# Patient Record
Sex: Female | Born: 1960 | Race: White | Hispanic: No | Marital: Married | Smoking: Never smoker
Health system: Southern US, Community
[De-identification: ages and names within clinical notes are randomized; demographics above are authoritative.]

## PROBLEM LIST (undated history)

## (undated) DIAGNOSIS — G4730 Sleep apnea, unspecified: Secondary | ICD-10-CM

## (undated) DIAGNOSIS — S82892A Other fracture of left lower leg, initial encounter for closed fracture: Secondary | ICD-10-CM

## (undated) DIAGNOSIS — T7840XA Allergy, unspecified, initial encounter: Secondary | ICD-10-CM

## (undated) DIAGNOSIS — R011 Cardiac murmur, unspecified: Secondary | ICD-10-CM

## (undated) DIAGNOSIS — M8580 Other specified disorders of bone density and structure, unspecified site: Secondary | ICD-10-CM

## (undated) DIAGNOSIS — F419 Anxiety disorder, unspecified: Secondary | ICD-10-CM

## (undated) DIAGNOSIS — J45909 Unspecified asthma, uncomplicated: Secondary | ICD-10-CM

## (undated) DIAGNOSIS — I10 Essential (primary) hypertension: Secondary | ICD-10-CM

## (undated) DIAGNOSIS — B019 Varicella without complication: Secondary | ICD-10-CM

## (undated) DIAGNOSIS — S42409A Unspecified fracture of lower end of unspecified humerus, initial encounter for closed fracture: Secondary | ICD-10-CM

## (undated) DIAGNOSIS — E785 Hyperlipidemia, unspecified: Secondary | ICD-10-CM

## (undated) HISTORY — DX: Sleep apnea, unspecified: G47.30

## (undated) HISTORY — DX: Other fracture of left lower leg, initial encounter for closed fracture: S82.892A

## (undated) HISTORY — DX: Other specified disorders of bone density and structure, unspecified site: M85.80

## (undated) HISTORY — DX: Unspecified fracture of lower end of unspecified humerus, initial encounter for closed fracture: S42.409A

## (undated) HISTORY — PX: ELBOW FRACTURE SURGERY: SHX616

## (undated) HISTORY — PX: WISDOM TOOTH EXTRACTION: SHX21

## (undated) HISTORY — DX: Allergy, unspecified, initial encounter: T78.40XA

## (undated) HISTORY — DX: Hyperlipidemia, unspecified: E78.5

## (undated) HISTORY — DX: Cardiac murmur, unspecified: R01.1

## (undated) HISTORY — DX: Anxiety disorder, unspecified: F41.9

## (undated) HISTORY — PX: COLONOSCOPY: SHX174

## (undated) HISTORY — DX: Varicella without complication: B01.9

## (undated) HISTORY — DX: Essential (primary) hypertension: I10

---

## 2011-02-28 LAB — HM MAMMOGRAPHY

## 2012-12-31 ENCOUNTER — Telehealth: Payer: Self-pay | Admitting: Family Medicine

## 2012-12-31 DIAGNOSIS — Z9109 Other allergy status, other than to drugs and biological substances: Secondary | ICD-10-CM

## 2012-12-31 MED ORDER — MONTELUKAST SODIUM 10 MG PO TABS: 10.0000 mg | ORAL_TABLET | Freq: Every day | ORAL | Status: DC

## 2012-12-31 NOTE — Telephone Encounter (Signed)
Rx for Singular sent to [facility]

## 2012-12-31 NOTE — Telephone Encounter (Signed)
Patient wanted to see if we could refill her Cingular 10mg  once a day, until her New Patient appointment on 02/27/2013. thanks  Pharmacy [facility]

## 2013-02-18 ENCOUNTER — Emergency Department (HOSPITAL_COMMUNITY): Payer: Managed Care, Other (non HMO)

## 2013-02-18 ENCOUNTER — Emergency Department (HOSPITAL_COMMUNITY)
Admission: EM | Admit: 2013-02-18 | Discharge: 2013-02-18 | Disposition: A | Discharge status: Home / Self Care | Payer: Managed Care, Other (non HMO) | Attending: Emergency Medicine | Admitting: Emergency Medicine

## 2013-02-18 ENCOUNTER — Encounter (HOSPITAL_COMMUNITY): Payer: Self-pay | Admitting: Emergency Medicine

## 2013-02-18 DIAGNOSIS — Z79899 Other long term (current) drug therapy: Secondary | ICD-10-CM | POA: Insufficient documentation

## 2013-02-18 DIAGNOSIS — Y929 Unspecified place or not applicable: Secondary | ICD-10-CM | POA: Insufficient documentation

## 2013-02-18 DIAGNOSIS — X500XXA Overexertion from strenuous movement or load, initial encounter: Secondary | ICD-10-CM | POA: Insufficient documentation

## 2013-02-18 DIAGNOSIS — Z880 Allergy status to penicillin: Secondary | ICD-10-CM | POA: Insufficient documentation

## 2013-02-18 DIAGNOSIS — J45909 Unspecified asthma, uncomplicated: Secondary | ICD-10-CM | POA: Insufficient documentation

## 2013-02-18 DIAGNOSIS — IMO0002 Reserved for concepts with insufficient information to code with codable children: Secondary | ICD-10-CM | POA: Insufficient documentation

## 2013-02-18 DIAGNOSIS — Y9301 Activity, walking, marching and hiking: Secondary | ICD-10-CM | POA: Insufficient documentation

## 2013-02-18 DIAGNOSIS — S82892A Other fracture of left lower leg, initial encounter for closed fracture: Secondary | ICD-10-CM

## 2013-02-18 DIAGNOSIS — S8263XA Displaced fracture of lateral malleolus of unspecified fibula, initial encounter for closed fracture: Secondary | ICD-10-CM | POA: Insufficient documentation

## 2013-02-18 HISTORY — DX: Unspecified asthma, uncomplicated: J45.909

## 2013-02-18 MED ORDER — OXYCODONE-ACETAMINOPHEN 5-325 MG PO TABS
1.0000 | ORAL_TABLET | Freq: Once | ORAL | Status: AC
  Administered 2013-02-18: 1 via ORAL
  Filled 2013-02-18: qty 1

## 2013-02-18 MED ORDER — OXYCODONE-ACETAMINOPHEN 5-325 MG PO TABS: 1.0000 | ORAL_TABLET | Freq: Four times a day (QID) | ORAL | Status: DC | PRN

## 2013-02-18 NOTE — ED Provider Notes (Signed)
Medical screening examination/treatment/procedure(s) were performed by non-physician practitioner and as supervising physician I was immediately available for consultation/collaboration.  EKG Interpretation   None         Adyn Serna M Efrat Zuidema, DO 02/18/13 1710 

## 2013-02-18 NOTE — Progress Notes (Signed)
Orthopedic Tech Progress Note Patient Details:  Sandy Green [birth year] [id number] Post. Short leg splint applied to Left LE. Crutches fit for comfort.  Ortho Devices Type of Ortho Device: Crutches;Post (short leg) splint Ortho Device/Splint Interventions: Application   Asia R Thompson 02/18/2013, 3:25 PM

## 2013-02-18 NOTE — ED Notes (Signed)
Pt c/o left ankle pain after tripping and twisting today; CMS intact

## 2013-02-18 NOTE — ED Provider Notes (Signed)
CSN: [id number]     Arrival date & time 02/18/13  1310 History  This chart was scribed for non-physician practitioner Fayrene Helper, PA-C working with Laray Anger, DO by Leone Payor, ED Scribe. This patient was seen in room TR05C/TR05C and the patient's care was started at 1310.    Chief Complaint  Patient presents with  . Ankle Pain    The history is provided by the patient. No language interpreter was used.     HPI Comments: Sandy Green is a 52 y.o. female who presents to the Emergency Department complaining of a left ankle injury that occurred about 3 hours ago. Pt states she was walking when she tripped and twisted her ankle. She denies any other injuries at this time. She has not taken any OTC pain medications for her symptoms. She denies lightheadedness, dizziness, LOC, knee pain.   Past Medical History  Diagnosis Date  . Asthma    History reviewed. No pertinent past surgical history. History reviewed. No pertinent family history. History  Substance Use Topics  . Smoking status: Never Smoker   . Smokeless tobacco: Not on file  . Alcohol Use: No   OB History   Grav Para Term Preterm Abortions TAB SAB Ect Mult Living                 Review of Systems  Musculoskeletal: Positive for arthralgias (left ankle pain).  Neurological: Negative for weakness and numbness.    Allergies  Penicillins  Home Medications   Current Outpatient Rx  Name  Route  Sig  Dispense  Refill  . atorvastatin (LIPITOR) 10 MG tablet   Oral   Take 10 mg by mouth daily.         Marland Kitchen escitalopram (LEXAPRO) 10 MG tablet   Oral   Take 10 mg by mouth daily.         . fluticasone (FLOVENT HFA) 220 MCG/ACT inhaler   Inhalation   Inhale 1 puff into the lungs 2 (two) times daily.         Marland Kitchen loratadine (CLARITIN) 10 MG tablet   Oral   Take 10 mg by mouth daily.         . montelukast (SINGULAIR) 10 MG tablet   Oral   Take 1 tablet (10 mg total) by mouth at bedtime.   30 tablet   1    . Norgestimate-Ethinyl Estradiol Triphasic (ORTHO TRI-CYCLEN LO) 0.18/0.215/0.25 MG-25 MCG tab   Oral   Take 1 tablet by mouth daily.          BP 150/79  Pulse 77  Temp(Src) 98 F (36.7 C) (Oral)  Resp 18  Wt 190 lb (86.183 kg)  SpO2 99% Physical Exam  Nursing note and vitals reviewed. Constitutional: She is oriented to person, place, and time. She appears well-developed and well-nourished.  HENT:  Head: Normocephalic and atraumatic.  Cardiovascular: Normal rate.   Pulmonary/Chest: Effort normal.  Abdominal: She exhibits no distension.  Musculoskeletal:  Left ankle: tenderness to palpation to lateral aspect with moderate swelling. Without obvious deformity. No tenderness to medial or posterior malleolus. No tenderness to the 5th MTP. Pedal pulses intact with brisk cap refill.   Neurological: She is alert and oriented to person, place, and time.  Skin: Skin is warm and dry.  Psychiatric: She has a normal mood and affect.    ED Course  Procedures   DIAGNOSTIC STUDIES: Oxygen Saturation is 99% on RA, normal by my interpretation.    COORDINATION  OF CARE: 2:52 PM Will order XRAY of the left ankle. Discussed treatment plan with pt at bedside and pt agreed to plan.    3:15 PM Xray of L ankle shows nondisplaced transverse fx through the lateral malleolus just below the level of the ankle mortise.  This is a stable closed fracture. NVI. Will place short leg splint, crutches, and close f/u with ortho.  Pain medication given.  Return precaution given.     Labs Review Labs Reviewed - No data to display Imaging Review Dg Ankle Complete Left  02/18/2013   CLINICAL DATA:  Larey Seat.  Injured left ankle.  EXAM: LEFT ANKLE COMPLETE - 3+ VIEW  COMPARISON:  None.  FINDINGS: There is a nondisplaced transverse fracture of the lateral malleolus just below the level of the ankle mortise. The tibia is intact. No talar fractures. No osteochondral abnormality. The visualized mid and hindfoot bony  structures are intact.  IMPRESSION: Nondisplaced transverse fracture through the lateral malleolus just below the level of the ankle mortise.   Electronically Signed   By: Loralie Champagne M.D.   On: 02/18/2013 14:08    EKG Interpretation   None       MDM   1. Closed left ankle fracture, initial encounter    BP 150/79  Pulse 77  Temp(Src) 98 F (36.7 C) (Oral)  Resp 18  Wt 190 lb (86.183 kg)  SpO2 99%  I have reviewed nursing notes and vital signs. I personally reviewed the imaging tests through PACS system  I reviewed available ER/hospitalization records thought the EMR   I personally performed the services described in this documentation, which was scribed in my presence. The recorded information has been reviewed and is accurate.    Fayrene Helper, PA-C 02/18/13 [phone]

## 2013-02-26 ENCOUNTER — Telehealth: Payer: Self-pay

## 2013-02-26 NOTE — Telephone Encounter (Signed)
Left message for call back  identifiable     

## 2013-02-27 ENCOUNTER — Encounter: Payer: Self-pay | Admitting: Family Medicine

## 2013-02-27 ENCOUNTER — Ambulatory Visit (INDEPENDENT_AMBULATORY_CARE_PROVIDER_SITE_OTHER): Payer: Managed Care, Other (non HMO) | Admitting: Family Medicine

## 2013-02-27 VITALS — BP 130/82 | HR 89 | Resp 16 | Ht 66.0 in | Wt 195.8 lb

## 2013-02-27 DIAGNOSIS — E785 Hyperlipidemia, unspecified: Secondary | ICD-10-CM

## 2013-02-27 DIAGNOSIS — J4520 Mild intermittent asthma, uncomplicated: Secondary | ICD-10-CM

## 2013-02-27 DIAGNOSIS — Z1239 Encounter for other screening for malignant neoplasm of breast: Secondary | ICD-10-CM

## 2013-02-27 DIAGNOSIS — J45909 Unspecified asthma, uncomplicated: Secondary | ICD-10-CM

## 2013-02-27 DIAGNOSIS — F411 Generalized anxiety disorder: Secondary | ICD-10-CM

## 2013-02-27 DIAGNOSIS — E669 Obesity, unspecified: Secondary | ICD-10-CM | POA: Insufficient documentation

## 2013-02-27 DIAGNOSIS — Z9109 Other allergy status, other than to drugs and biological substances: Secondary | ICD-10-CM

## 2013-02-27 DIAGNOSIS — Z889 Allergy status to unspecified drugs, medicaments and biological substances status: Secondary | ICD-10-CM

## 2013-02-27 DIAGNOSIS — Z0000 Encounter for general adult medical examination without abnormal findings: Secondary | ICD-10-CM

## 2013-02-27 MED ORDER — ESCITALOPRAM OXALATE 10 MG PO TABS: 10.0000 mg | ORAL_TABLET | Freq: Every day | ORAL | Status: DC

## 2013-02-27 MED ORDER — MONTELUKAST SODIUM 10 MG PO TABS: 10.0000 mg | ORAL_TABLET | Freq: Every day | ORAL | Status: DC

## 2013-02-27 NOTE — Progress Notes (Signed)
Subjective:     Sandy Green is a 52 y.o. female and is here for a comprehensive physical exam. The patient reports problems - fractured L ankle-- sees Dr Eulah Pont.  History   Social History  . Marital Status: Married    Spouse Name: N/A    Number of Children: N/A  . Years of Education: N/A   Occupational History  . Not on file.   Social History Main Topics  . Smoking status: Never Smoker   . Smokeless tobacco: Not on file  . Alcohol Use: No  . Drug Use: No  . Sexual Activity: Yes    Partners: Male   Other Topics Concern  . Not on file   Social History Narrative   Exercise-- no    Health Maintenance  Topic Date Due  . Mammogram  05/22/2012  . Colonoscopy  05/22/2012  . Influenza Vaccine  11/02/2013  . Pap Smear  03/05/2015  . Tetanus/tdap  02/27/2018    The following portions of the patient's history were reviewed and updated as appropriate:  She  has a past medical history of Asthma; Chicken pox; Heart murmur; Hypertension; Hyperlipemia; and Ankle fracture, left. She  does not have any pertinent problems on file. She  has no past surgical history on file. Her family history includes Cancer in her maternal grandmother; Diabetes in her maternal grandmother; Heart disease in her father; Hypertension in her father and mother. She  reports that she has never smoked. She does not have any smokeless tobacco history on file. She reports that she does not drink alcohol or use illicit drugs. She has a current medication list which includes the following prescription(s): atorvastatin, escitalopram, fluticasone, loratadine, montelukast, norethindrone-ethinyl estradiol-iron, and oxycodone-acetaminophen. Current Outpatient Prescriptions on File Prior to Visit  Medication Sig Dispense Refill  . atorvastatin (LIPITOR) 10 MG tablet Take 10 mg by mouth daily.      . fluticasone (FLOVENT HFA) 220 MCG/ACT inhaler Inhale 1 puff into the lungs 2 (two) times daily.      Marland Kitchen loratadine  (CLARITIN) 10 MG tablet Take 10 mg by mouth daily.      Marland Kitchen oxyCODONE-acetaminophen (PERCOCET/ROXICET) 5-325 MG per tablet Take 1 tablet by mouth every 6 (six) hours as needed for severe pain.  15 tablet  0   No current facility-administered medications on file prior to visit.   She is allergic to penicillins and tetracyclines & related..  Review of Systems Review of Systems  Constitutional: Negative for activity change, appetite change and fatigue.  HENT: Negative for hearing loss, congestion, tinnitus and ear discharge.  dentist --due Eyes: Negative for visual disturbance (see optho q1y -- vision corrected to 20/20 with glasses).  Respiratory: Negative for cough, chest tightness and shortness of breath.   Cardiovascular: Negative for chest pain, palpitations and leg swelling.  Gastrointestinal: Negative for abdominal pain, diarrhea, constipation and abdominal distention.  Genitourinary: Negative for urgency, frequency, decreased urine volume and difficulty urinating.  Musculoskeletal: Negative for back pain, arthralgias and gait problem.  Skin: Negative for color change, pallor and rash.  Neurological: Negative for dizziness, light-headedness, numbness and headaches.  Hematological: Negative for adenopathy. Does not bruise/bleed easily.  Psychiatric/Behavioral: Negative for suicidal ideas, confusion, sleep disturbance, self-injury, dysphoric mood, decreased concentration and agitation.       Objective:    BP 130/82  Pulse 89  Resp 16  Ht 5\' 6"  (1.676 m)  Wt 195 lb 12.8 oz (88.814 kg)  BMI 31.62 kg/m2  SpO2 96% General appearance: alert,  cooperative, appears stated age and no distress Head: Normocephalic, without obvious abnormality, atraumatic Eyes: conjunctivae/corneas clear. PERRL, EOM's intact. Fundi benign. Ears: normal TM's and external ear canals both ears Nose: Nares normal. Septum midline. Mucosa normal. No drainage or sinus tenderness. Throat: lips, mucosa, and tongue  normal; teeth and gums normal Neck: no adenopathy, no carotid bruit, no JVD, supple, symmetrical, trachea midline and thyroid not enlarged, symmetric, no tenderness/mass/nodules Back: symmetric, no curvature. ROM normal. No CVA tenderness. Lungs: clear to auscultation bilaterally Breasts: normal appearance, no masses or tenderness Heart: regular rate and rhythm, S1, S2 normal, no murmur, click, rub or gallop Abdomen: soft, non-tender; bowel sounds normal; no masses,  no organomegaly Pelvic: deferred Extremities: fractured L ankle--in boot Pulses: 2+ and symmetric Skin: Skin color, texture, turgor normal. No rashes or lesions Lymph nodes: Cervical, supraclavicular, and axillary nodes normal. Neurologic: Alert and oriented X 3, normal strength and tone. Normal symmetric reflexes. Normal coordination and gait Psych-- no depression, no anxiety      Assessment:    Healthy female exam.      Plan:    ghm utd Check labs See After Visit Summary for Counseling Recommendations

## 2013-02-27 NOTE — Telephone Encounter (Signed)
Unable to reach pre visit.  

## 2013-02-27 NOTE — Patient Instructions (Signed)
Preventive Care for Adults, Female A healthy lifestyle and preventive care can promote health and wellness. Preventive health guidelines for women include the following key practices.  A routine yearly physical is a good way to check with your caregiver about your health and preventive screening. It is a chance to share any concerns and updates on your health, and to receive a thorough exam.  Visit your dentist for a routine exam and preventive care every 6 months. Brush your teeth twice a day and floss once a day. Good oral hygiene prevents tooth decay and gum disease.  The frequency of eye exams is based on your age, health, family medical history, use of contact lenses, and other factors. Follow your caregiver's recommendations for frequency of eye exams.  Eat a healthy diet. Foods like vegetables, fruits, whole grains, low-fat dairy products, and lean protein foods contain the nutrients you need without too many calories. Decrease your intake of foods high in solid fats, added sugars, and salt. Eat the right amount of calories for you.Get information about a proper diet from your caregiver, if necessary.  Regular physical exercise is one of the most important things you can do for your health. Most adults should get at least 150 minutes of moderate-intensity exercise (any activity that increases your heart rate and causes you to sweat) each week. In addition, most adults need muscle-strengthening exercises on 2 or more days a week.  Maintain a healthy weight. The body mass index (BMI) is a screening tool to identify possible weight problems. It provides an estimate of body fat based on height and weight. Your caregiver can help determine your BMI, and can help you achieve or maintain a healthy weight.For adults 20 years and older:  A BMI below 18.5 is considered underweight.  A BMI of 18.5 to 24.9 is normal.  A BMI of 25 to 29.9 is considered overweight.  A BMI of 30 and above is  considered obese.  Maintain normal blood lipids and cholesterol levels by exercising and minimizing your intake of saturated fat. Eat a balanced diet with plenty of fruit and vegetables. Blood tests for lipids and cholesterol should begin at age 20 and be repeated every 5 years. If your lipid or cholesterol levels are high, you are over 50, or you are at high risk for heart disease, you may need your cholesterol levels checked more frequently.Ongoing high lipid and cholesterol levels should be treated with medicines if diet and exercise are not effective.  If you smoke, find out from your caregiver how to quit. If you do not use tobacco, do not start.  Lung cancer screening is recommended for adults aged 55 80 years who are at high risk for developing lung cancer because of a history of smoking. Yearly low-dose computed tomography (CT) is recommended for people who have at least a 30-pack-year history of smoking and are a current smoker or have quit within the past 15 years. A pack year of smoking is smoking an average of 1 pack of cigarettes a day for 1 year (for example: 1 pack a day for 30 years or 2 packs a day for 15 years). Yearly screening should continue until the smoker has stopped smoking for at least 15 years. Yearly screening should also be stopped for people who develop a health problem that would prevent them from having lung cancer treatment.  If you are pregnant, do not drink alcohol. If you are breastfeeding, be very cautious about drinking alcohol. If you are   not pregnant and choose to drink alcohol, do not exceed 1 drink per day. One drink is considered to be 12 ounces (355 mL) of beer, 5 ounces (148 mL) of wine, or 1.5 ounces (44 mL) of liquor.  Avoid use of street drugs. Do not share needles with anyone. Ask for help if you need support or instructions about stopping the use of drugs.  High blood pressure causes heart disease and increases the risk of stroke. Your blood pressure  should be checked at least every 1 to 2 years. Ongoing high blood pressure should be treated with medicines if weight loss and exercise are not effective.  If you are 55 to 50 years old, ask your caregiver if you should take aspirin to prevent strokes.  Diabetes screening involves taking a blood sample to check your fasting blood sugar level. This should be done once every 3 years, after age 45, if you are within normal weight and without risk factors for diabetes. Testing should be considered at a younger age or be carried out more frequently if you are overweight and have at least 1 risk factor for diabetes.  Breast cancer screening is essential preventive care for women. You should practice "breast self-awareness." This means understanding the normal appearance and feel of your breasts and may include breast self-examination. Any changes detected, no matter how small, should be reported to a caregiver. Women in their 20s and 30s should have a clinical breast exam (CBE) by a caregiver as part of a regular health exam every 1 to 3 years. After age 40, women should have a CBE every year. Starting at age 40, women should consider having a mammography (breast X-ray test) every year. Women who have a family history of breast cancer should talk to their caregiver about genetic screening. Women at a high risk of breast cancer should talk to their caregivers about having magnetic resonance imaging (MRI) and a mammography every year.  Breast cancer gene (BRCA)-related cancer risk assessment is recommended for women who have family members with BRCA-related cancers. BRCA-related cancers include breast, ovarian, tubal, and peritoneal cancers. Having family members with these cancers may be associated with an increased risk for harmful changes (mutations) in the breast cancer genes BRCA1 and BRCA2. Results of the assessment will determine the need for genetic counseling and BRCA1 and BRCA2 testing.  The Pap test is  a screening test for cervical cancer. A Pap test can show cell changes on the cervix that might become cervical cancer if left untreated. A Pap test is a procedure in which cells are obtained and examined from the lower end of the uterus (cervix).  Women should have a Pap test starting at age 21.  Between ages 21 and 29, Pap tests should be repeated every 2 years.  Beginning at age 30, you should have a Pap test every 3 years as long as the past 3 Pap tests have been normal.  Some women have medical problems that increase the chance of getting cervical cancer. Talk to your caregiver about these problems. It is especially important to talk to your caregiver if a new problem develops soon after your last Pap test. In these cases, your caregiver may recommend more frequent screening and Pap tests.  The above recommendations are the same for women who have or have not gotten the vaccine for human papillomavirus (HPV).  If you had a hysterectomy for a problem that was not cancer or a condition that could lead to cancer, then   you no longer need Pap tests. Even if you no longer need a Pap test, a regular exam is a good idea to make sure no other problems are starting.  If you are between ages 65 and 70, and you have had normal Pap tests going back 10 years, you no longer need Pap tests. Even if you no longer need a Pap test, a regular exam is a good idea to make sure no other problems are starting.  If you have had past treatment for cervical cancer or a condition that could lead to cancer, you need Pap tests and screening for cancer for at least 20 years after your treatment.  If Pap tests have been discontinued, risk factors (such as a new sexual partner) need to be reassessed to determine if screening should be resumed.  The HPV test is an additional test that may be used for cervical cancer screening. The HPV test looks for the virus that can cause the cell changes on the cervix. The cells collected  during the Pap test can be tested for HPV. The HPV test could be used to screen women aged 30 years and older, and should be used in women of any age who have unclear Pap test results. After the age of 30, women should have HPV testing at the same frequency as a Pap test.  Colorectal cancer can be detected and often prevented. Most routine colorectal cancer screening begins at the age of 50 and continues through age 75. However, your caregiver may recommend screening at an earlier age if you have risk factors for colon cancer. On a yearly basis, your caregiver may provide home test kits to check for hidden blood in the stool. Use of a small camera at the end of a tube, to directly examine the colon (sigmoidoscopy or colonoscopy), can detect the earliest forms of colorectal cancer. Talk to your caregiver about this at age 50, when routine screening begins. Direct examination of the colon should be repeated every 5 to 10 years through age 75, unless early forms of pre-cancerous polyps or small growths are found.  Hepatitis C blood testing is recommended for all people born from 1945 through 1965 and any individual with known risks for hepatitis C.  Practice safe sex. Use condoms and avoid high-risk sexual practices to reduce the spread of sexually transmitted infections (STIs). STIs include gonorrhea, chlamydia, syphilis, trichomonas, herpes, HPV, and human immunodeficiency virus (HIV). Herpes, HIV, and HPV are viral illnesses that have no cure. They can result in disability, cancer, and death. Sexually active women aged 25 and younger should be checked for chlamydia. Older women with new or multiple partners should also be tested for chlamydia. Testing for other STIs is recommended if you are sexually active and at increased risk.  Osteoporosis is a disease in which the bones lose minerals and strength with aging. This can result in serious bone fractures. The risk of osteoporosis can be identified using a  bone density scan. Women ages 65 and over and women at risk for fractures or osteoporosis should discuss screening with their caregivers. Ask your caregiver whether you should take a calcium supplement or vitamin D to reduce the rate of osteoporosis.  Menopause can be associated with physical symptoms and risks. Hormone replacement therapy is available to decrease symptoms and risks. You should talk to your caregiver about whether hormone replacement therapy is right for you.  Use sunscreen. Apply sunscreen liberally and repeatedly throughout the day. You should seek shade   when your shadow is shorter than you. Protect yourself by wearing long sleeves, pants, a wide-brimmed hat, and sunglasses year round, whenever you are outdoors.  Once a month, do a whole body skin exam, using a mirror to look at the skin on your back. Notify your caregiver of new moles, moles that have irregular borders, moles that are larger than a pencil eraser, or moles that have changed in shape or color.  Stay current with required immunizations.  Influenza vaccine. All adults should be immunized every year.  Tetanus, diphtheria, and acellular pertussis (Td, Tdap) vaccine. Pregnant women should receive 1 dose of Tdap vaccine during each pregnancy. The dose should be obtained regardless of the length of time since the last dose. Immunization is preferred during the 27th to 36th week of gestation. An adult who has not previously received Tdap or who does not know her vaccine status should receive 1 dose of Tdap. This initial dose should be followed by tetanus and diphtheria toxoids (Td) booster doses every 10 years. Adults with an unknown or incomplete history of completing a 3-dose immunization series with Td-containing vaccines should begin or complete a primary immunization series including a Tdap dose. Adults should receive a Td booster every 10 years.  Varicella vaccine. An adult without evidence of immunity to varicella  should receive 2 doses or a second dose if she has previously received 1 dose. Pregnant females who do not have evidence of immunity should receive the first dose after pregnancy. This first dose should be obtained before leaving the health care facility. The second dose should be obtained 4 8 weeks after the first dose.  Human papillomavirus (HPV) vaccine. Females aged 13 26 years who have not received the vaccine previously should obtain the 3-dose series. The vaccine is not recommended for use in pregnant females. However, pregnancy testing is not needed before receiving a dose. If a female is found to be pregnant after receiving a dose, no treatment is needed. In that case, the remaining doses should be delayed until after the pregnancy. Immunization is recommended for any person with an immunocompromised condition through the age of 26 years if she did not get any or all doses earlier. During the 3-dose series, the second dose should be obtained 4 8 weeks after the first dose. The third dose should be obtained 24 weeks after the first dose and 16 weeks after the second dose.  Zoster vaccine. One dose is recommended for adults aged 60 years or older unless certain conditions are present.  Measles, mumps, and rubella (MMR) vaccine. Adults born before 1957 generally are considered immune to measles and mumps. Adults born in 1957 or later should have 1 or more doses of MMR vaccine unless there is a contraindication to the vaccine or there is laboratory evidence of immunity to each of the three diseases. A routine second dose of MMR vaccine should be obtained at least 28 days after the first dose for students attending postsecondary schools, health care workers, or international travelers. People who received inactivated measles vaccine or an unknown type of measles vaccine during 1963 1967 should receive 2 doses of MMR vaccine. People who received inactivated mumps vaccine or an unknown type of mumps vaccine  before 1979 and are at high risk for mumps infection should consider immunization with 2 doses of MMR vaccine. For females of childbearing age, rubella immunity should be determined. If there is no evidence of immunity, females who are not pregnant should be vaccinated. If there   is no evidence of immunity, females who are pregnant should delay immunization until after pregnancy. Unvaccinated health care workers born before 1957 who lack laboratory evidence of measles, mumps, or rubella immunity or laboratory confirmation of disease should consider measles and mumps immunization with 2 doses of MMR vaccine or rubella immunization with 1 dose of MMR vaccine.  Pneumococcal 13-valent conjugate (PCV13) vaccine. When indicated, a person who is uncertain of her immunization history and has no record of immunization should receive the PCV13 vaccine. An adult aged 19 years or older who has certain medical conditions and has not been previously immunized should receive 1 dose of PCV13 vaccine. This PCV13 should be followed with a dose of pneumococcal polysaccharide (PPSV23) vaccine. The PPSV23 vaccine dose should be obtained at least 8 weeks after the dose of PCV13 vaccine. An adult aged 19 years or older who has certain medical conditions and previously received 1 or more doses of PPSV23 vaccine should receive 1 dose of PCV13. The PCV13 vaccine dose should be obtained 1 or more years after the last PPSV23 vaccine dose.  Pneumococcal polysaccharide (PPSV23) vaccine. When PCV13 is also indicated, PCV13 should be obtained first. All adults aged 65 years and older should be immunized. An adult younger than age 65 years who has certain medical conditions should be immunized. Any person who resides in a nursing home or long-term care facility should be immunized. An adult smoker should be immunized. People with an immunocompromised condition and certain other conditions should receive both PCV13 and PPSV23 vaccines. People  with human immunodeficiency virus (HIV) infection should be immunized as soon as possible after diagnosis. Immunization during chemotherapy or radiation therapy should be avoided. Routine use of PPSV23 vaccine is not recommended for American Indians, [place] Natives, or people younger than 65 years unless there are medical conditions that require PPSV23 vaccine. When indicated, people who have unknown immunization and have no record of immunization should receive PPSV23 vaccine. One-time revaccination 5 years after the first dose of PPSV23 is recommended for people aged 19 64 years who have chronic kidney failure, nephrotic syndrome, asplenia, or immunocompromised conditions. People who received 1 2 doses of PPSV23 before age 65 years should receive another dose of PPSV23 vaccine at age 65 years or later if at least 5 years have passed since the previous dose. Doses of PPSV23 are not needed for people immunized with PPSV23 at or after age 65 years.  Meningococcal vaccine. Adults with asplenia or persistent complement component deficiencies should receive 2 doses of quadrivalent meningococcal conjugate (MenACWY-D) vaccine. The doses should be obtained at least 2 months apart. Microbiologists working with certain meningococcal bacteria, military recruits, people at risk during an outbreak, and people who travel to or live in countries with a high rate of meningitis should be immunized. A first-year college student up through age 21 years who is living in a residence hall should receive a dose if she did not receive a dose on or after her 16th birthday. Adults who have certain high-risk conditions should receive one or more doses of vaccine.  Hepatitis A vaccine. Adults who wish to be protected from this disease, have certain high-risk conditions, work with hepatitis A-infected animals, work in hepatitis A research labs, or travel to or work in countries with a high rate of hepatitis A should be immunized. Adults  who were previously unvaccinated and who anticipate close contact with an international adoptee during the first 60 days after arrival in the [place] from a country   with a high rate of hepatitis A should be immunized.  Hepatitis B vaccine. Adults who wish to be protected from this disease, have certain high-risk conditions, may be exposed to blood or other infectious body fluids, are household contacts or sex partners of hepatitis B positive people, are clients or workers in certain care facilities, or travel to or work in countries with a high rate of hepatitis B should be immunized.  Haemophilus influenzae type b (Hib) vaccine. A previously unvaccinated person with asplenia or sickle cell disease or having a scheduled splenectomy should receive 1 dose of Hib vaccine. Regardless of previous immunization, a recipient of a hematopoietic stem cell transplant should receive a 3-dose series 6 12 months after her successful transplant. Hib vaccine is not recommended for adults with HIV infection. Preventive Services / Frequency Ages 19 to 39  Blood pressure check.** / Every 1 to 2 years.  Lipid and cholesterol check.** / Every 5 years beginning at age 20.  Clinical breast exam.** / Every 3 years for women in their 20s and 30s.  BRCA-related cancer risk assessment.** / For women who have family members with a BRCA-related cancer (breast, ovarian, tubal, or peritoneal cancers).  Pap test.** / Every 2 years from ages 21 through 29. Every 3 years starting at age 30 through age 65 or 70 with a history of 3 consecutive normal Pap tests.  HPV screening.** / Every 3 years from ages 30 through ages 65 to 70 with a history of 3 consecutive normal Pap tests.  Hepatitis C blood test.** / For any individual with known risks for hepatitis C.  Skin self-exam. / Monthly.  Influenza vaccine. / Every year.  Tetanus, diphtheria, and acellular pertussis (Tdap, Td) vaccine.** / Consult your caregiver. Pregnant  women should receive 1 dose of Tdap vaccine during each pregnancy. 1 dose of Td every 10 years.  Varicella vaccine.** / Consult your caregiver. Pregnant females who do not have evidence of immunity should receive the first dose after pregnancy.  HPV vaccine. / 3 doses over 6 months, if 26 and younger. The vaccine is not recommended for use in pregnant females. However, pregnancy testing is not needed before receiving a dose.  Measles, mumps, rubella (MMR) vaccine.** / You need at least 1 dose of MMR if you were born in 1957 or later. You may also need a 2nd dose. For females of childbearing age, rubella immunity should be determined. If there is no evidence of immunity, females who are not pregnant should be vaccinated. If there is no evidence of immunity, females who are pregnant should delay immunization until after pregnancy.  Pneumococcal 13-valent conjugate (PCV13) vaccine.** / Consult your caregiver.  Pneumococcal polysaccharide (PPSV23) vaccine.** / 1 to 2 doses if you smoke cigarettes or if you have certain conditions.  Meningococcal vaccine.** / 1 dose if you are age 19 to 21 years and a first-year college student living in a residence hall, or have one of several medical conditions, you need to get vaccinated against meningococcal disease. You may also need additional booster doses.  Hepatitis A vaccine.** / Consult your caregiver.  Hepatitis B vaccine.** / Consult your caregiver.  Haemophilus influenzae type b (Hib) vaccine.** / Consult your caregiver. Ages 40 to 64  Blood pressure check.** / Every 1 to 2 years.  Lipid and cholesterol check.** / Every 5 years beginning at age 20.  Lung cancer screening. / Every year if you are aged 55 80 years and have a 30-pack-year history of smoking and   currently smoke or have quit within the past 15 years. Yearly screening is stopped once you have quit smoking for at least 15 years or develop a health problem that would prevent you from having  lung cancer treatment.  Clinical breast exam.** / Every year after age 40.  BRCA-related cancer risk assessment.** / For women who have family members with a BRCA-related cancer (breast, ovarian, tubal, or peritoneal cancers).  Mammogram.** / Every year beginning at age 40 and continuing for as long as you are in good health. Consult with your caregiver.  Pap test.** / Every 3 years starting at age 30 through age 65 or 70 with a history of 3 consecutive normal Pap tests.  HPV screening.** / Every 3 years from ages 30 through ages 65 to 70 with a history of 3 consecutive normal Pap tests.  Fecal occult blood test (FOBT) of stool. / Every year beginning at age 50 and continuing until age 75. You may not need to do this test if you get a colonoscopy every 10 years.  Flexible sigmoidoscopy or colonoscopy.** / Every 5 years for a flexible sigmoidoscopy or every 10 years for a colonoscopy beginning at age 50 and continuing until age 75.  Hepatitis C blood test.** / For all people born from 1945 through 1965 and any individual with known risks for hepatitis C.  Skin self-exam. / Monthly.  Influenza vaccine. / Every year.  Tetanus, diphtheria, and acellular pertussis (Tdap/Td) vaccine.** / Consult your caregiver. Pregnant women should receive 1 dose of Tdap vaccine during each pregnancy. 1 dose of Td every 10 years.  Varicella vaccine.** / Consult your caregiver. Pregnant females who do not have evidence of immunity should receive the first dose after pregnancy.  Zoster vaccine.** / 1 dose for adults aged 60 years or older.  Measles, mumps, rubella (MMR) vaccine.** / You need at least 1 dose of MMR if you were born in 1957 or later. You may also need a 2nd dose. For females of childbearing age, rubella immunity should be determined. If there is no evidence of immunity, females who are not pregnant should be vaccinated. If there is no evidence of immunity, females who are pregnant should delay  immunization until after pregnancy.  Pneumococcal 13-valent conjugate (PCV13) vaccine.** / Consult your caregiver.  Pneumococcal polysaccharide (PPSV23) vaccine.** / 1 to 2 doses if you smoke cigarettes or if you have certain conditions.  Meningococcal vaccine.** / Consult your caregiver.  Hepatitis A vaccine.** / Consult your caregiver.  Hepatitis B vaccine.** / Consult your caregiver.  Haemophilus influenzae type b (Hib) vaccine.** / Consult your caregiver. Ages 65 and over  Blood pressure check.** / Every 1 to 2 years.  Lipid and cholesterol check.** / Every 5 years beginning at age 20.  Lung cancer screening. / Every year if you are aged 55 80 years and have a 30-pack-year history of smoking and currently smoke or have quit within the past 15 years. Yearly screening is stopped once you have quit smoking for at least 15 years or develop a health problem that would prevent you from having lung cancer treatment.  Clinical breast exam.** / Every year after age 40.  BRCA-related cancer risk assessment.** / For women who have family members with a BRCA-related cancer (breast, ovarian, tubal, or peritoneal cancers).  Mammogram.** / Every year beginning at age 40 and continuing for as long as you are in good health. Consult with your caregiver.  Pap test.** / Every 3 years starting at age   30 through age 65 or 70 with a 3 consecutive normal Pap tests. Testing can be stopped between 65 and 70 with 3 consecutive normal Pap tests and no abnormal Pap or HPV tests in the past 10 years.  HPV screening.** / Every 3 years from ages 30 through ages 65 or 70 with a history of 3 consecutive normal Pap tests. Testing can be stopped between 65 and 70 with 3 consecutive normal Pap tests and no abnormal Pap or HPV tests in the past 10 years.  Fecal occult blood test (FOBT) of stool. / Every year beginning at age 50 and continuing until age 75. You may not need to do this test if you get a colonoscopy  every 10 years.  Flexible sigmoidoscopy or colonoscopy.** / Every 5 years for a flexible sigmoidoscopy or every 10 years for a colonoscopy beginning at age 50 and continuing until age 75.  Hepatitis C blood test.** / For all people born from 1945 through 1965 and any individual with known risks for hepatitis C.  Osteoporosis screening.** / A one-time screening for women ages 65 and over and women at risk for fractures or osteoporosis.  Skin self-exam. / Monthly.  Influenza vaccine. / Every year.  Tetanus, diphtheria, and acellular pertussis (Tdap/Td) vaccine.** / 1 dose of Td every 10 years.  Varicella vaccine.** / Consult your caregiver.  Zoster vaccine.** / 1 dose for adults aged 60 years or older.  Pneumococcal 13-valent conjugate (PCV13) vaccine.** / Consult your caregiver.  Pneumococcal polysaccharide (PPSV23) vaccine.** / 1 dose for all adults aged 65 years and older.  Meningococcal vaccine.** / Consult your caregiver.  Hepatitis A vaccine.** / Consult your caregiver.  Hepatitis B vaccine.** / Consult your caregiver.  Haemophilus influenzae type b (Hib) vaccine.** / Consult your caregiver. ** Family history and personal history of risk and conditions may change your caregiver's recommendations. Document Released: 05/17/2001 Document Revised: 07/16/2012 Document Reviewed: 08/16/2010 Exit[employer] Patient Information 2014 [employer].  

## 2013-02-27 NOTE — Progress Notes (Signed)
Pre visit review using our clinic review tool, if applicable. No additional management support is needed unless otherwise documented below in the visit note. 

## 2013-02-28 DIAGNOSIS — E785 Hyperlipidemia, unspecified: Secondary | ICD-10-CM | POA: Insufficient documentation

## 2013-02-28 DIAGNOSIS — Z889 Allergy status to unspecified drugs, medicaments and biological substances status: Secondary | ICD-10-CM | POA: Insufficient documentation

## 2013-02-28 DIAGNOSIS — J4520 Mild intermittent asthma, uncomplicated: Secondary | ICD-10-CM | POA: Insufficient documentation

## 2013-02-28 NOTE — Assessment & Plan Note (Signed)
Check labs con't meds 

## 2013-02-28 NOTE — Assessment & Plan Note (Signed)
con't meds stable 

## 2013-04-12 ENCOUNTER — Ambulatory Visit
Admission: RE | Admit: 2013-04-12 | Discharge: 2013-04-12 | Disposition: A | Discharge status: Home / Self Care | Payer: Managed Care, Other (non HMO) | Source: Ambulatory Visit | Attending: Family Medicine | Admitting: Family Medicine

## 2013-04-12 DIAGNOSIS — Z1239 Encounter for other screening for malignant neoplasm of breast: Secondary | ICD-10-CM

## 2013-04-18 ENCOUNTER — Encounter: Payer: Self-pay | Admitting: Family Medicine

## 2013-04-24 ENCOUNTER — Telehealth: Payer: Self-pay | Admitting: Family Medicine

## 2013-04-24 NOTE — Telephone Encounter (Signed)
Patient needs her Microgestin Rx refilled to [facility] on [address]. Please advise.

## 2013-04-29 MED ORDER — NORETHIN ACE-ETH ESTRAD-FE 1.5-30 MG-MCG PO TABS: 1.0000 | ORAL_TABLET | Freq: Every day | ORAL | Status: DC

## 2013-04-29 NOTE — Telephone Encounter (Signed)
Rx sent      KP 

## 2013-06-21 ENCOUNTER — Other Ambulatory Visit: Payer: Self-pay | Admitting: Family Medicine

## 2013-07-21 ENCOUNTER — Other Ambulatory Visit: Payer: Self-pay | Admitting: Family Medicine

## 2013-08-20 ENCOUNTER — Other Ambulatory Visit: Payer: Self-pay | Admitting: Family Medicine

## 2013-08-27 ENCOUNTER — Ambulatory Visit (INDEPENDENT_AMBULATORY_CARE_PROVIDER_SITE_OTHER): Payer: Managed Care, Other (non HMO) | Admitting: Family Medicine

## 2013-08-27 ENCOUNTER — Encounter: Payer: Self-pay | Admitting: Family Medicine

## 2013-08-27 VITALS — BP 118/72 | HR 84 | Temp 98.3°F | Ht 66.0 in | Wt 193.0 lb

## 2013-08-27 DIAGNOSIS — F419 Anxiety disorder, unspecified: Secondary | ICD-10-CM

## 2013-08-27 DIAGNOSIS — F411 Generalized anxiety disorder: Secondary | ICD-10-CM

## 2013-08-27 DIAGNOSIS — E785 Hyperlipidemia, unspecified: Secondary | ICD-10-CM

## 2013-08-27 DIAGNOSIS — J45909 Unspecified asthma, uncomplicated: Secondary | ICD-10-CM

## 2013-08-27 DIAGNOSIS — Z0000 Encounter for general adult medical examination without abnormal findings: Secondary | ICD-10-CM

## 2013-08-27 LAB — HEPATIC FUNCTION PANEL
ALBUMIN: 3.1 g/dL — AB (ref 3.5–5.2)
ALT: 15 U/L (ref 0–35)
AST: 20 U/L (ref 0–37)
Alkaline Phosphatase: 57 U/L (ref 39–117)
Bilirubin, Direct: 0.1 mg/dL (ref 0.0–0.3)
TOTAL PROTEIN: 6.9 g/dL (ref 6.0–8.3)
Total Bilirubin: 0.9 mg/dL (ref 0.2–1.2)

## 2013-08-27 LAB — LIPID PANEL
Cholesterol: 268 mg/dL — ABNORMAL HIGH (ref 0–200)
HDL: 55.4 mg/dL (ref >39.00)
LDL Cholesterol: 194 mg/dL — ABNORMAL HIGH (ref 0–99)
TRIGLYCERIDES: 95 mg/dL (ref 0.0–149.0)
Total CHOL/HDL Ratio: 5
VLDL: 19 mg/dL (ref 0.0–40.0)

## 2013-08-27 LAB — CBC WITH DIFFERENTIAL/PLATELET
BASOS PCT: 0.7 % (ref 0.0–3.0)
Basophils Absolute: 0.1 10*3/uL (ref 0.0–0.1)
Eosinophils Absolute: 0.3 10*3/uL (ref 0.0–0.7)
Eosinophils Relative: 3 % (ref 0.0–5.0)
HEMATOCRIT: 40.2 % (ref 36.0–46.0)
HEMOGLOBIN: 13.4 g/dL (ref 12.0–15.0)
Lymphocytes Relative: 31.2 % (ref 12.0–46.0)
Lymphs Abs: 2.8 10*3/uL (ref 0.7–4.0)
MCHC: 33.3 g/dL (ref 30.0–36.0)
MCV: 92.2 fl (ref 78.0–100.0)
MONO ABS: 0.7 10*3/uL (ref 0.1–1.0)
Monocytes Relative: 7.9 % (ref 3.0–12.0)
NEUTROS ABS: 5.2 10*3/uL (ref 1.4–7.7)
NEUTROS PCT: 57.2 % (ref 43.0–77.0)
Platelets: 310 10*3/uL (ref 150.0–400.0)
RBC: 4.36 Mil/uL (ref 3.87–5.11)
RDW: 13.8 % (ref 11.5–15.5)
WBC: 9.1 10*3/uL (ref 4.0–10.5)

## 2013-08-27 LAB — URINALYSIS
Hgb urine dipstick: NEGATIVE
KETONES UR: NEGATIVE
LEUKOCYTES UA: NEGATIVE
Nitrite: NEGATIVE
Total Protein, Urine: NEGATIVE
Urine Glucose: NEGATIVE
Urobilinogen, UA: 0.2 (ref 0.0–1.0)
pH: 6 (ref 5.0–8.0)

## 2013-08-27 LAB — BASIC METABOLIC PANEL
BUN: 14 mg/dL (ref 6–23)
CHLORIDE: 106 meq/L (ref 96–112)
CO2: 27 meq/L (ref 19–32)
CREATININE: 1.1 mg/dL (ref 0.4–1.2)
Calcium: 8.6 mg/dL (ref 8.4–10.5)
GFR: 55.02 mL/min — ABNORMAL LOW (ref >60.00)
Glucose, Bld: 85 mg/dL (ref 70–99)
Potassium: 3.9 mEq/L (ref 3.5–5.1)
Sodium: 140 mEq/L (ref 135–145)

## 2013-08-27 LAB — TSH: TSH: 1.74 u[IU]/mL (ref 0.35–4.50)

## 2013-08-27 MED ORDER — ATORVASTATIN CALCIUM 10 MG PO TABS: 10.0000 mg | ORAL_TABLET | Freq: Every day | ORAL | Status: DC

## 2013-08-27 MED ORDER — MONTELUKAST SODIUM 10 MG PO TABS: ORAL_TABLET | ORAL | Status: DC

## 2013-08-27 MED ORDER — ESCITALOPRAM OXALATE 10 MG PO TABS: ORAL_TABLET | ORAL | Status: DC

## 2013-08-27 NOTE — Progress Notes (Signed)
   Subjective:    Patient ID: Sandy Green, female    DOB: [birth year], 53 y.o.   MRN: [id number]  HPI  Pt here to f/u bp and anxiety and she also states her husband said she has started snoring but her allergies have been bad lately.  She never had a problem in the past.  No other complaints.  Review of Systems As above    Objective:   Physical Exam BP 118/72  Pulse 84  Temp(Src) 98.3 F (36.8 C) (Oral)  Ht 5\' 6"  (1.676 m)  Wt 193 lb (87.544 kg)  BMI 31.17 kg/m2  SpO2 97% General appearance: alert, cooperative, appears stated age and no distress Ears: normal TM's and external ear canals both ears Nose: Nares normal. Septum midline. Mucosa normal. No drainage or sinus tenderness. Throat: lips, mucosa, and tongue normal; teeth and gums normal Neck: no adenopathy, no carotid bruit, no JVD, supple, symmetrical, trachea midline and thyroid not enlarged, symmetric, no tenderness/mass/nodules Lungs: clear to auscultation bilaterally Heart: regular rate and rhythm, S1, S2 normal, no murmur, click, rub or gallop       Assessment & Plan:  1. Other and unspecified hyperlipidemia Check labs- pt has been off med about 2 weeks - atorvastatin (LIPITOR) 10 MG tablet; Take 1 tablet (10 mg total) by mouth daily.  Dispense: 90 tablet; Refill: 3  2. Anxiety stable - escitalopram (LEXAPRO) 10 MG tablet; TAKE 1 TABLET (10 MG TOTAL) BY MOUTH DAILY.  Dispense: 90 tablet; Refill: 3  3. Asthma Refill meds - montelukast (SINGULAIR) 10 MG tablet; TAKE 1 TABLET BY MOUTH AT BEDTIME  Dispense: 90 tablet; Refill: 3  4.  htn--- stable

## 2013-08-27 NOTE — Progress Notes (Signed)
Pre visit review using our clinic review tool, if applicable. No additional management support is needed unless otherwise documented below in the visit note. 

## 2013-08-27 NOTE — Patient Instructions (Signed)

## 2013-08-27 NOTE — Addendum Note (Signed)
Addended by: Modena Morrow D on: 08/27/2013 09:50 AM   Modules accepted: Orders

## 2014-02-15 ENCOUNTER — Emergency Department (HOSPITAL_COMMUNITY)
Admission: EM | Admit: 2014-02-15 | Discharge: 2014-02-15 | Disposition: A | Discharge status: Home / Self Care | Payer: Managed Care, Other (non HMO) | Source: Home / Self Care | Attending: Emergency Medicine | Admitting: Emergency Medicine

## 2014-02-15 ENCOUNTER — Encounter (HOSPITAL_COMMUNITY): Payer: Self-pay | Admitting: *Deleted

## 2014-02-15 DIAGNOSIS — J069 Acute upper respiratory infection, unspecified: Secondary | ICD-10-CM

## 2014-02-15 LAB — POCT RAPID STREP A: STREPTOCOCCUS, GROUP A SCREEN (DIRECT): NEGATIVE

## 2014-02-15 MED ORDER — AZITHROMYCIN 250 MG PO TABS: ORAL_TABLET | ORAL | Status: DC

## 2014-02-15 MED ORDER — IPRATROPIUM BROMIDE 0.06 % NA SOLN: 2.0000 | Freq: Four times a day (QID) | NASAL | Status: DC

## 2014-02-15 MED ORDER — HYDROCOD POLST-CHLORPHEN POLST 10-8 MG/5ML PO LQCR: 5.0000 mL | Freq: Two times a day (BID) | ORAL | Status: DC | PRN

## 2014-02-15 NOTE — ED Notes (Signed)
Pt  Reports   Symptoms  Of     Congestion         Cough        Symptoms         Of      Uri            Sinus   Drainage

## 2014-02-15 NOTE — Discharge Instructions (Signed)

## 2014-02-15 NOTE — ED Provider Notes (Signed)
Chief Complaint   URI   History of Present Illness   Sandy Green is a 53 year old female who has had a 2 day history of sore throat, headache, nasal congestion with clear rhinorrhea, bilateral ear congestion, and cough productive of clear sputum. She denies any fever, chills, wheezing, shortness of breath, chest pain, or GI symptoms. She has had no known sick exposures.  Review of Systems   Other than as noted above, the patient denies any of the following symptoms: Systemic:  No fevers, chills, sweats, or myalgias. Eye:  No redness or discharge. ENT:  No ear pain, headache, nasal congestion, drainage, sinus pressure, or sore throat. Neck:  No neck pain, stiffness, or swollen glands. Lungs:  No cough, sputum production, hemoptysis, wheezing, chest tightness, shortness of breath or chest pain. GI:  No abdominal pain, nausea, vomiting or diarrhea.  [place]   Past medical history, family history, social history, meds, and allergies were reviewed. She is allergic to penicillin and tetracycline. She takes Lipitor, Lexapro, Flovent, Claritin, Singulair, and birth control pills. She has a history of asthma, hypertension, and hyperlipidemia.  Physical exam   Vital signs:  BP 142/97 mmHg  Pulse 92  Temp(Src) 97.7 F (36.5 C) (Oral)  Resp 20  SpO2 96% General:  Alert and oriented.  In no distress.  Skin warm and dry. Eye:  No conjunctival injection or drainage. Lids were normal. ENT:  TMs and canals were normal, without erythema or inflammation.  Nasal mucosa was clear and uncongested, without drainage.  Mucous membranes were moist.  Pharynx was clear with no exudate or drainage.  There were no oral ulcerations or lesions. Neck:  Supple, no adenopathy, tenderness or mass. Lungs:  No respiratory distress.  Lungs were clear to auscultation, without wheezes, rales or rhonchi.  Breath sounds were clear and equal bilaterally.  Heart:  Regular rhythm, without gallops, murmers or rubs. Skin:   Clear, warm, and dry, without rash or lesions.  Labs   Results for orders placed or performed during the hospital encounter of 02/15/14  POCT rapid strep A [facility])  Result Value Ref Range   Streptococcus, Group A Screen (Direct) NEGATIVE NEGATIVE    Assessment     The encounter diagnosis was Viral URI.  There is no evidence of pneumonia, strep throat, sinusitis, otitis media.    Plan    1.  Meds:  The following meds were prescribed:   Discharge Medication List as of 02/15/2014 10:57 AM    START taking these medications   Details  azithromycin (ZITHROMAX Z-PAK) 250 MG tablet Take as directed., Print    chlorpheniramine-HYDROcodone (TUSSIONEX) 10-8 MG/5ML LQCR Take 5 mLs by mouth every 12 (twelve) hours as needed for cough., Starting 02/15/2014, Until Discontinued, Normal    ipratropium (ATROVENT) 0.06 % nasal spray Place 2 sprays into both nostrils 4 (four) times daily., Starting 02/15/2014, Until Discontinued, Normal        2.  Patient Education/Counseling:  The patient was given appropriate handouts, self care instructions, and instructed in symptomatic relief.  Instructed to get extra fluids and extra rest.  The patient was told not to get the prescription for antibiotic filled unless her respiratory symptoms had persisted for more than 7 to 10 days.  3.  Follow up:  The patient was told to follow up here if no better in 3 to 4 days, or sooner if becoming worse in any way, and given some red flag symptoms such as increasing fever, difficulty breathing, chest  pain, or persistent vomiting which would prompt immediate return.       Harden Mo, MD 02/15/14 [phone]

## 2014-02-17 LAB — CULTURE, GROUP A STREP

## 2014-03-03 ENCOUNTER — Ambulatory Visit (INDEPENDENT_AMBULATORY_CARE_PROVIDER_SITE_OTHER): Payer: Managed Care, Other (non HMO) | Admitting: Family Medicine

## 2014-03-03 ENCOUNTER — Encounter: Payer: Self-pay | Admitting: Family Medicine

## 2014-03-03 VITALS — BP 132/90 | HR 80 | Temp 97.7°F | Wt 189.4 lb

## 2014-03-03 DIAGNOSIS — E785 Hyperlipidemia, unspecified: Secondary | ICD-10-CM

## 2014-03-03 LAB — HEPATIC FUNCTION PANEL
ALBUMIN: 3.1 g/dL — AB (ref 3.5–5.2)
ALT: 18 U/L (ref 0–35)
AST: 22 U/L (ref 0–37)
Alkaline Phosphatase: 60 U/L (ref 39–117)
Bilirubin, Direct: 0.2 mg/dL (ref 0.0–0.3)
TOTAL PROTEIN: 6.9 g/dL (ref 6.0–8.3)
Total Bilirubin: 1.1 mg/dL (ref 0.2–1.2)

## 2014-03-03 LAB — LIPID PANEL
Cholesterol: 202 mg/dL — ABNORMAL HIGH (ref 0–200)
HDL: 50.8 mg/dL (ref >39.00)
LDL Cholesterol: 134 mg/dL — ABNORMAL HIGH (ref 0–99)
NONHDL: 151.2
TRIGLYCERIDES: 85 mg/dL (ref 0.0–149.0)
Total CHOL/HDL Ratio: 4
VLDL: 17 mg/dL (ref 0.0–40.0)

## 2014-03-03 LAB — BASIC METABOLIC PANEL
BUN: 16 mg/dL (ref 6–23)
CALCIUM: 8.4 mg/dL (ref 8.4–10.5)
CO2: 25 mEq/L (ref 19–32)
CREATININE: 1 mg/dL (ref 0.4–1.2)
Chloride: 104 mEq/L (ref 96–112)
GFR: 63.4 mL/min (ref >60.00)
GLUCOSE: 84 mg/dL (ref 70–99)
Potassium: 3.6 mEq/L (ref 3.5–5.1)
Sodium: 136 mEq/L (ref 135–145)

## 2014-03-03 NOTE — Progress Notes (Signed)
   Subjective:    Patient ID: Sandy Green, female    DOB: 05-20-1960, 53 y.o.   MRN: [id number]  HPI Pt here to f/u on her cholesterol.  No complaints.     Review of Systems    as above Objective:   Physical Exam  BP 132/90 mmHg  Pulse 80  Temp(Src) 97.7 F (36.5 C) (Oral)  Wt 189 lb 6.4 oz (85.911 kg)  SpO2 97% General appearance: alert, cooperative, appears stated age and no distress Neck: no adenopathy, supple, symmetrical, trachea midline and thyroid not enlarged, symmetric, no tenderness/mass/nodules Lungs: clear to auscultation bilaterally Heart: S1, S2 normal Extremities: extremities normal, atraumatic, no cyanosis or edema      Assessment & Plan:  1. Hyperlipidemia Check labs, con't meds - Basic metabolic panel - Hepatic function panel - Lipid panel  2.  Pt refused flu shot

## 2014-03-03 NOTE — Progress Notes (Signed)
Pre visit review using our clinic review tool, if applicable. No additional management support is needed unless otherwise documented below in the visit note. 

## 2014-03-03 NOTE — Patient Instructions (Signed)

## 2014-03-06 DIAGNOSIS — E785 Hyperlipidemia, unspecified: Secondary | ICD-10-CM

## 2014-03-06 MED ORDER — ATORVASTATIN CALCIUM 20 MG PO TABS: 20.0000 mg | ORAL_TABLET | Freq: Every day | ORAL | Status: DC

## 2014-03-06 MED ORDER — NORETHIN ACE-ETH ESTRAD-FE 1.5-30 MG-MCG PO TABS: 1.0000 | ORAL_TABLET | Freq: Every day | ORAL | Status: DC

## 2014-04-07 ENCOUNTER — Encounter: Payer: Self-pay | Admitting: Family Medicine

## 2014-04-07 NOTE — Telephone Encounter (Signed)
Pt needs ov. 

## 2014-04-09 ENCOUNTER — Encounter: Payer: Self-pay | Admitting: Medical

## 2014-04-09 ENCOUNTER — Ambulatory Visit (INDEPENDENT_AMBULATORY_CARE_PROVIDER_SITE_OTHER): Payer: Managed Care, Other (non HMO) | Admitting: Medical

## 2014-04-09 VITALS — BP 147/99 | HR 78 | Temp 98.5°F | Ht 66.0 in | Wt 188.6 lb

## 2014-04-09 DIAGNOSIS — H109 Unspecified conjunctivitis: Secondary | ICD-10-CM | POA: Insufficient documentation

## 2014-04-09 MED ORDER — TOBRAMYCIN 0.3 % OP SOLN: 2.0000 [drp] | Freq: Four times a day (QID) | OPHTHALMIC | Status: DC

## 2014-04-09 NOTE — Assessment & Plan Note (Signed)
You appear to have probable mild allergies/allergic conjunctivitis with some possible recent early rt bacterial conjunctivitis. I would advise using otc antihistamine for itching whichever one you think helped the most  and I am prescribing tobrex eye drops. If your eye is worsening. No improvement by tomorrow afternoon please let us know as that would afford [place] some time before weekend to possibly get you in with optometrist if needed.  On exam you have small pimple like area rt cheek. I want you to note if these area start cropping up around eye or any vesicles. If such were to occur then notify us and would need antiviral medication.(Let us know immediately if such were to occur.

## 2014-04-09 NOTE — Progress Notes (Signed)
Subjective:    Patient ID: Sandy Green, female    DOB: Jan 03, 1961, 54 y.o.   MRN: [id number]  HPI   Pt has rt eye itchy for weeks on and off x 4-6 wks.. Then since Sunday night occasionally will wipe and get filmy discharge. On Monday morning her lids were stuck together. Pt not working and no  Contact with kids. No known contacts with pink eye recently. No trauma to eye. No rash or lesions around her eye. No vesicles reported. No light sensitivity. No flashing to eye. No pressure sensation. Occasional lt eye itching.  Past Medical History  Diagnosis Date  . Asthma   . Chicken pox   . Heart murmur   . Hypertension   . Hyperlipemia   . Ankle fracture, left     History   Social History  . Marital Status: Married    Spouse Name: N/A    Number of Children: N/A  . Years of Education: N/A   Occupational History  . Not on file.   Social History Main Topics  . Smoking status: Never Smoker   . Smokeless tobacco: Not on file  . Alcohol Use: No  . Drug Use: No  . Sexual Activity:    Partners: Male   Other Topics Concern  . Not on file   Social History Narrative   Exercise-- no     No past surgical history on file.  Family History  Problem Relation Age of Onset  . Diabetes Maternal Grandmother   . Cancer Maternal Grandmother     Unsure of kind? Breast  . Hypertension Mother   . Hypertension Father   . Heart disease Father     cabg.6    Allergies  Allergen Reactions  . Penicillins Other (See Comments)    Reaction unknown  . Tetracyclines & Related Nausea Only and Rash    Current Outpatient Prescriptions on File Prior to Visit  Medication Sig Dispense Refill  . atorvastatin (LIPITOR) 20 MG tablet Take 1 tablet (20 mg total) by mouth daily. 90 tablet 1  . escitalopram (LEXAPRO) 10 MG tablet TAKE 1 TABLET (10 MG TOTAL) BY MOUTH DAILY. 90 tablet 3  . fluticasone (FLOVENT HFA) 220 MCG/ACT inhaler Inhale 1 puff into the lungs 2 (two) times daily.    Marland Kitchen loratadine  (CLARITIN) 10 MG tablet Take 10 mg by mouth daily.    . montelukast (SINGULAIR) 10 MG tablet TAKE 1 TABLET BY MOUTH AT BEDTIME 90 tablet 3  . norethindrone-ethinyl estradiol-iron (MICROGESTIN FE,GILDESS FE,LOESTRIN FE) 1.5-30 MG-MCG tablet Take 1 tablet by mouth daily. 3 Package 4   No current facility-administered medications on file prior to visit.    BP 147/99 mmHg  Pulse 78  Temp(Src) 98.5 F (36.9 C) (Oral)  Ht 5\' 6"  (1.676 m)  Wt 188 lb 9.6 oz (85.548 kg)  BMI 30.46 kg/m2  SpO2 97%     Review of Systems  Constitutional: Negative for fever, chills and fatigue.  HENT: Negative for congestion, ear pain, rhinorrhea, sinus pressure and sneezing.   Eyes: Positive for discharge, redness and itching. Negative for pain and visual disturbance.       Rt eye red when itches. Transient blurred vision with filmy discharge and only transient. None now.  Cardiovascular: Negative for chest pain and palpitations.  Musculoskeletal: Negative for back pain.  Skin: Negative for rash.  Neurological: Negative for facial asymmetry, speech difficulty, light-headedness and headaches.  Hematological: Negative for adenopathy. Does not bruise/bleed easily.  Objective:   Physical Exam   .General  Mental Status - Alert. General Appearance - Well groomed. Not in acute distress.  Skin Rashes- No Rashes. One small pimple on rt cheek. And another on rt side forehead below scalp line.  HEENT Head- Normal. Ear Auditory Canal - Left- Normal. Right - Normal.Tympanic Membrane- Left- Normal. Right- Normal. Eye Sclera/Conjunctiva- Left- Normal. Right- Faint injected conjunctiva. No obvious discharge. Both eyes 20/30, Lt eye 20/70. Rt eye 20/40. Nose & Sinuses Nasal Mucosa- Left-  Mild boggy or Congested. Right- mild boggy or Congested. Mouth & Throat Lips: Upper Lip- Normal: no dryness, cracking, pallor, cyanosis, or vesicular eruption. Lower Lip-Normal: no dryness, cracking, pallor, cyanosis or  vesicular eruption. Buccal Mucosa- Bilateral- No Aphthous ulcers. Oropharynx- No Discharge or Erythema. Tonsils: Characteristics- Bilateral- No Erythema or Congestion. Size/Enlargement- Bilateral- No enlargement. Discharge- bilateral-None.  Neck Neck- Supple. No Masses.   Chest and Lung Exam Auscultation: Breath Sounds:- even and unlabored, but bilateral upper lobe rhonchi.  Cardiovascular Auscultation:Rythm- Regular, rate and rhythm. Murmurs & Other Heart Sounds:Ausculatation of the heart reveal- No Murmurs.  Lymphatic Head & Neck General Head & Neck Lymphatics: Bilateral: Description- No Localized lymphadenopathy.         Assessment & Plan:

## 2014-04-09 NOTE — Progress Notes (Signed)
Pre visit review using our clinic review tool, if applicable. No additional management support is needed unless otherwise documented below in the visit note. 

## 2014-04-09 NOTE — Patient Instructions (Addendum)
You appear to have probable mild allergies/allergic conjunctivitis with some possible recent early rt bacterial conjunctivitis. I would advise using otc antihistamine for itching whichever one you think helped the most  and I am prescribing tobrex eye drops. If your eye is worsening. No improvement by tomorrow afternoon please let us know as that would afford [place] some time before weekend to possibly get you in with optometrist if needed.  On exam you have small pimple like area rt cheek. I want you to note if these area start cropping up around eye or any vesicles. If such were to occur then notify us and would need antiviral medication.(Let us know immediately if such were to occur.  Follow up in 2-5 days or as needed.

## 2014-05-03 ENCOUNTER — Emergency Department (HOSPITAL_COMMUNITY)
Admission: EM | Admit: 2014-05-03 | Discharge: 2014-05-03 | Disposition: A | Discharge status: Home / Self Care | Payer: Managed Care, Other (non HMO) | Source: Home / Self Care | Attending: Emergency Medicine | Admitting: Emergency Medicine

## 2014-05-03 ENCOUNTER — Encounter (HOSPITAL_COMMUNITY): Payer: Self-pay | Admitting: *Deleted

## 2014-05-03 ENCOUNTER — Emergency Department (INDEPENDENT_AMBULATORY_CARE_PROVIDER_SITE_OTHER): Payer: Managed Care, Other (non HMO)

## 2014-05-03 DIAGNOSIS — J209 Acute bronchitis, unspecified: Secondary | ICD-10-CM

## 2014-05-03 MED ORDER — PREDNISONE 20 MG PO TABS: ORAL_TABLET | ORAL | Status: DC

## 2014-05-03 MED ORDER — HYDROCOD POLST-CHLORPHEN POLST 10-8 MG/5ML PO LQCR: 5.0000 mL | Freq: Two times a day (BID) | ORAL | Status: DC | PRN

## 2014-05-03 MED ORDER — ALBUTEROL SULFATE HFA 108 (90 BASE) MCG/ACT IN AERS: 1.0000 | INHALATION_SPRAY | Freq: Four times a day (QID) | RESPIRATORY_TRACT | Status: DC | PRN

## 2014-05-03 NOTE — Discharge Instructions (Signed)

## 2014-05-03 NOTE — ED Notes (Signed)
Pt  Reports     Of   Cough   Congested    -  Sinus  Drainage  /  Pressure       X   1  Week      Pt    Reports   Symptoms  Not  releived  By  otc  meds

## 2014-05-03 NOTE — ED Provider Notes (Signed)
Chief Complaint   URI   History of Present Illness   Sandy Green is a 54 year old female who's had a one-week history of paroxysmal cough productive of clear to yellow sputum, postnasal drip, sore throat, and chills. She denies any fever or GI symptoms. Her husband has been sick with similar symptoms.  Review of Systems   Other than as noted above, the patient denies any of the following symptoms: Systemic:  No fevers, chills, sweats, or myalgias. Eye:  No redness or discharge. ENT:  No ear pain, headache, nasal congestion, drainage, sinus pressure, or sore throat. Neck:  No neck pain, stiffness, or swollen glands. Lungs:  No cough, sputum production, hemoptysis, wheezing, chest tightness, shortness of breath or chest pain. GI:  No abdominal pain, nausea, vomiting or diarrhea.  [place]   Past medical history, family history, social history, meds, and allergies were reviewed. She is allergic to penicillin and tetracycline. She has asthma and takes Singulair, lisinopril, and Flonase. She also has high blood pressure.  Physical exam   Vital signs:  BP 151/94 mmHg  Pulse 81  Temp(Src) 98.5 F (36.9 C) (Oral)  Resp 16  SpO2 98%  LMP 04/26/2014 (LMP Unknown) General:  Alert and oriented.  In no distress.  Skin warm and dry. Eye:  No conjunctival injection or drainage. Lids were normal. ENT:  TMs and canals were normal, without erythema or inflammation.  Nasal mucosa was clear and uncongested, without drainage.  Mucous membranes were moist.  Pharynx was clear with no exudate or drainage.  There were no oral ulcerations or lesions. Neck:  Supple, no adenopathy, tenderness or mass. Lungs:  No respiratory distress.  Lungs were clear to auscultation, without wheezes, rales or rhonchi.  Breath sounds were clear and equal bilaterally.  Heart:  Regular rhythm, without gallops, murmers or rubs. Skin:  Clear, warm, and dry, without rash or lesions.   Radiology   Dg Chest 2  View  05/03/2014   CLINICAL DATA:  Cough x2 weeks  EXAM: CHEST - 2 VIEW  COMPARISON:  None available  FINDINGS: Lungs are clear, mildly hyperinflated. Heart size and mediastinal contours are within normal limits. No effusion. Visualized skeletal structures are unremarkable.  IMPRESSION: No acute cardiopulmonary disease.   Electronically Signed   By: Arne Cleveland M.D.   On: 05/03/2014 12:33   Assessment     The encounter diagnosis was Acute bronchitis, unspecified organism.  There is no evidence of pneumonia, strep throat, sinusitis, otitis media.    Plan    1.  Meds:  The following meds were prescribed:   Discharge Medication List as of 05/03/2014 12:57 PM    START taking these medications   Details  albuterol (PROVENTIL HFA;VENTOLIN HFA) 108 (90 BASE) MCG/ACT inhaler Inhale 1-2 puffs into the lungs every 6 (six) hours as needed for wheezing or shortness of breath., Starting 05/03/2014, Until Discontinued, Normal    chlorpheniramine-HYDROcodone (TUSSIONEX) 10-8 MG/5ML LQCR Take 5 mLs by mouth every 12 (twelve) hours as needed for cough., Starting 05/03/2014, Until Discontinued, Normal    predniSONE (DELTASONE) 20 MG tablet Take 3 daily for 5 days, 2 daily for 5 days, 1 daily for 5 days., Normal        2.  Patient Education/Counseling:  The patient was given appropriate handouts, self care instructions, and instructed in symptomatic relief.  Instructed to get extra fluids and extra rest.    3.  Follow up:  The patient was told to follow up here if no  better in 3 to 4 days, or sooner if becoming worse in any way, and given some red flag symptoms such as increasing fever, difficulty breathing, chest pain, or persistent vomiting which would prompt immediate return.       Harden Mo, MD 05/03/14 [phone]

## 2014-05-28 ENCOUNTER — Encounter (HOSPITAL_COMMUNITY): Payer: Self-pay | Admitting: *Deleted

## 2014-05-28 ENCOUNTER — Encounter: Payer: Self-pay | Admitting: Family Medicine

## 2014-05-28 ENCOUNTER — Emergency Department (HOSPITAL_COMMUNITY)
Admission: EM | Admit: 2014-05-28 | Discharge: 2014-05-29 | Disposition: A | Discharge status: Home / Self Care | Payer: Managed Care, Other (non HMO) | Attending: Emergency Medicine | Admitting: Emergency Medicine

## 2014-05-28 DIAGNOSIS — R040 Epistaxis: Secondary | ICD-10-CM

## 2014-05-28 DIAGNOSIS — Z8781 Personal history of (healed) traumatic fracture: Secondary | ICD-10-CM | POA: Insufficient documentation

## 2014-05-28 DIAGNOSIS — Z8619 Personal history of other infectious and parasitic diseases: Secondary | ICD-10-CM | POA: Diagnosis not present

## 2014-05-28 DIAGNOSIS — Z79899 Other long term (current) drug therapy: Secondary | ICD-10-CM | POA: Insufficient documentation

## 2014-05-28 DIAGNOSIS — Z880 Allergy status to penicillin: Secondary | ICD-10-CM | POA: Insufficient documentation

## 2014-05-28 DIAGNOSIS — Z7951 Long term (current) use of inhaled steroids: Secondary | ICD-10-CM | POA: Insufficient documentation

## 2014-05-28 DIAGNOSIS — I10 Essential (primary) hypertension: Secondary | ICD-10-CM | POA: Insufficient documentation

## 2014-05-28 DIAGNOSIS — Z7952 Long term (current) use of systemic steroids: Secondary | ICD-10-CM | POA: Insufficient documentation

## 2014-05-28 DIAGNOSIS — R011 Cardiac murmur, unspecified: Secondary | ICD-10-CM | POA: Diagnosis not present

## 2014-05-28 DIAGNOSIS — E785 Hyperlipidemia, unspecified: Secondary | ICD-10-CM | POA: Diagnosis not present

## 2014-05-28 DIAGNOSIS — J45909 Unspecified asthma, uncomplicated: Secondary | ICD-10-CM | POA: Diagnosis not present

## 2014-05-28 NOTE — ED Provider Notes (Signed)
CSN: [id number]     Arrival date & time 05/28/14  2203 History  This chart was scribed for non-physician practitioner, Hollace Kinnier. Threasa Alpha, PA-C working with Threasa Beards, MD by Tula Nakayama, ED scribe. This patient was seen in room TR05C/TR05C and the patient's care was started at 11:28 PM  Chief Complaint  Patient presents with  . Epistaxis   The history is provided by the patient. No language interpreter was used.   HPI Comments: Sandy Green is a 54 y.o. female with a history of HTN and hyperlipidemia who presents to the Emergency Department after a moderate nosebleed that occurred 2 hours ago, but is currently resolved. She notes bleeding was controlled by applying pressure. The onset of her symptoms occurred while she was at rest. Pt has a history of HTN, but does not take BP medications because of prior adverse reactions. She denies a history of DM and anti-coagulant use. Pt also denies frequent bruising as an associated symptom.   PCP [place] Past Medical History  Diagnosis Date  . Asthma   . Chicken pox   . Heart murmur   . Hypertension   . Hyperlipemia   . Ankle fracture, left    History reviewed. No pertinent past surgical history. Family History  Problem Relation Age of Onset  . Diabetes Maternal Grandmother   . Cancer Maternal Grandmother     Unsure of kind? Breast  . Hypertension Mother   . Hypertension Father   . Heart disease Father     cabg.6   History  Substance Use Topics  . Smoking status: Never Smoker   . Smokeless tobacco: Never Used  . Alcohol Use: No   OB History    No data available     Review of Systems  HENT: Positive for nosebleeds.   Hematological: Does not bruise/bleed easily.  All other systems reviewed and are negative.     Allergies  Penicillins and Tetracyclines & related  Home Medications   Prior to Admission medications   Medication Sig Start Date End Date Taking? Authorizing Provider  albuterol (PROVENTIL HFA;VENTOLIN HFA)  108 (90 BASE) MCG/ACT inhaler Inhale 1-2 puffs into the lungs every 6 (six) hours as needed for wheezing or shortness of breath. 05/03/14   Harden Mo, MD  atorvastatin (LIPITOR) 20 MG tablet Take 1 tablet (20 mg total) by mouth daily. 03/06/14   Rosalita Chessman, DO  chlorpheniramine-HYDROcodone (TUSSIONEX) 10-8 MG/5ML LQCR Take 5 mLs by mouth every 12 (twelve) hours as needed for cough. 05/03/14   Harden Mo, MD  escitalopram (LEXAPRO) 10 MG tablet TAKE 1 TABLET (10 MG TOTAL) BY MOUTH DAILY. 08/27/13   Alferd Apa [place]ne, DO  fluticasone (FLOVENT HFA) 220 MCG/ACT inhaler Inhale 1 puff into the lungs 2 (two) times daily.    Historical Provider, MD  loratadine (CLARITIN) 10 MG tablet Take 10 mg by mouth daily.    Historical Provider, MD  montelukast (SINGULAIR) 10 MG tablet TAKE 1 TABLET BY MOUTH AT BEDTIME 08/27/13   Rosalita Chessman, DO  norethindrone-ethinyl estradiol-iron (MICROGESTIN FE,GILDESS FE,LOESTRIN FE) 1.5-30 MG-MCG tablet Take 1 tablet by mouth daily. 03/06/14   Rosalita Chessman, DO  predniSONE (DELTASONE) 20 MG tablet Take 3 daily for 5 days, 2 daily for 5 days, 1 daily for 5 days. 05/03/14   Harden Mo, MD  tobramycin (TOBREX) 0.3 % ophthalmic solution Place 2 drops into the right eye every 6 (six) hours. 04/09/14   Meriam Sprague Saguier, PA-C  BP 172/101 mmHg  Pulse 94  Temp(Src) 98.3 F (36.8 C) (Oral)  Resp 18  Ht 5\' 6"  (1.676 m)  Wt 186 lb (84.369 kg)  BMI 30.04 kg/m2  SpO2 96%  LMP 04/26/2014 (LMP Unknown) Physical Exam  Constitutional: She appears well-developed and well-nourished. No distress.  HENT:  Head: Normocephalic and atraumatic.  Dilated blood vessels left anterior nare; small ulcerated area  Eyes: Conjunctivae and EOM are normal.  Neck: Neck supple. No tracheal deviation present.  Cardiovascular: Normal rate and regular rhythm.   Elevated BP  Pulmonary/Chest: Effort normal. No respiratory distress.  Skin: Skin is warm and dry.  Psychiatric: She has a normal mood  and affect. Her behavior is normal.  Nursing note and vitals reviewed.   ED Course  Procedures  DIAGNOSTIC STUDIES: Oxygen Saturation is 96% on RA, normal by my interpretation.    COORDINATION OF CARE: 11:32 PM Discussed treatment plan with pt at bedside and pt agreed to plan.  Labs Review Labs Reviewed - No data to display  Imaging Review No results found.   EKG Interpretation None      MDM   Final diagnoses:  Nosebleed    Pt counseled on blood pressure.  Pt advised to see her Md for recheck of blood pressure.  Pt counseled on blood pressure and nosebleeds.    [place], PA-C 05/29/14 [place], MD 05/29/14 [phone]

## 2014-05-28 NOTE — ED Notes (Signed)
Pt reports nosebleed at 2100 today, which has stopped bleeding at this time.  Pt reports large amount of bright red blood.

## 2014-05-28 NOTE — ED Notes (Signed)
Patient presents stating she was watching TV and her nose started bleeding.  States it was from the left side.  In triage no bleeding noted.

## 2014-05-29 ENCOUNTER — Encounter: Payer: Self-pay | Admitting: Family Medicine

## 2014-05-29 ENCOUNTER — Ambulatory Visit (INDEPENDENT_AMBULATORY_CARE_PROVIDER_SITE_OTHER): Payer: Managed Care, Other (non HMO) | Admitting: Family Medicine

## 2014-05-29 VITALS — BP 147/86 | HR 90 | Temp 98.4°F | Wt 189.4 lb

## 2014-05-29 DIAGNOSIS — I10 Essential (primary) hypertension: Secondary | ICD-10-CM

## 2014-05-29 DIAGNOSIS — R079 Chest pain, unspecified: Secondary | ICD-10-CM

## 2014-05-29 MED ORDER — VALSARTAN 80 MG PO TABS: 80.0000 mg | ORAL_TABLET | Freq: Every day | ORAL | Status: DC

## 2014-05-29 NOTE — Progress Notes (Signed)
Pre visit review using our clinic review tool, if applicable. No additional management support is needed unless otherwise documented below in the visit note. 

## 2014-05-29 NOTE — Discharge Instructions (Signed)
Nosebleed Nosebleeds can be caused by many conditions, including trauma, infections, polyps, foreign bodies, dry mucous membranes or climate, medicines, and air conditioning. Most nosebleeds occur in the front of the nose. Because of this location, most nosebleeds can be controlled by pinching the nostrils gently and continuously for at least 10 to 20 minutes. The long, continuous pressure allows enough time for the blood to clot. If pressure is released during that 10 to 20 minute time period, the process may have to be started again. The nosebleed may stop by itself or quit with pressure, or it may need concentrated heating (cautery) or pressure from packing. HOME CARE INSTRUCTIONS   If your nose was packed, try to maintain the pack inside until your health care provider removes it. If a gauze pack was used and it starts to fall out, gently replace it or cut the end off. Do not cut if a balloon catheter was used to pack the nose. Otherwise, do not remove unless instructed.  Avoid blowing your nose for 12 hours after treatment. This could dislodge the pack or clot and start the bleeding again.  If the bleeding starts again, sit up and bend forward, gently pinching the front half of your nose continuously for 20 minutes.  If bleeding was caused by dry mucous membranes, use over-the-counter saline nasal spray or gel. This will keep the mucous membranes moist and allow them to heal. If you must use a lubricant, choose the water-soluble variety. Use it only sparingly and not within several hours of lying down.  Do not use petroleum jelly or mineral oil, as these may drip into the lungs and cause serious problems.  Maintain humidity in your home by using less air conditioning or by using a humidifier.  Do not use aspirin or medicines which make bleeding more likely. Your health care provider can give you recommendations on this.  Resume normal activities as you are able, but try to avoid straining,  lifting, or bending at the waist for several days.  If the nosebleeds become recurrent and the cause is unknown, your health care provider may suggest laboratory tests. SEEK MEDICAL CARE IF: You have a fever. SEEK IMMEDIATE MEDICAL CARE IF:   Bleeding recurs and cannot be controlled.  There is unusual bleeding from or bruising on other parts of the body.  Nosebleeds continue.  There is any worsening of the condition which originally brought you in.  You become light-headed, feel faint, become sweaty, or vomit blood. MAKE SURE YOU:   Understand these instructions.  Will watch your condition.  Will get help right away if you are not doing well or get worse. Document Released: 12/29/2004 Document Revised: 08/05/2013 Document Reviewed: 02/19/2009 [facility] Patient Information 2015 [place], [place]. This information is not intended to replace advice given to you by your health care provider. Make sure you discuss any questions you have with your health care provider. Hypertension Hypertension, commonly called high blood pressure, is when the force of blood pumping through your arteries is too strong. Your arteries are the blood vessels that carry blood from your heart throughout your body. A blood pressure reading consists of a higher number over a lower number, such as 110/72. The higher number (systolic) is the pressure inside your arteries when your heart pumps. The lower number (diastolic) is the pressure inside your arteries when your heart relaxes. Ideally you want your blood pressure below 120/80. Hypertension forces your heart to work harder to pump blood. Your arteries may become  narrow or stiff. Having hypertension puts you at risk for heart disease, stroke, and other problems.  RISK FACTORS Some risk factors for high blood pressure are controllable. Others are not.  Risk factors you cannot control include:   Race. You may be at higher risk if you are African American.  Age. Risk  increases with age.  Gender. Men are at higher risk than women before age 37 years. After age 55, women are at higher risk than men. Risk factors you can control include:  Not getting enough exercise or physical activity.  Being overweight.  Getting too much fat, sugar, calories, or salt in your diet.  Drinking too much alcohol. SIGNS AND SYMPTOMS Hypertension does not usually cause signs or symptoms. Extremely high blood pressure (hypertensive crisis) may cause headache, anxiety, shortness of breath, and nosebleed. DIAGNOSIS  To check if you have hypertension, your health care provider will measure your blood pressure while you are seated, with your arm held at the level of your heart. It should be measured at least twice using the same arm. Certain conditions can cause a difference in blood pressure between your right and left arms. A blood pressure reading that is higher than normal on one occasion does not mean that you need treatment. If one blood pressure reading is high, ask your health care provider about having it checked again. TREATMENT  Treating high blood pressure includes making lifestyle changes and possibly taking medicine. Living a healthy lifestyle can help lower high blood pressure. You may need to change some of your habits. Lifestyle changes may include:  Following the DASH diet. This diet is high in fruits, vegetables, and whole grains. It is low in salt, red meat, and added sugars.  Getting at least 2 hours of brisk physical activity every week.  Losing weight if necessary.  Not smoking.  Limiting alcoholic beverages.  Learning ways to reduce stress. If lifestyle changes are not enough to get your blood pressure under control, your health care provider may prescribe medicine. You may need to take more than one. Work closely with your health care provider to understand the risks and benefits. HOME CARE INSTRUCTIONS  Have your blood pressure rechecked as  directed by your health care provider.   Take medicines only as directed by your health care provider. Follow the directions carefully. Blood pressure medicines must be taken as prescribed. The medicine does not work as well when you skip doses. Skipping doses also puts you at risk for problems.   Do not smoke.   Monitor your blood pressure at home as directed by your health care provider. SEEK MEDICAL CARE IF:   You think you are having a reaction to medicines taken.  You have recurrent headaches or feel dizzy.  You have swelling in your ankles.  You have trouble with your vision. SEEK IMMEDIATE MEDICAL CARE IF:  You develop a severe headache or confusion.  You have unusual weakness, numbness, or feel faint.  You have severe chest or abdominal pain.  You vomit repeatedly.  You have trouble breathing. MAKE SURE YOU:   Understand these instructions.  Will watch your condition.  Will get help right away if you are not doing well or get worse. Document Released: 03/21/2005 Document Revised: 08/05/2013 Document Reviewed: 01/11/2013 [facility] Patient Information 2015 [place], [place]. This information is not intended to replace advice given to you by your health care provider. Make sure you discuss any questions you have with your health care provider.

## 2014-05-29 NOTE — ED Notes (Signed)
Pt made aware to return if symptoms worsen or if any life threatening symptoms occur.   

## 2014-05-29 NOTE — Patient Instructions (Signed)

## 2014-05-29 NOTE — Telephone Encounter (Signed)
Pt needs appointment

## 2014-05-29 NOTE — Progress Notes (Signed)
   Subjective:    Patient ID: Sandy Green, female    DOB: 1960-05-29, 54 y.o.   MRN: [id number]  HPI  Patient here for f/u ed for nose bleeds and htn.  Pt was on couch last night when her nose started bleeding and she could not get it to stop.  bp found to be high in ER.  Pt has been on lisinopril , losartan hct and norvasc-- all made her dizzy.    Past Medical History  Diagnosis Date  . Asthma   . Chicken pox   . Heart murmur   . Hypertension   . Hyperlipemia   . Ankle fracture, left     Review of Systems  Constitutional: Negative for activity change, appetite change, fatigue and unexpected weight change.  Respiratory: Negative for cough and shortness of breath.   Cardiovascular: Negative for chest pain and palpitations.  Psychiatric/Behavioral: Negative for behavioral problems and dysphoric mood. The patient is not nervous/anxious.        Objective:    Physical Exam  Constitutional: She is oriented to person, place, and time. She appears well-developed and well-nourished. No distress.  HENT:  Right Ear: External ear normal.  Left Ear: External ear normal.  Nose: Nose normal.  Mouth/Throat: Oropharynx is clear and moist.  Eyes: EOM are normal. Pupils are equal, round, and reactive to light.  Neck: Normal range of motion. Neck supple.  Cardiovascular: Normal rate, regular rhythm and normal heart sounds.   No murmur heard. Pulmonary/Chest: Effort normal and breath sounds normal. No respiratory distress. She has no wheezes. She has no rales. She exhibits no tenderness.  Neurological: She is alert and oriented to person, place, and time.  Psychiatric: She has a normal mood and affect. Her behavior is normal. Judgment and thought content normal.    BP 147/86 mmHg  Pulse 90  Temp(Src) 98.4 F (36.9 C) (Oral)  Wt 189 lb 6.4 oz (85.911 kg)  SpO2 98%  LMP 04/26/2014 (LMP Unknown) Wt Readings from Last 3 Encounters:  05/29/14 189 lb 6.4 oz (85.911 kg)  04/09/14 188 lb 9.6 oz  (85.548 kg)  03/03/14 189 lb 6.4 oz (85.911 kg)     Lab Results  Component Value Date   WBC 9.1 08/27/2013   HGB 13.4 08/27/2013   HCT 40.2 08/27/2013   PLT 310.0 08/27/2013   GLUCOSE 84 03/03/2014   CHOL 202* 03/03/2014   TRIG 85.0 03/03/2014   HDL 50.80 03/03/2014   LDLCALC 134* 03/03/2014   ALT 18 03/03/2014   AST 22 03/03/2014   NA 136 03/03/2014   K 3.6 03/03/2014   CL 104 03/03/2014   CREATININE 1.0 03/03/2014   BUN 16 03/03/2014   CO2 25 03/03/2014   TSH 1.74 08/27/2013    No results found.     Assessment & Plan:   Problem List Items Addressed This Visit    None    Visit Diagnoses    Essential hypertension    -  Primary    Relevant Medications    valsartan (DIOVAN) tablet    Other Relevant Orders    2D Echocardiogram without contrast    Chest pain, unspecified chest pain type        Relevant Orders    EKG 12-Lead (Completed)    2D Echocardiogram without contrast        Garnet Koyanagi, DO

## 2014-06-04 ENCOUNTER — Ambulatory Visit (HOSPITAL_COMMUNITY): Payer: Managed Care, Other (non HMO) | Attending: Cardiovascular Disease | Admitting: Radiology

## 2014-06-04 DIAGNOSIS — R079 Chest pain, unspecified: Secondary | ICD-10-CM | POA: Diagnosis not present

## 2014-06-04 DIAGNOSIS — I10 Essential (primary) hypertension: Secondary | ICD-10-CM | POA: Insufficient documentation

## 2014-06-04 NOTE — Progress Notes (Signed)
Echocardiogram performed.  

## 2014-06-16 ENCOUNTER — Ambulatory Visit (INDEPENDENT_AMBULATORY_CARE_PROVIDER_SITE_OTHER): Payer: Managed Care, Other (non HMO) | Admitting: Family Medicine

## 2014-06-16 ENCOUNTER — Encounter: Payer: Self-pay | Admitting: Family Medicine

## 2014-06-16 VITALS — BP 132/88 | HR 83 | Temp 98.3°F | Wt 189.0 lb

## 2014-06-16 DIAGNOSIS — E785 Hyperlipidemia, unspecified: Secondary | ICD-10-CM

## 2014-06-16 DIAGNOSIS — F419 Anxiety disorder, unspecified: Secondary | ICD-10-CM

## 2014-06-16 DIAGNOSIS — J45909 Unspecified asthma, uncomplicated: Secondary | ICD-10-CM

## 2014-06-16 DIAGNOSIS — I10 Essential (primary) hypertension: Secondary | ICD-10-CM

## 2014-06-16 LAB — BASIC METABOLIC PANEL
BUN: 15 mg/dL (ref 6–23)
CHLORIDE: 108 meq/L (ref 96–112)
CO2: 27 mEq/L (ref 19–32)
CREATININE: 0.97 mg/dL (ref 0.40–1.20)
Calcium: 8.6 mg/dL (ref 8.4–10.5)
GFR: 64.08 mL/min (ref >60.00)
Glucose, Bld: 88 mg/dL (ref 70–99)
Potassium: 4 mEq/L (ref 3.5–5.1)
Sodium: 138 mEq/L (ref 135–145)

## 2014-06-16 LAB — HEPATIC FUNCTION PANEL
ALT: 13 U/L (ref 0–35)
AST: 17 U/L (ref 0–37)
Albumin: 3.4 g/dL — ABNORMAL LOW (ref 3.5–5.2)
Alkaline Phosphatase: 50 U/L (ref 39–117)
BILIRUBIN TOTAL: 0.8 mg/dL (ref 0.2–1.2)
Bilirubin, Direct: 0.2 mg/dL (ref 0.0–0.3)
TOTAL PROTEIN: 6.3 g/dL (ref 6.0–8.3)

## 2014-06-16 LAB — LIPID PANEL
CHOLESTEROL: 168 mg/dL (ref 0–200)
HDL: 48 mg/dL (ref >39.00)
LDL Cholesterol: 103 mg/dL — ABNORMAL HIGH (ref 0–99)
NonHDL: 120
TRIGLYCERIDES: 84 mg/dL (ref 0.0–149.0)
Total CHOL/HDL Ratio: 4
VLDL: 16.8 mg/dL (ref 0.0–40.0)

## 2014-06-16 MED ORDER — ATORVASTATIN CALCIUM 20 MG PO TABS: 20.0000 mg | ORAL_TABLET | Freq: Every day | ORAL | Status: DC

## 2014-06-16 MED ORDER — MONTELUKAST SODIUM 10 MG PO TABS: ORAL_TABLET | ORAL | Status: DC

## 2014-06-16 MED ORDER — ESCITALOPRAM OXALATE 10 MG PO TABS: ORAL_TABLET | ORAL | Status: DC

## 2014-06-16 MED ORDER — VALSARTAN 80 MG PO TABS: 80.0000 mg | ORAL_TABLET | Freq: Every day | ORAL | Status: DC

## 2014-06-16 MED ORDER — VALSARTAN 160 MG PO TABS: 160.0000 mg | ORAL_TABLET | Freq: Every day | ORAL | Status: DC

## 2014-06-16 NOTE — Assessment & Plan Note (Signed)
Slightly elevated Inc diovan to 160 mg daily

## 2014-06-16 NOTE — Assessment & Plan Note (Addendum)
Check labs 

## 2014-06-16 NOTE — Progress Notes (Signed)
Patient ID: Sandy Green, female    DOB: Jan 25, 1961  Age: 54 y.o. MRN: [id number]    Subjective:  Subjective HPI Sandy Green presents for f/u bp, and cholesterol.  Pt is struggling with allergies and is using saline spray.  She has ? About otc med.    Review of Systems  Constitutional: Negative for activity change, appetite change, fatigue and unexpected weight change.  Respiratory: Negative for cough and shortness of breath.   Cardiovascular: Negative for chest pain and palpitations.  Endocrine: Negative for heat intolerance.  Psychiatric/Behavioral: Negative for behavioral problems and dysphoric mood. The patient is not nervous/anxious.     History Past Medical History  Diagnosis Date  . Asthma   . Chicken pox   . Heart murmur   . Hypertension   . Hyperlipemia   . Ankle fracture, left     She has no past surgical history on file.   Her family history includes Cancer in her maternal grandmother; Diabetes in her maternal grandmother; Heart disease in her father; Hypertension in her father and mother.She reports that she has never smoked. She has never used smokeless tobacco. She reports that she does not drink alcohol or use illicit drugs.  Current Outpatient Prescriptions on File Prior to Visit  Medication Sig Dispense Refill  . albuterol (PROVENTIL HFA;VENTOLIN HFA) 108 (90 BASE) MCG/ACT inhaler Inhale 1-2 puffs into the lungs every 6 (six) hours as needed for wheezing or shortness of breath. 1 Inhaler 0  . fluticasone (FLOVENT HFA) 220 MCG/ACT inhaler Inhale 1 puff into the lungs 2 (two) times daily.    . norethindrone-ethinyl estradiol-iron (MICROGESTIN FE,GILDESS FE,LOESTRIN FE) 1.5-30 MG-MCG tablet Take 1 tablet by mouth daily. 3 Package 4   No current facility-administered medications on file prior to visit.     Objective:  Objective Physical Exam  Constitutional: She is oriented to person, place, and time. She appears well-developed and well-nourished. No distress.    HENT:  Right Ear: External ear normal.  Left Ear: External ear normal.  Nose: Nose normal.  Mouth/Throat: Oropharynx is clear and moist.  Eyes: EOM are normal. Pupils are equal, round, and reactive to light.  Neck: Normal range of motion. Neck supple.  Cardiovascular: Normal rate, regular rhythm and normal heart sounds.   No murmur heard. Pulmonary/Chest: Effort normal and breath sounds normal. No respiratory distress. She has no wheezes. She has no rales. She exhibits no tenderness.  Neurological: She is alert and oriented to person, place, and time.  Psychiatric: She has a normal mood and affect. Her behavior is normal. Judgment and thought content normal.   BP 132/88 mmHg  Pulse 83  Temp(Src) 98.3 F (36.8 C) (Oral)  Wt 189 lb (85.73 kg)  SpO2 97% Wt Readings from Last 3 Encounters:  06/16/14 189 lb (85.73 kg)  05/29/14 189 lb 6.4 oz (85.911 kg)  04/09/14 188 lb 9.6 oz (85.548 kg)     Lab Results  Component Value Date   WBC 9.1 08/27/2013   HGB 13.4 08/27/2013   HCT 40.2 08/27/2013   PLT 310.0 08/27/2013   GLUCOSE 84 03/03/2014   CHOL 202* 03/03/2014   TRIG 85.0 03/03/2014   HDL 50.80 03/03/2014   LDLCALC 134* 03/03/2014   ALT 18 03/03/2014   AST 22 03/03/2014   NA 136 03/03/2014   K 3.6 03/03/2014   CL 104 03/03/2014   CREATININE 1.0 03/03/2014   BUN 16 03/03/2014   CO2 25 03/03/2014   TSH 1.74 08/27/2013  No results found.   Assessment & Plan:  Plan I am having Ms. Cardamone maintain her fluticasone, norethindrone-ethinyl estradiol-iron, albuterol, montelukast, escitalopram, atorvastatin, and valsartan.  Meds ordered this encounter  Medications  . DISCONTD: valsartan (DIOVAN) 80 MG tablet    Sig: Take 1 tablet (80 mg total) by mouth daily.    Dispense:  30 tablet    Refill:  0  . montelukast (SINGULAIR) 10 MG tablet    Sig: TAKE 1 TABLET BY MOUTH AT BEDTIME    Dispense:  90 tablet    Refill:  3  . escitalopram (LEXAPRO) 10 MG tablet    Sig: TAKE 1  TABLET (10 MG TOTAL) BY MOUTH DAILY.    Dispense:  90 tablet    Refill:  3  . atorvastatin (LIPITOR) 20 MG tablet    Sig: Take 1 tablet (20 mg total) by mouth daily.    Dispense:  90 tablet    Refill:  1  . DISCONTD: valsartan (DIOVAN) 160 MG tablet    Sig: Take 1 tablet (160 mg total) by mouth daily.    Dispense:  90 tablet    Refill:  1  . valsartan (DIOVAN) 160 MG tablet    Sig: Take 1 tablet (160 mg total) by mouth daily.    Dispense:  30 tablet    Refill:  0    Problem List Items Addressed This Visit      Unprioritized   Hyperlipidemia    Check labs      Relevant Medications   atorvastatin (LIPITOR) tablet   valsartan (DIOVAN) tablet   HTN (hypertension) - Primary    Slightly elevated Inc diovan to 160 mg daily      Relevant Medications   atorvastatin (LIPITOR) tablet   valsartan (DIOVAN) tablet   Other Relevant Orders   Basic metabolic panel    Other Visit Diagnoses    Asthma, unspecified asthma severity, uncomplicated        Relevant Medications    montelukast (SINGULAIR) 10 MG tablet    Anxiety        Relevant Medications    escitalopram (LEXAPRO) tablet    Hyperlipemia        Relevant Medications    atorvastatin (LIPITOR) tablet    valsartan (DIOVAN) tablet    Other Relevant Orders    Hepatic function panel    Lipid panel       Follow-up: Return in about 3 months (around 09/16/2014) for bp f.u.  Sandy Koyanagi, DO

## 2014-06-16 NOTE — Progress Notes (Signed)
Pre visit review using our clinic review tool, if applicable. No additional management support is needed unless otherwise documented below in the visit note. 

## 2014-06-16 NOTE — Patient Instructions (Signed)

## 2014-09-15 ENCOUNTER — Ambulatory Visit (INDEPENDENT_AMBULATORY_CARE_PROVIDER_SITE_OTHER): Payer: Managed Care, Other (non HMO) | Admitting: Family Medicine

## 2014-09-15 ENCOUNTER — Encounter: Payer: Self-pay | Admitting: Family Medicine

## 2014-09-15 VITALS — BP 130/84 | HR 91 | Temp 98.6°F | Ht 66.0 in | Wt 185.0 lb

## 2014-09-15 DIAGNOSIS — J4520 Mild intermittent asthma, uncomplicated: Secondary | ICD-10-CM

## 2014-09-15 DIAGNOSIS — Z91048 Other nonmedicinal substance allergy status: Secondary | ICD-10-CM

## 2014-09-15 DIAGNOSIS — H1013 Acute atopic conjunctivitis, bilateral: Secondary | ICD-10-CM

## 2014-09-15 DIAGNOSIS — Z9109 Other allergy status, other than to drugs and biological substances: Secondary | ICD-10-CM

## 2014-09-15 MED ORDER — AZELASTINE HCL 0.05 % OP SOLN: 2.0000 [drp] | Freq: Two times a day (BID) | OPHTHALMIC | Status: DC

## 2014-09-15 MED ORDER — FLUTICASONE PROPIONATE HFA 220 MCG/ACT IN AERO: 1.0000 | INHALATION_SPRAY | Freq: Two times a day (BID) | RESPIRATORY_TRACT | Status: DC

## 2014-09-15 NOTE — Progress Notes (Signed)
Pre visit review using our clinic review tool, if applicable. No additional management support is needed unless otherwise documented below in the visit note. 

## 2014-09-15 NOTE — Progress Notes (Signed)
Subjective:    Patient ID: Sandy Green, female    DOB: [birth year], 54 y.o.   MRN: [id number]  HPI  Patient here f/u and needs refills.  She is also c/o allergies that are worsening.    Past Medical History  Diagnosis Date  . Asthma   . Chicken pox   . Heart murmur   . Hypertension   . Hyperlipemia   . Ankle fracture, left     Review of Systems  Constitutional: Negative for activity change, appetite change, fatigue and unexpected weight change.  Respiratory: Negative for cough and shortness of breath.   Cardiovascular: Negative for chest pain and palpitations.  Allergic/Immunologic: Positive for environmental allergies. Negative for food allergies.  Psychiatric/Behavioral: Negative for behavioral problems and dysphoric mood. The patient is not nervous/anxious.     Current Outpatient Prescriptions on File Prior to Visit  Medication Sig Dispense Refill  . albuterol (PROVENTIL HFA;VENTOLIN HFA) 108 (90 BASE) MCG/ACT inhaler Inhale 1-2 puffs into the lungs every 6 (six) hours as needed for wheezing or shortness of breath. 1 Inhaler 0  . atorvastatin (LIPITOR) 20 MG tablet Take 1 tablet (20 mg total) by mouth daily. 90 tablet 1  . escitalopram (LEXAPRO) 10 MG tablet TAKE 1 TABLET (10 MG TOTAL) BY MOUTH DAILY. 90 tablet 3  . montelukast (SINGULAIR) 10 MG tablet TAKE 1 TABLET BY MOUTH AT BEDTIME 90 tablet 3  . norethindrone-ethinyl estradiol-iron (MICROGESTIN FE,GILDESS FE,LOESTRIN FE) 1.5-30 MG-MCG tablet Take 1 tablet by mouth daily. 3 Package 4  . valsartan (DIOVAN) 160 MG tablet Take 1 tablet (160 mg total) by mouth daily. 30 tablet 0   No current facility-administered medications on file prior to visit.       Objective:    Physical Exam  Constitutional: She is oriented to person, place, and time. She appears well-developed and well-nourished.  HENT:  Head: Normocephalic and atraumatic.  Eyes: Lids are normal. Pupils are equal, round, and reactive to light. Right conjunctiva  is injected. Left conjunctiva is injected.  Neck: Normal range of motion. Neck supple. No JVD present. Carotid bruit is not present. No thyromegaly present.  Cardiovascular: Normal rate, regular rhythm and normal heart sounds.   No murmur heard. Pulmonary/Chest: Effort normal and breath sounds normal. No respiratory distress. She has no wheezes. She has no rales. She exhibits no tenderness.  Musculoskeletal: She exhibits no edema.  Neurological: She is alert and oriented to person, place, and time.  Psychiatric: She has a normal mood and affect. Her behavior is normal.    BP 130/84 mmHg  Pulse 91  Temp(Src) 98.6 F (37 C) (Oral)  Ht 5\' 6"  (1.676 m)  Wt 185 lb (83.915 kg)  BMI 29.87 kg/m2  SpO2 97% Wt Readings from Last 3 Encounters:  09/15/14 185 lb (83.915 kg)  06/16/14 189 lb (85.73 kg)  05/29/14 189 lb 6.4 oz (85.911 kg)     Lab Results  Component Value Date   WBC 9.1 08/27/2013   HGB 13.4 08/27/2013   HCT 40.2 08/27/2013   PLT 310.0 08/27/2013   GLUCOSE 88 06/16/2014   CHOL 168 06/16/2014   TRIG 84.0 06/16/2014   HDL 48.00 06/16/2014   LDLCALC 103* 06/16/2014   ALT 13 06/16/2014   AST 17 06/16/2014   NA 138 06/16/2014   K 4.0 06/16/2014   CL 108 06/16/2014   CREATININE 0.97 06/16/2014   BUN 15 06/16/2014   CO2 27 06/16/2014   TSH 1.74 08/27/2013  Assessment & Plan:   Problem List Items Addressed This Visit    None    Visit Diagnoses    Asthma, chronic, mild intermittent, uncomplicated    -  Primary    Relevant Medications    fluticasone (FLOVENT HFA) 220 MCG/ACT inhaler    Allergic conjunctivitis, bilateral        Relevant Medications    azelastine (OPTIVAR) 0.05 % ophthalmic solution    Other Relevant Orders    Ambulatory referral to Allergy    Sandy Green allergy panel    Environmental allergies        Relevant Orders    Ambulatory referral to Allergy    Sandy Green allergy panel       I am having Ms. Sandy Green start on azelastine. I am also having her  maintain her norethindrone-ethinyl estradiol-iron, albuterol, montelukast, escitalopram, atorvastatin, valsartan, and fluticasone.  Meds ordered this encounter  Medications  . fluticasone (FLOVENT HFA) 220 MCG/ACT inhaler    Sig: Inhale 1 puff into the lungs 2 (two) times daily.    Dispense:  3 Inhaler    Refill:  3  . azelastine (OPTIVAR) 0.05 % ophthalmic solution    Sig: Place 2 drops into both eyes 2 (two) times daily.    Dispense:  6 mL    Refill:  [place], DO

## 2014-09-15 NOTE — Patient Instructions (Signed)

## 2014-09-16 LAB — ~~LOC~~ ALLERGY PANEL
Allergen, Cedar tree, t12: <0.1 kU/L
Allergen, Comm Silver Birch, t9: <0.1 kU/L
Allergen, D pternoyssinus,d7: 0.17 kU/L — ABNORMAL HIGH
Aspergillus fumigatus, m3: <0.1 kU/L
Bahia Grass: <0.1 kU/L
Bermuda Grass: <0.1 kU/L
Box Elder IgE: <0.1 kU/L
Cladosporium Herbarum: <0.1 kU/L
Cockroach: <0.1 kU/L
D. farinae: 0.13 kU/L — ABNORMAL HIGH
Elm IgE: <0.1 kU/L
Mucor Racemosus: <0.1 kU/L
Nettle: <0.1 kU/L
Oak: <0.1 kU/L
Pecan/Hickory Tree IgE: <0.1 kU/L
Penicillium Notatum: <0.1 kU/L
Rough Pigweed  IgE: <0.1 kU/L
Sheep Sorrel IgE: <0.1 kU/L
Timothy Grass: <0.1 kU/L

## 2014-09-18 ENCOUNTER — Encounter: Payer: Self-pay | Admitting: *Deleted

## 2014-09-22 ENCOUNTER — Ambulatory Visit: Payer: Managed Care, Other (non HMO) | Admitting: Family Medicine

## 2014-11-11 ENCOUNTER — Other Ambulatory Visit: Payer: Self-pay | Admitting: Family Medicine

## 2015-01-01 ENCOUNTER — Encounter: Payer: Self-pay | Admitting: Internal Medicine

## 2015-01-01 ENCOUNTER — Other Ambulatory Visit (INDEPENDENT_AMBULATORY_CARE_PROVIDER_SITE_OTHER): Payer: Managed Care, Other (non HMO)

## 2015-01-01 ENCOUNTER — Ambulatory Visit (INDEPENDENT_AMBULATORY_CARE_PROVIDER_SITE_OTHER): Payer: Managed Care, Other (non HMO) | Admitting: Internal Medicine

## 2015-01-01 VITALS — BP 110/60 | HR 77 | Ht 66.0 in | Wt 186.8 lb

## 2015-01-01 DIAGNOSIS — L509 Urticaria, unspecified: Secondary | ICD-10-CM | POA: Diagnosis not present

## 2015-01-01 DIAGNOSIS — Z889 Allergy status to unspecified drugs, medicaments and biological substances status: Secondary | ICD-10-CM

## 2015-01-01 DIAGNOSIS — Z9109 Other allergy status, other than to drugs and biological substances: Secondary | ICD-10-CM

## 2015-01-01 DIAGNOSIS — R21 Rash and other nonspecific skin eruption: Secondary | ICD-10-CM

## 2015-01-01 LAB — CBC WITH DIFFERENTIAL/PLATELET
Basophils Absolute: 0.1 10*3/uL (ref 0.0–0.1)
Basophils Relative: 1.4 % (ref 0.0–3.0)
EOS PCT: 5.6 % — AB (ref 0.0–5.0)
Eosinophils Absolute: 0.4 10*3/uL (ref 0.0–0.7)
HCT: 41.5 % (ref 36.0–46.0)
Hemoglobin: 13.8 g/dL (ref 12.0–15.0)
LYMPHS ABS: 2.1 10*3/uL (ref 0.7–4.0)
Lymphocytes Relative: 29.7 % (ref 12.0–46.0)
MCHC: 33.3 g/dL (ref 30.0–36.0)
MCV: 90.8 fl (ref 78.0–100.0)
MONO ABS: 0.6 10*3/uL (ref 0.1–1.0)
MONOS PCT: 8.1 % (ref 3.0–12.0)
NEUTROS ABS: 3.9 10*3/uL (ref 1.4–7.7)
NEUTROS PCT: 55.2 % (ref 43.0–77.0)
PLATELETS: 289 10*3/uL (ref 150.0–400.0)
RBC: 4.57 Mil/uL (ref 3.87–5.11)
RDW: 13.2 % (ref 11.5–15.5)
WBC: 7.1 10*3/uL (ref 4.0–10.5)

## 2015-01-01 LAB — SEDIMENTATION RATE: Sed Rate: 20 mm/hr (ref 0–22)

## 2015-01-01 NOTE — Patient Instructions (Signed)
H1 antihistamine- one daily:    Claritin/ loratadine, Allegra/ fexofenadine, Zyrtec/ cetirizine  H2 antihistamine- one daily:    Pepcid/famotadine, or tagamet or Zantac  Treat face as acne with otcs- see if that helps  Order lab- Total IgE, CBC w diff, Sed rate  If no better, then suggest dermatologist

## 2015-01-01 NOTE — Assessment & Plan Note (Signed)
Not clear that an allergic process has been defined. She has some occasional nasal congestion which might be allergic rhinitis. Skin rash is being evaluated.

## 2015-01-01 NOTE — Progress Notes (Signed)
01/01/15- 24 yoF never smoker Ref. by Dr. Etter Sjogren for 2 yrs: Breaks out on face-itchy,not red after yard work.Nasal congestion off/on.No cough. Occass. PND. [facility] panel 09/15/14- elevations for dust mites only She is concerned about skin rash confined to face and neck, aggravated by being outdoors. Onset after she moved back to [place] from [place] in 2014. Small pruritic spots began on skin a few hours after she has been outdoors. Treating with Caladryl. Has tried hypoallergenic soaps. Benadryl no help. Lesions individually come and go. Allergy skin testing 30 years ago positive for dust and rabbits. She keeps pet rabbits. At that time exposure caused asthma and she was on allergy vaccine. No asthma in many years. She credits maintenance Singulair without using rescue inhaler. No recognized problem with food, insect stings, latex, aspirin  Prior to Admission medications   Medication Sig Start Date End Date Taking? Authorizing Provider  albuterol (PROVENTIL HFA;VENTOLIN HFA) 108 (90 BASE) MCG/ACT inhaler Inhale 1-2 puffs into the lungs every 6 (six) hours as needed for wheezing or shortness of breath. 05/03/14  Yes Harden Mo, MD  atorvastatin (LIPITOR) 20 MG tablet Take 1 tablet (20 mg total) by mouth daily. 06/16/14  Yes Rosalita Chessman, DO  diphenhydrAMINE (BENADRYL) 25 mg capsule Take 25 mg by mouth every 6 (six) hours as needed.   Yes Historical Provider, MD  escitalopram (LEXAPRO) 10 MG tablet TAKE 1 TABLET (10 MG TOTAL) BY MOUTH DAILY. 06/16/14  Yes Yvonne R Lowne, DO  fluticasone (FLOVENT HFA) 220 MCG/ACT inhaler Inhale 1 puff into the lungs 2 (two) times daily. 09/15/14  Yes Yvonne R Lowne, DO  montelukast (SINGULAIR) 10 MG tablet TAKE 1 TABLET BY MOUTH AT BEDTIME 06/16/14  Yes Rosalita Chessman, DO  norethindrone-ethinyl estradiol-iron (MICROGESTIN FE,GILDESS FE,LOESTRIN FE) 1.5-30 MG-MCG tablet Take 1 tablet by mouth daily. 03/06/14  Yes Yvonne R Lowne, DO  valsartan (DIOVAN) 160 MG  tablet TAKE 1 TABLET BY MOUTH DAILY 11/12/14  Yes Rosalita Chessman, DO   Past Medical History  Diagnosis Date  . Asthma   . Chicken pox   . Heart murmur   . Hypertension   . Hyperlipemia   . Ankle fracture, left   . Allergy    No past surgical history on file. Family History  Problem Relation Age of Onset  . Diabetes Maternal Grandmother   . Cancer Maternal Grandmother     Unsure of kind? Breast  . Hypertension Mother   . Hypertension Father   . Heart disease Father     cabg.[email or web address]   Social History   Social History  . Marital Status: Married    Spouse Name: N/A  . Number of Children: N/A  . Years of Education: N/A   Occupational History  . Not on file.   Social History Main Topics  . Smoking status: Never Smoker   . Smokeless tobacco: Never Used  . Alcohol Use: No  . Drug Use: No  . Sexual Activity:    Partners: Male   Other Topics Concern  . Not on file   Social History Narrative   Exercise-- no    Married,lives with husband.   Unemployed.   ROS-see HPI   Negative unless "+" Constitutional:    weight loss, night sweats, fevers, chills, fatigue, lassitude. HEENT:    headaches, difficulty swallowing, tooth/dental problems, sore throat,       sneezing, + itching, ear ache, + nasal congestion, post nasal drip, snoring CV:    chest  pain, orthopnea, PND, swelling in lower extremities, anasarca,                                                     dizziness, palpitations Resp:   shortness of breath with exertion or at rest.                productive cough,   non-productive cough, coughing up of blood.              change in color of mucus.  wheezing.   Skin:   + HPI GI:  No-   heartburn, indigestion, abdominal pain, nausea, vomiting, diarrhea,                 change in bowel habits, loss of appetite GU: dysuria, change in color of urine, no urgency or frequency.   flank pain. MS:   joint pain, stiffness, decreased range of motion, back pain. Neuro-     nothing  unusual Psych:  change in mood or affect.  depression or anxiety.   memory loss.  OBJ- Physical Exam General- Alert, Oriented, Affect-appropriate, Distress- none acute Skin- + small isolated punctate slightly raised red lesions mostly on lower face Lymphadenopathy- none Head- atraumatic            Eyes- Gross vision intact, PERRLA, conjunctivae and secretions clear            Ears- Hearing, canals-normal            Nose- Clear, no-Septal dev, mucus, polyps, erosion, perforation             Throat- Mallampati II , mucosa clear , drainage- none, tonsils- atrophic Neck- flexible , trachea midline, no stridor , thyroid nl, carotid no bruit Chest - symmetrical excursion , unlabored           Heart/CV- RRR , no murmur , no gallop  , no rub, nl s1 s2                           - JVD- none , edema- none, stasis changes- none, varices- none           Lung- clear to P&A, wheeze- none, cough- none , dullness-none, rub- none           Chest wall-  Abd-  Br/ Gen/ Rectal- Not done, not indicated Extrem- cyanosis- none, clubbing, none, atrophy- none, strength- nl Neuro- grossly intact to observation

## 2015-01-01 NOTE — Assessment & Plan Note (Signed)
Could be photosensitivity or insect bites but persistent and apparently not seasonal. Punctate urticaria is a possibility. Plan-try combination H1 and H2 antihistamines. Lab for IgE CBC with differential, sedimentation rate. If no better recommend dermatologist

## 2015-01-02 LAB — IGE: IgE (Immunoglobulin E), Serum: 418 kU/L — ABNORMAL HIGH (ref <115)

## 2015-03-20 ENCOUNTER — Telehealth: Payer: Self-pay

## 2015-03-20 NOTE — Telephone Encounter (Signed)
Left message for patient to call back regarding pre-Visit information.

## 2015-03-23 ENCOUNTER — Other Ambulatory Visit (HOSPITAL_COMMUNITY)
Admission: RE | Admit: 2015-03-23 | Discharge: 2015-03-23 | Disposition: A | Discharge status: Home / Self Care | Payer: Managed Care, Other (non HMO) | Source: Ambulatory Visit | Attending: Family Medicine | Admitting: Family Medicine

## 2015-03-23 ENCOUNTER — Encounter: Payer: Self-pay | Admitting: Family Medicine

## 2015-03-23 ENCOUNTER — Ambulatory Visit (INDEPENDENT_AMBULATORY_CARE_PROVIDER_SITE_OTHER): Payer: Managed Care, Other (non HMO) | Admitting: Family Medicine

## 2015-03-23 VITALS — BP 126/80 | HR 87 | Temp 98.2°F | Ht 66.0 in | Wt 193.4 lb

## 2015-03-23 DIAGNOSIS — Z1159 Encounter for screening for other viral diseases: Secondary | ICD-10-CM | POA: Diagnosis not present

## 2015-03-23 DIAGNOSIS — Z01419 Encounter for gynecological examination (general) (routine) without abnormal findings: Secondary | ICD-10-CM | POA: Insufficient documentation

## 2015-03-23 DIAGNOSIS — Z1239 Encounter for other screening for malignant neoplasm of breast: Secondary | ICD-10-CM

## 2015-03-23 DIAGNOSIS — N760 Acute vaginitis: Secondary | ICD-10-CM

## 2015-03-23 DIAGNOSIS — Z0000 Encounter for general adult medical examination without abnormal findings: Secondary | ICD-10-CM

## 2015-03-23 MED ORDER — VALSARTAN 160 MG PO TABS: 160.0000 mg | ORAL_TABLET | Freq: Every day | ORAL | Status: DC

## 2015-03-23 MED ORDER — FLUCONAZOLE 150 MG PO TABS: 150.0000 mg | ORAL_TABLET | Freq: Once | ORAL | Status: DC

## 2015-03-23 MED ORDER — NORETHIN ACE-ETH ESTRAD-FE 1.5-30 MG-MCG PO TABS: 1.0000 | ORAL_TABLET | Freq: Every day | ORAL | Status: DC

## 2015-03-23 NOTE — Progress Notes (Signed)
Pre visit review using our clinic review tool, if applicable. No additional management support is needed unless otherwise documented below in the visit note. 

## 2015-03-23 NOTE — Patient Instructions (Signed)
Preventive Care for Adults, Female A healthy lifestyle and preventive care can promote health and wellness. Preventive health guidelines for women include the following key practices.  A routine yearly physical is a good way to check with your health care provider about your health and preventive screening. It is a chance to share any concerns and updates on your health and to receive a thorough exam.  Visit your dentist for a routine exam and preventive care every 6 months. Brush your teeth twice a day and floss once a day. Good oral hygiene prevents tooth decay and gum disease.  The frequency of eye exams is based on your age, health, family medical history, use of contact lenses, and other factors. Follow your health care provider's recommendations for frequency of eye exams.  Eat a healthy diet. Foods like vegetables, fruits, whole grains, low-fat dairy products, and lean protein foods contain the nutrients you need without too many calories. Decrease your intake of foods high in solid fats, added sugars, and salt. Eat the right amount of calories for you.Get information about a proper diet from your health care provider, if necessary.  Regular physical exercise is one of the most important things you can do for your health. Most adults should get at least 150 minutes of moderate-intensity exercise (any activity that increases your heart rate and causes you to sweat) each week. In addition, most adults need muscle-strengthening exercises on 2 or more days a week.  Maintain a healthy weight. The body mass index (BMI) is a screening tool to identify possible weight problems. It provides an estimate of body fat based on height and weight. Your health care provider can find your BMI and can help you achieve or maintain a healthy weight.For adults 20 years and older:  A BMI below 18.5 is considered underweight.  A BMI of 18.5 to 24.9 is normal.  A BMI of 25 to 29.9 is considered overweight.  A  BMI of 30 and above is considered obese.  Maintain normal blood lipids and cholesterol levels by exercising and minimizing your intake of saturated fat. Eat a balanced diet with plenty of fruit and vegetables. Blood tests for lipids and cholesterol should begin at age 45 and be repeated every 5 years. If your lipid or cholesterol levels are high, you are over 50, or you are at high risk for heart disease, you may need your cholesterol levels checked more frequently.Ongoing high lipid and cholesterol levels should be treated with medicines if diet and exercise are not working.  If you smoke, find out from your health care provider how to quit. If you do not use tobacco, do not start.  Lung cancer screening is recommended for adults aged 45-80 years who are at high risk for developing lung cancer because of a history of smoking. A yearly low-dose CT scan of the lungs is recommended for people who have at least a 30-pack-year history of smoking and are a current smoker or have quit within the past 15 years. A pack year of smoking is smoking an average of 1 pack of cigarettes a day for 1 year (for example: 1 pack a day for 30 years or 2 packs a day for 15 years). Yearly screening should continue until the smoker has stopped smoking for at least 15 years. Yearly screening should be stopped for people who develop a health problem that would prevent them from having lung cancer treatment.  If you are pregnant, do not drink alcohol. If you are  breastfeeding, be very cautious about drinking alcohol. If you are not pregnant and choose to drink alcohol, do not have more than 1 drink per day. One drink is considered to be 12 ounces (355 mL) of beer, 5 ounces (148 mL) of wine, or 1.5 ounces (44 mL) of liquor.  Avoid use of street drugs. Do not share needles with anyone. Ask for help if you need support or instructions about stopping the use of drugs.  High blood pressure causes heart disease and increases the risk  of stroke. Your blood pressure should be checked at least every 1 to 2 years. Ongoing high blood pressure should be treated with medicines if weight loss and exercise do not work.  If you are 55-79 years old, ask your health care provider if you should take aspirin to prevent strokes.  Diabetes screening is done by taking a blood sample to check your blood glucose level after you have not eaten for a certain period of time (fasting). If you are not overweight and you do not have risk factors for diabetes, you should be screened once every 3 years starting at age 45. If you are overweight or obese and you are 40-70 years of age, you should be screened for diabetes every year as part of your cardiovascular risk assessment.  Breast cancer screening is essential preventive care for women. You should practice "breast self-awareness." This means understanding the normal appearance and feel of your breasts and may include breast self-examination. Any changes detected, no matter how small, should be reported to a health care provider. Women in their 20s and 30s should have a clinical breast exam (CBE) by a health care provider as part of a regular health exam every 1 to 3 years. After age 40, women should have a CBE every year. Starting at age 40, women should consider having a mammogram (breast X-ray test) every year. Women who have a family history of breast cancer should talk to their health care provider about genetic screening. Women at a high risk of breast cancer should talk to their health care providers about having an MRI and a mammogram every year.  Breast cancer gene (BRCA)-related cancer risk assessment is recommended for women who have family members with BRCA-related cancers. BRCA-related cancers include breast, ovarian, tubal, and peritoneal cancers. Having family members with these cancers may be associated with an increased risk for harmful changes (mutations) in the breast cancer genes BRCA1 and  BRCA2. Results of the assessment will determine the need for genetic counseling and BRCA1 and BRCA2 testing.  Your health care provider may recommend that you be screened regularly for cancer of the pelvic organs (ovaries, uterus, and vagina). This screening involves a pelvic examination, including checking for microscopic changes to the surface of your cervix (Pap test). You may be encouraged to have this screening done every 3 years, beginning at age 21.  For women ages 30-65, health care providers may recommend pelvic exams and Pap testing every 3 years, or they may recommend the Pap and pelvic exam, combined with testing for human papilloma virus (HPV), every 5 years. Some types of HPV increase your risk of cervical cancer. Testing for HPV may also be done on women of any age with unclear Pap test results.  Other health care providers may not recommend any screening for nonpregnant women who are considered low risk for pelvic cancer and who do not have symptoms. Ask your health care provider if a screening pelvic exam is right for   you.  If you have had past treatment for cervical cancer or a condition that could lead to cancer, you need Pap tests and screening for cancer for at least 20 years after your treatment. If Pap tests have been discontinued, your risk factors (such as having a new sexual partner) need to be reassessed to determine if screening should resume. Some women have medical problems that increase the chance of getting cervical cancer. In these cases, your health care provider may recommend more frequent screening and Pap tests.  Colorectal cancer can be detected and often prevented. Most routine colorectal cancer screening begins at the age of 50 years and continues through age 75 years. However, your health care provider may recommend screening at an earlier age if you have risk factors for colon cancer. On a yearly basis, your health care provider may provide home test kits to check  for hidden blood in the stool. Use of a small camera at the end of a tube, to directly examine the colon (sigmoidoscopy or colonoscopy), can detect the earliest forms of colorectal cancer. Talk to your health care provider about this at age 50, when routine screening begins. Direct exam of the colon should be repeated every 5-10 years through age 75 years, unless early forms of precancerous polyps or small growths are found.  People who are at an increased risk for hepatitis B should be screened for this virus. You are considered at high risk for hepatitis B if:  You were born in a country where hepatitis B occurs often. Talk with your health care provider about which countries are considered high risk.  Your parents were born in a high-risk country and you have not received a shot to protect against hepatitis B (hepatitis B vaccine).  You have HIV or AIDS.  You use needles to inject street drugs.  You live with, or have sex with, someone who has hepatitis B.  You get hemodialysis treatment.  You take certain medicines for conditions like cancer, organ transplantation, and autoimmune conditions.  Hepatitis C blood testing is recommended for all people born from 1945 through 1965 and any individual with known risks for hepatitis C.  Practice safe sex. Use condoms and avoid high-risk sexual practices to reduce the spread of sexually transmitted infections (STIs). STIs include gonorrhea, chlamydia, syphilis, trichomonas, herpes, HPV, and human immunodeficiency virus (HIV). Herpes, HIV, and HPV are viral illnesses that have no cure. They can result in disability, cancer, and death.  You should be screened for sexually transmitted illnesses (STIs) including gonorrhea and chlamydia if:  You are sexually active and are younger than 24 years.  You are older than 24 years and your health care provider tells you that you are at risk for this type of infection.  Your sexual activity has changed  since you were last screened and you are at an increased risk for chlamydia or gonorrhea. Ask your health care provider if you are at risk.  If you are at risk of being infected with HIV, it is recommended that you take a prescription medicine daily to prevent HIV infection. This is called preexposure prophylaxis (PrEP). You are considered at risk if:  You are sexually active and do not regularly use condoms or know the HIV status of your partner(s).  You take drugs by injection.  You are sexually active with a partner who has HIV.  Talk with your health care provider about whether you are at high risk of being infected with HIV. If   you choose to begin PrEP, you should first be tested for HIV. You should then be tested every 3 months for as long as you are taking PrEP.  Osteoporosis is a disease in which the bones lose minerals and strength with aging. This can result in serious bone fractures or breaks. The risk of osteoporosis can be identified using a bone density scan. Women ages 67 years and over and women at risk for fractures or osteoporosis should discuss screening with their health care providers. Ask your health care provider whether you should take a calcium supplement or vitamin D to reduce the rate of osteoporosis.  Menopause can be associated with physical symptoms and risks. Hormone replacement therapy is available to decrease symptoms and risks. You should talk to your health care provider about whether hormone replacement therapy is right for you.  Use sunscreen. Apply sunscreen liberally and repeatedly throughout the day. You should seek shade when your shadow is shorter than you. Protect yourself by wearing long sleeves, pants, a wide-brimmed hat, and sunglasses year round, whenever you are outdoors.  Once a month, do a whole body skin exam, using a mirror to look at the skin on your back. Tell your health care provider of new moles, moles that have irregular borders, moles that  are larger than a pencil eraser, or moles that have changed in shape or color.  Stay current with required vaccines (immunizations).  Influenza vaccine. All adults should be immunized every year.  Tetanus, diphtheria, and acellular pertussis (Td, Tdap) vaccine. Pregnant women should receive 1 dose of Tdap vaccine during each pregnancy. The dose should be obtained regardless of the length of time since the last dose. Immunization is preferred during the 27th-36th week of gestation. An adult who has not previously received Tdap or who does not know her vaccine status should receive 1 dose of Tdap. This initial dose should be followed by tetanus and diphtheria toxoids (Td) booster doses every 10 years. Adults with an unknown or incomplete history of completing a 3-dose immunization series with Td-containing vaccines should begin or complete a primary immunization series including a Tdap dose. Adults should receive a Td booster every 10 years.  Varicella vaccine. An adult without evidence of immunity to varicella should receive 2 doses or a second dose if she has previously received 1 dose. Pregnant females who do not have evidence of immunity should receive the first dose after pregnancy. This first dose should be obtained before leaving the health care facility. The second dose should be obtained 4-8 weeks after the first dose.  Human papillomavirus (HPV) vaccine. Females aged 13-26 years who have not received the vaccine previously should obtain the 3-dose series. The vaccine is not recommended for use in pregnant females. However, pregnancy testing is not needed before receiving a dose. If a female is found to be pregnant after receiving a dose, no treatment is needed. In that case, the remaining doses should be delayed until after the pregnancy. Immunization is recommended for any person with an immunocompromised condition through the age of 61 years if she did not get any or all doses earlier. During the  3-dose series, the second dose should be obtained 4-8 weeks after the first dose. The third dose should be obtained 24 weeks after the first dose and 16 weeks after the second dose.  Zoster vaccine. One dose is recommended for adults aged 30 years or older unless certain conditions are present.  Measles, mumps, and rubella (MMR) vaccine. Adults born  before 1957 generally are considered immune to measles and mumps. Adults born in 1957 or later should have 1 or more doses of MMR vaccine unless there is a contraindication to the vaccine or there is laboratory evidence of immunity to each of the three diseases. A routine second dose of MMR vaccine should be obtained at least 28 days after the first dose for students attending postsecondary schools, health care workers, or international travelers. People who received inactivated measles vaccine or an unknown type of measles vaccine during 1963-1967 should receive 2 doses of MMR vaccine. People who received inactivated mumps vaccine or an unknown type of mumps vaccine before 1979 and are at high risk for mumps infection should consider immunization with 2 doses of MMR vaccine. For females of childbearing age, rubella immunity should be determined. If there is no evidence of immunity, females who are not pregnant should be vaccinated. If there is no evidence of immunity, females who are pregnant should delay immunization until after pregnancy. Unvaccinated health care workers born before 1957 who lack laboratory evidence of measles, mumps, or rubella immunity or laboratory confirmation of disease should consider measles and mumps immunization with 2 doses of MMR vaccine or rubella immunization with 1 dose of MMR vaccine.  Pneumococcal 13-valent conjugate (PCV13) vaccine. When indicated, a person who is uncertain of his immunization history and has no record of immunization should receive the PCV13 vaccine. All adults 65 years of age and older should receive this  vaccine. An adult aged 19 years or older who has certain medical conditions and has not been previously immunized should receive 1 dose of PCV13 vaccine. This PCV13 should be followed with a dose of pneumococcal polysaccharide (PPSV23) vaccine. Adults who are at high risk for pneumococcal disease should obtain the PPSV23 vaccine at least 8 weeks after the dose of PCV13 vaccine. Adults older than 52 years of age who have normal immune system function should obtain the PPSV23 vaccine dose at least 1 year after the dose of PCV13 vaccine.  Pneumococcal polysaccharide (PPSV23) vaccine. When PCV13 is also indicated, PCV13 should be obtained first. All adults aged 65 years and older should be immunized. An adult younger than age 65 years who has certain medical conditions should be immunized. Any person who resides in a nursing home or long-term care facility should be immunized. An adult smoker should be immunized. People with an immunocompromised condition and certain other conditions should receive both PCV13 and PPSV23 vaccines. People with human immunodeficiency virus (HIV) infection should be immunized as soon as possible after diagnosis. Immunization during chemotherapy or radiation therapy should be avoided. Routine use of PPSV23 vaccine is not recommended for American Indians, [place] Natives, or people younger than 65 years unless there are medical conditions that require PPSV23 vaccine. When indicated, people who have unknown immunization and have no record of immunization should receive PPSV23 vaccine. One-time revaccination 5 years after the first dose of PPSV23 is recommended for people aged 19-64 years who have chronic kidney failure, nephrotic syndrome, asplenia, or immunocompromised conditions. People who received 1-2 doses of PPSV23 before age 65 years should receive another dose of PPSV23 vaccine at age 65 years or later if at least 5 years have passed since the previous dose. Doses of PPSV23 are not  needed for people immunized with PPSV23 at or after age 65 years.  Meningococcal vaccine. Adults with asplenia or persistent complement component deficiencies should receive 2 doses of quadrivalent meningococcal conjugate (MenACWY-D) vaccine. The doses should be obtained   at least 2 months apart. Microbiologists working with certain meningococcal bacteria, [place] recruits, people at risk during an outbreak, and people who travel to or live in countries with a high rate of meningitis should be immunized. A first-year college student up through age 34 years who is living in a residence hall should receive a dose if she did not receive a dose on or after her 16th birthday. Adults who have certain high-risk conditions should receive one or more doses of vaccine.  Hepatitis A vaccine. Adults who wish to be protected from this disease, have certain high-risk conditions, work with hepatitis A-infected animals, work in hepatitis A research labs, or travel to or work in countries with a high rate of hepatitis A should be immunized. Adults who were previously unvaccinated and who anticipate close contact with an international adoptee during the first 60 days after arrival in the [place] from a country with a high rate of hepatitis A should be immunized.  Hepatitis B vaccine. Adults who wish to be protected from this disease, have certain high-risk conditions, may be exposed to blood or other infectious body fluids, are household contacts or sex partners of hepatitis B positive people, are clients or workers in certain care facilities, or travel to or work in countries with a high rate of hepatitis B should be immunized.  Haemophilus influenzae type b (Hib) vaccine. A previously unvaccinated person with asplenia or sickle cell disease or having a scheduled splenectomy should receive 1 dose of Hib vaccine. Regardless of previous immunization, a recipient of a hematopoietic stem cell transplant should receive a  3-dose series 6-12 months after her successful transplant. Hib vaccine is not recommended for adults with HIV infection. Preventive Services / Frequency Ages 35 to 4 years  Blood pressure check.** / Every 3-5 years.  Lipid and cholesterol check.** / Every 5 years beginning at age 60.  Clinical breast exam.** / Every 3 years for women in their 71s and 10s.  BRCA-related cancer risk assessment.** / For women who have family members with a BRCA-related cancer (breast, ovarian, tubal, or peritoneal cancers).  Pap test.** / Every 2 years from ages 76 through 26. Every 3 years starting at age 61 through age 76 or 93 with a history of 3 consecutive normal Pap tests.  HPV screening.** / Every 3 years from ages 37 through ages 60 to 51 with a history of 3 consecutive normal Pap tests.  Hepatitis C blood test.** / For any individual with known risks for hepatitis C.  Skin self-exam. / Monthly.  Influenza vaccine. / Every year.  Tetanus, diphtheria, and acellular pertussis (Tdap, Td) vaccine.** / Consult your health care provider. Pregnant women should receive 1 dose of Tdap vaccine during each pregnancy. 1 dose of Td every 10 years.  Varicella vaccine.** / Consult your health care provider. Pregnant females who do not have evidence of immunity should receive the first dose after pregnancy.  HPV vaccine. / 3 doses over 6 months, if 93 and younger. The vaccine is not recommended for use in pregnant females. However, pregnancy testing is not needed before receiving a dose.  Measles, mumps, rubella (MMR) vaccine.** / You need at least 1 dose of MMR if you were born in 1957 or later. You may also need a 2nd dose. For females of childbearing age, rubella immunity should be determined. If there is no evidence of immunity, females who are not pregnant should be vaccinated. If there is no evidence of immunity, females who are  pregnant should delay immunization until after pregnancy.  Pneumococcal  13-valent conjugate (PCV13) vaccine.** / Consult your health care provider.  Pneumococcal polysaccharide (PPSV23) vaccine.** / 1 to 2 doses if you smoke cigarettes or if you have certain conditions.  Meningococcal vaccine.** / 1 dose if you are age 68 to 8 years and a Market researcher living in a residence hall, or have one of several medical conditions, you need to get vaccinated against meningococcal disease. You may also need additional booster doses.  Hepatitis A vaccine.** / Consult your health care provider.  Hepatitis B vaccine.** / Consult your health care provider.  Haemophilus influenzae type b (Hib) vaccine.** / Consult your health care provider. Ages 7 to 53 years  Blood pressure check.** / Every year.  Lipid and cholesterol check.** / Every 5 years beginning at age 25 years.  Lung cancer screening. / Every year if you are aged 11-80 years and have a 30-pack-year history of smoking and currently smoke or have quit within the past 15 years. Yearly screening is stopped once you have quit smoking for at least 15 years or develop a health problem that would prevent you from having lung cancer treatment.  Clinical breast exam.** / Every year after age 48 years.  BRCA-related cancer risk assessment.** / For women who have family members with a BRCA-related cancer (breast, ovarian, tubal, or peritoneal cancers).  Mammogram.** / Every year beginning at age 41 years and continuing for as long as you are in good health. Consult with your health care provider.  Pap test.** / Every 3 years starting at age 65 years through age 37 or 70 years with a history of 3 consecutive normal Pap tests.  HPV screening.** / Every 3 years from ages 72 years through ages 60 to 40 years with a history of 3 consecutive normal Pap tests.  Fecal occult blood test (FOBT) of stool. / Every year beginning at age 21 years and continuing until age 5 years. You may not need to do this test if you get  a colonoscopy every 10 years.  Flexible sigmoidoscopy or colonoscopy.** / Every 5 years for a flexible sigmoidoscopy or every 10 years for a colonoscopy beginning at age 35 years and continuing until age 48 years.  Hepatitis C blood test.** / For all people born from [birth year] through 1965 and any individual with known risks for hepatitis C.  Skin self-exam. / Monthly.  Influenza vaccine. / Every year.  Tetanus, diphtheria, and acellular pertussis (Tdap/Td) vaccine.** / Consult your health care provider. Pregnant women should receive 1 dose of Tdap vaccine during each pregnancy. 1 dose of Td every 10 years.  Varicella vaccine.** / Consult your health care provider. Pregnant females who do not have evidence of immunity should receive the first dose after pregnancy.  Zoster vaccine.** / 1 dose for adults aged 30 years or older.  Measles, mumps, rubella (MMR) vaccine.** / You need at least 1 dose of MMR if you were born in 1957 or later. You may also need a second dose. For females of childbearing age, rubella immunity should be determined. If there is no evidence of immunity, females who are not pregnant should be vaccinated. If there is no evidence of immunity, females who are pregnant should delay immunization until after pregnancy.  Pneumococcal 13-valent conjugate (PCV13) vaccine.** / Consult your health care provider.  Pneumococcal polysaccharide (PPSV23) vaccine.** / 1 to 2 doses if you smoke cigarettes or if you have certain conditions.  Meningococcal vaccine.** /  Consult your health care provider.  Hepatitis A vaccine.** / Consult your health care provider.  Hepatitis B vaccine.** / Consult your health care provider.  Haemophilus influenzae type b (Hib) vaccine.** / Consult your health care provider. Ages 64 years and over  Blood pressure check.** / Every year.  Lipid and cholesterol check.** / Every 5 years beginning at age 23 years.  Lung cancer screening. / Every year if you  are aged 16-80 years and have a 30-pack-year history of smoking and currently smoke or have quit within the past 15 years. Yearly screening is stopped once you have quit smoking for at least 15 years or develop a health problem that would prevent you from having lung cancer treatment.  Clinical breast exam.** / Every year after age 74 years.  BRCA-related cancer risk assessment.** / For women who have family members with a BRCA-related cancer (breast, ovarian, tubal, or peritoneal cancers).  Mammogram.** / Every year beginning at age 44 years and continuing for as long as you are in good health. Consult with your health care provider.  Pap test.** / Every 3 years starting at age 58 years through age 22 or 39 years with 3 consecutive normal Pap tests. Testing can be stopped between 65 and 70 years with 3 consecutive normal Pap tests and no abnormal Pap or HPV tests in the past 10 years.  HPV screening.** / Every 3 years from ages 64 years through ages 70 or 61 years with a history of 3 consecutive normal Pap tests. Testing can be stopped between 65 and 70 years with 3 consecutive normal Pap tests and no abnormal Pap or HPV tests in the past 10 years.  Fecal occult blood test (FOBT) of stool. / Every year beginning at age 40 years and continuing until age 27 years. You may not need to do this test if you get a colonoscopy every 10 years.  Flexible sigmoidoscopy or colonoscopy.** / Every 5 years for a flexible sigmoidoscopy or every 10 years for a colonoscopy beginning at age 7 years and continuing until age 32 years.  Hepatitis C blood test.** / For all people born from [birth year] through 1963 and any individual with known risks for hepatitis C.  Osteoporosis screening.** / A one-time screening for women ages 30 years and over and women at risk for fractures or osteoporosis.  Skin self-exam. / Monthly.  Influenza vaccine. / Every year.  Tetanus, diphtheria, and acellular pertussis (Tdap/Td)  vaccine.** / 1 dose of Td every 10 years.  Varicella vaccine.** / Consult your health care provider.  Zoster vaccine.** / 1 dose for adults aged 35 years or older.  Pneumococcal 13-valent conjugate (PCV13) vaccine.** / Consult your health care provider.  Pneumococcal polysaccharide (PPSV23) vaccine.** / 1 dose for all adults aged 46 years and older.  Meningococcal vaccine.** / Consult your health care provider.  Hepatitis A vaccine.** / Consult your health care provider.  Hepatitis B vaccine.** / Consult your health care provider.  Haemophilus influenzae type b (Hib) vaccine.** / Consult your health care provider. ** Family history and personal history of risk and conditions may change your health care provider's recommendations.   This information is not intended to replace advice given to you by your health care provider. Make sure you discuss any questions you have with your health care provider.   Document Released: 05/17/2001 Document Revised: 04/11/2014 Document Reviewed: 08/16/2010 Elsevier Interactive Patient Education [employer].

## 2015-03-23 NOTE — Assessment & Plan Note (Signed)
Diflucan 150 mg

## 2015-03-23 NOTE — Progress Notes (Signed)
Subjective:     Sandy Green is a 54 y.o. female and is here for a comprehensive physical exam. The patient reports no problems.  Social History   Social History  . Marital Status: Married    Spouse Name: N/A  . Number of Children: N/A  . Years of Education: N/A   Occupational History  . Not on file.   Social History Main Topics  . Smoking status: Never Smoker   . Smokeless tobacco: Never Used  . Alcohol Use: No  . Drug Use: No  . Sexual Activity:    Partners: Male   Other Topics Concern  . Not on file   Social History Narrative   Exercise-- no    Married,lives with husband.   Unemployed.   Health Maintenance  Topic Date Due  . Hepatitis C Screening  06/20/1960  . PAP SMEAR  03/05/2015  . HIV Screening  06/16/2015 (Originally 05/22/1977)  . COLONOSCOPY  03/22/2016 (Originally 05/22/2012)  . MAMMOGRAM  04/13/2015  . INFLUENZA VACCINE  11/03/2015  . TETANUS/TDAP  02/27/2018    The following portions of the patient's history were reviewed and updated as appropriate:  She  has a past medical history of Asthma; Chicken pox; Heart murmur; Hypertension; Hyperlipemia; Ankle fracture, left; and Allergy. She  does not have any pertinent problems on file. She  has no past surgical history on file. Her family history includes Cancer in her maternal grandmother; Diabetes in her maternal grandmother; Heart disease in her father; Hypertension in her father and mother. She  reports that she has never smoked. She has never used smokeless tobacco. She reports that she does not drink alcohol or use illicit drugs. She has a current medication list which includes the following prescription(s): albuterol, atorvastatin, diphenhydramine, escitalopram, fluticasone, montelukast, norethindrone-ethinyl estradiol-iron, valsartan, and fluconazole. Current Outpatient Prescriptions on File Prior to Visit  Medication Sig Dispense Refill  . albuterol (PROVENTIL HFA;VENTOLIN HFA) 108 (90 BASE) MCG/ACT  inhaler Inhale 1-2 puffs into the lungs every 6 (six) hours as needed for wheezing or shortness of breath. 1 Inhaler 0  . atorvastatin (LIPITOR) 20 MG tablet Take 1 tablet (20 mg total) by mouth daily. 90 tablet 1  . diphenhydrAMINE (BENADRYL) 25 mg capsule Take 25 mg by mouth every 6 (six) hours as needed.    . escitalopram (LEXAPRO) 10 MG tablet TAKE 1 TABLET (10 MG TOTAL) BY MOUTH DAILY. 90 tablet 3  . fluticasone (FLOVENT HFA) 220 MCG/ACT inhaler Inhale 1 puff into the lungs 2 (two) times daily. 3 Inhaler 3  . montelukast (SINGULAIR) 10 MG tablet TAKE 1 TABLET BY MOUTH AT BEDTIME 90 tablet 3   No current facility-administered medications on file prior to visit.   She is allergic to penicillins and tetracyclines & related..  Review of Systems Review of Systems  Constitutional: Negative for activity change, appetite change and fatigue.  HENT: Negative for hearing loss, congestion, tinnitus and ear discharge.  dentist - due Eyes: Negative for visual disturbance (see optho q1y -).  Respiratory: Negative for cough, chest tightness and shortness of breath.   Cardiovascular: Negative for chest pain, palpitations and leg swelling.  Gastrointestinal: Negative for abdominal pain, diarrhea, constipation and abdominal distention.  Genitourinary: Negative for urgency, frequency, decreased urine volume and difficulty urinating.  Musculoskeletal: Negative for back pain, arthralgias and gait problem.  Skin: Negative for color change, pallor and rash.  Neurological: Negative for dizziness, light-headedness, numbness and headaches.  Hematological: Negative for adenopathy. Does not bruise/bleed easily.  Psychiatric/Behavioral:   Negative for suicidal ideas, confusion, sleep disturbance, self-injury, dysphoric mood, decreased concentration and agitation.       Objective:    BP 126/80 mmHg  Pulse 87  Temp(Src) 98.2 F (36.8 C) (Oral)  Ht 5' 6" (1.676 m)  Wt 193 lb 6.4 oz (87.726 kg)  BMI 31.23  kg/m2  SpO2 98% General appearance: alert, cooperative, appears stated age and no distress Head: Normocephalic, without obvious abnormality, atraumatic Eyes: conjunctivae/corneas clear. PERRL, EOM's intact. Fundi benign. Ears: normal TM's and external ear canals both ears Nose: Nares normal. Septum midline. Mucosa normal. No drainage or sinus tenderness. Throat: lips, mucosa, and tongue normal; teeth and gums normal Neck: no adenopathy, no carotid bruit, no JVD, supple, symmetrical, trachea midline and thyroid not enlarged, symmetric, no tenderness/mass/nodules Back: symmetric, no curvature. ROM normal. No CVA tenderness. Lungs: clear to auscultation bilaterally Breasts: normal appearance, no masses or tenderness Heart: regular rate and rhythm, S1, S2 normal, no murmur, click, rub or gallop Abdomen: soft, non-tender; bowel sounds normal; no masses,  no organomegaly Pelvic: cervix normal in appearance, external genitalia normal, no adnexal masses or tenderness, no cervical motion tenderness, rectovaginal septum normal, uterus normal size, shape, and consistency, vagina normal without discharge and pap done, rectal heme neg [place]artner stool---  Extremities: extremities normal, atraumatic, no cyanosis or edema Pulses: 2+ and symmetric Skin: Skin color, texture, turgor normal. No rashes or lesions Lymph nodes: Cervical, supraclavicular, and axillary nodes normal. Neurologic: Alert and oriented X 3, normal strength and tone. Normal symmetric reflexes. Normal coordination and gait Psych-- no depression, no anxiety      Assessment:    Healthy female exam.      Plan:    ghm utd-- colon ordered Check labs See After Visit Summary for Counseling Recommendations   1. Preventative health care   - TSH; Future - POCT urinalysis dipstick; Future - Lipid panel; Future - CBC with Differential/Platelet; Future - Comp Met (CMET); Future - Ambulatory referral to Gastroenterology  2. Need for  hepatitis C screening test   - Hepatitis C antibody; Future

## 2015-03-24 ENCOUNTER — Other Ambulatory Visit (INDEPENDENT_AMBULATORY_CARE_PROVIDER_SITE_OTHER): Payer: Managed Care, Other (non HMO)

## 2015-03-24 DIAGNOSIS — Z0000 Encounter for general adult medical examination without abnormal findings: Secondary | ICD-10-CM

## 2015-03-24 DIAGNOSIS — R8290 Unspecified abnormal findings in urine: Secondary | ICD-10-CM

## 2015-03-24 DIAGNOSIS — Z1159 Encounter for screening for other viral diseases: Secondary | ICD-10-CM

## 2015-03-24 LAB — COMPREHENSIVE METABOLIC PANEL
ALK PHOS: 54 U/L (ref 39–117)
ALT: 11 U/L (ref 0–35)
AST: 15 U/L (ref 0–37)
Albumin: 3.3 g/dL — ABNORMAL LOW (ref 3.5–5.2)
BUN: 20 mg/dL (ref 6–23)
CHLORIDE: 105 meq/L (ref 96–112)
CO2: 25 meq/L (ref 19–32)
Calcium: 8.5 mg/dL (ref 8.4–10.5)
Creatinine, Ser: 0.99 mg/dL (ref 0.40–1.20)
GFR: 62.4 mL/min (ref >60.00)
GLUCOSE: 90 mg/dL (ref 70–99)
POTASSIUM: 3.9 meq/L (ref 3.5–5.1)
SODIUM: 136 meq/L (ref 135–145)
Total Bilirubin: 0.9 mg/dL (ref 0.2–1.2)
Total Protein: 6.7 g/dL (ref 6.0–8.3)

## 2015-03-24 LAB — CBC WITH DIFFERENTIAL/PLATELET
BASOS PCT: 0.9 % (ref 0.0–3.0)
Basophils Absolute: 0.1 10*3/uL (ref 0.0–0.1)
EOS ABS: 0.3 10*3/uL (ref 0.0–0.7)
Eosinophils Relative: 4 % (ref 0.0–5.0)
HEMATOCRIT: 41.1 % (ref 36.0–46.0)
Hemoglobin: 13.4 g/dL (ref 12.0–15.0)
LYMPHS ABS: 3 10*3/uL (ref 0.7–4.0)
LYMPHS PCT: 37.6 % (ref 12.0–46.0)
MCHC: 32.5 g/dL (ref 30.0–36.0)
MCV: 90.7 fl (ref 78.0–100.0)
Monocytes Absolute: 0.5 10*3/uL (ref 0.1–1.0)
Monocytes Relative: 6.7 % (ref 3.0–12.0)
NEUTROS ABS: 4.1 10*3/uL (ref 1.4–7.7)
NEUTROS PCT: 50.8 % (ref 43.0–77.0)
PLATELETS: 318 10*3/uL (ref 150.0–400.0)
RBC: 4.54 Mil/uL (ref 3.87–5.11)
RDW: 13.5 % (ref 11.5–15.5)
WBC: 8 10*3/uL (ref 4.0–10.5)

## 2015-03-24 LAB — POCT URINALYSIS DIPSTICK
GLUCOSE UA: NEGATIVE
Ketones, UA: NEGATIVE
LEUKOCYTES UA: NEGATIVE
NITRITE UA: NEGATIVE
Spec Grav, UA: 1.02
UROBILINOGEN UA: NEGATIVE
pH, UA: 6

## 2015-03-24 LAB — LIPID PANEL
CHOLESTEROL: 185 mg/dL (ref 0–200)
HDL: 47 mg/dL (ref >39.00)
LDL CALC: 118 mg/dL — AB (ref 0–99)
NonHDL: 137.93
TRIGLYCERIDES: 101 mg/dL (ref 0.0–149.0)
Total CHOL/HDL Ratio: 4
VLDL: 20.2 mg/dL (ref 0.0–40.0)

## 2015-03-24 LAB — TSH: TSH: 2.79 u[IU]/mL (ref 0.35–4.50)

## 2015-03-25 LAB — CYTOLOGY - PAP

## 2015-03-25 LAB — URINE CULTURE
COLONY COUNT: NO GROWTH
ORGANISM ID, BACTERIA: NO GROWTH

## 2015-03-25 LAB — HEPATITIS C ANTIBODY: HCV Ab: NEGATIVE

## 2015-04-09 ENCOUNTER — Telehealth: Payer: Self-pay

## 2015-04-09 DIAGNOSIS — E785 Hyperlipidemia, unspecified: Secondary | ICD-10-CM

## 2015-04-09 MED ORDER — ATORVASTATIN CALCIUM 20 MG PO TABS: 20.0000 mg | ORAL_TABLET | Freq: Every day | ORAL | Status: DC

## 2015-04-09 NOTE — Telephone Encounter (Signed)
Patient requesting refill for atorvastatin (LIPITOR) 20 MG tablet once daily  Sent to [place], [place] patient has 7days worth on hand

## 2015-04-09 NOTE — Telephone Encounter (Signed)
Faxed.   KP 

## 2015-04-19 ENCOUNTER — Emergency Department (INDEPENDENT_AMBULATORY_CARE_PROVIDER_SITE_OTHER)
Admission: EM | Admit: 2015-04-19 | Discharge: 2015-04-19 | Disposition: A | Discharge status: Home / Self Care | Payer: Managed Care, Other (non HMO) | Source: Home / Self Care | Attending: Emergency Medicine | Admitting: Emergency Medicine

## 2015-04-19 ENCOUNTER — Encounter (HOSPITAL_COMMUNITY): Payer: Self-pay | Admitting: Emergency Medicine

## 2015-04-19 DIAGNOSIS — J018 Other acute sinusitis: Secondary | ICD-10-CM | POA: Diagnosis not present

## 2015-04-19 MED ORDER — PREDNISONE 50 MG PO TABS: ORAL_TABLET | ORAL | Status: DC

## 2015-04-19 NOTE — ED Notes (Signed)
C/o cold sx onset Friday associated w/congestion, runny nose, facial pressure and dizziness A&O x4... No acute distress.

## 2015-04-19 NOTE — ED Provider Notes (Signed)
CSN: [id number]     Arrival date & time 04/19/15  1259 History   First MD Initiated Contact with Patient 04/19/15 1319     Chief Complaint  Patient presents with  . URI   (Consider location/radiation/quality/duration/timing/severity/associated sxs/prior Treatment) HPI  She is a 55 year old woman here for evaluation of sinus pressure. She states she started developing some nasal congestion 2 days ago. She also reports gradually worsening facial pressure. She states the facial pain and pressure is primarily over the maxillary sinuses and radiates into the right upper jaw. This morning, she reports feeling dizzy with sudden movements. She describes the dizziness as feeling as if she is on a boat. It comes on with position changes and gradually resolves over a few minutes. No syncope. She denies any fevers. She describes some intermittent postnasal drainage. Denies cough. No wheezing or shortness of breath. She has tried taking Tylenol with some improvement.  Past Medical History  Diagnosis Date  . Asthma   . Chicken pox   . Heart murmur   . Hypertension   . Hyperlipemia   . Ankle fracture, left   . Allergy    History reviewed. No pertinent past surgical history. Family History  Problem Relation Age of Onset  . Diabetes Maternal Grandmother   . Cancer Maternal Grandmother     Unsure of kind? Breast  . Hypertension Mother   . Hypertension Father   . Heart disease Father     cabg.[id number]   Social History  Substance Use Topics  . Smoking status: Never Smoker   . Smokeless tobacco: Never Used  . Alcohol Use: No   OB History    No data available     Review of Systems As in history of present illness Allergies  Penicillins and Tetracyclines & related  Home Medications   Prior to Admission medications   Medication Sig Start Date End Date Taking? Authorizing Provider  atorvastatin (LIPITOR) 20 MG tablet Take 1 tablet (20 mg total) by mouth daily. 04/09/15  Yes Yvonne R Lowne, DO   escitalopram (LEXAPRO) 10 MG tablet TAKE 1 TABLET (10 MG TOTAL) BY MOUTH DAILY. 06/16/14  Yes Yvonne R Lowne, DO  montelukast (SINGULAIR) 10 MG tablet TAKE 1 TABLET BY MOUTH AT BEDTIME 06/16/14  Yes Yvonne R Lowne, DO  valsartan (DIOVAN) 160 MG tablet Take 1 tablet (160 mg total) by mouth daily. 03/23/15  Yes Yvonne R Lowne, DO  albuterol (PROVENTIL HFA;VENTOLIN HFA) 108 (90 BASE) MCG/ACT inhaler Inhale 1-2 puffs into the lungs every 6 (six) hours as needed for wheezing or shortness of breath. 05/03/14   Harden Mo, MD  diphenhydrAMINE (BENADRYL) 25 mg capsule Take 25 mg by mouth every 6 (six) hours as needed.    Historical Provider, MD  fluconazole (DIFLUCAN) 150 MG tablet Take 1 tablet (150 mg total) by mouth once. 03/23/15   Alferd Apa Lowne, DO  fluticasone (FLOVENT HFA) 220 MCG/ACT inhaler Inhale 1 puff into the lungs 2 (two) times daily. 09/15/14   Rosalita Chessman, DO  norethindrone-ethinyl estradiol-iron (MICROGESTIN FE,GILDESS FE,LOESTRIN FE) 1.5-30 MG-MCG tablet Take 1 tablet by mouth daily. 03/23/15   Rosalita Chessman, DO  predniSONE (DELTASONE) 50 MG tablet Take 1 pill daily for 5 days. 04/19/15   Melony Overly, MD   Meds Ordered and Administered this Visit  Medications - No data to display  BP 167/100 mmHg  Pulse 96  Temp(Src) 97.7 F (36.5 C) (Oral)  SpO2 98% No data found.   Physical  Exam  Constitutional: She is oriented to person, place, and time. She appears well-developed and well-nourished. No distress.  HENT:  Mouth/Throat: Oropharynx is clear and moist. No oropharyngeal exudate.  Nasal mucosa is erythematous and boggy. She is tender over the maxillary sinuses. Left TM is retracted. Right TM is normal.  Neck: Neck supple.  Cardiovascular: Normal rate and normal heart sounds.   No murmur heard. Pulmonary/Chest: Effort normal and breath sounds normal. No respiratory distress. She has no wheezes. She has no rales.  Lymphadenopathy:    She has no cervical adenopathy.   Neurological: She is alert and oriented to person, place, and time.    ED Course  Procedures (including critical care time)  Labs Review Labs Reviewed - No data to display  Imaging Review No results found.    MDM   1. Other acute sinusitis    Treatment with 5 days of prednisone. Also recommended OTC allergy medication and nasal saline spray. She is concerned about her foot pressure. She states she saw her primary care doctor about 3 weeks ago and it was well controlled at that time. I suggested rechecking it at the drug store in a few days if it remains elevated, following up with her PCP. Expect improvement over the next week. Follow-up as needed.    Melony Overly, MD 04/19/15 1344

## 2015-04-19 NOTE — Discharge Instructions (Signed)
You have sinusitis. This is likely caused by a virus. Symptoms typically peak around day 3-4 then gradually improve over the next 3-4 days. Take prednisone daily for the next 5 days to help with the congestion and swelling of the sinuses. Continue nasal saline spray as tolerated. I also recommend taking an over-the-counter allergy medicine such as Zyrtec or Allegra. Try not to worry too much about your blood pressure. Give it a few days, and recheck it at the drug store. If it remains elevated, please call your doctor. Follow-up if your symptoms are not improving in 7-10 days.

## 2015-05-13 ENCOUNTER — Other Ambulatory Visit: Payer: Self-pay | Admitting: Family Medicine

## 2015-05-13 DIAGNOSIS — Z1231 Encounter for screening mammogram for malignant neoplasm of breast: Secondary | ICD-10-CM

## 2015-05-28 ENCOUNTER — Ambulatory Visit
Admission: RE | Admit: 2015-05-28 | Discharge: 2015-05-28 | Disposition: A | Discharge status: Home / Self Care | Payer: Managed Care, Other (non HMO) | Source: Ambulatory Visit | Attending: Family Medicine | Admitting: Family Medicine

## 2015-05-28 DIAGNOSIS — Z1231 Encounter for screening mammogram for malignant neoplasm of breast: Secondary | ICD-10-CM

## 2015-06-22 ENCOUNTER — Encounter: Payer: Self-pay | Admitting: Family Medicine

## 2015-06-22 MED ORDER — BECLOMETHASONE DIPROPIONATE 80 MCG/ACT IN AERS: 2.0000 | INHALATION_SPRAY | Freq: Two times a day (BID) | RESPIRATORY_TRACT | Status: DC | PRN

## 2015-06-22 NOTE — Telephone Encounter (Signed)
Ok to refill qvar #3  2 refills 2 puffs qid prn

## 2015-09-02 ENCOUNTER — Encounter: Payer: Self-pay | Admitting: Family Medicine

## 2015-09-02 DIAGNOSIS — E785 Hyperlipidemia, unspecified: Secondary | ICD-10-CM

## 2015-09-02 DIAGNOSIS — J45909 Unspecified asthma, uncomplicated: Secondary | ICD-10-CM

## 2015-09-02 DIAGNOSIS — F419 Anxiety disorder, unspecified: Secondary | ICD-10-CM

## 2015-09-02 MED ORDER — ESCITALOPRAM OXALATE 10 MG PO TABS: ORAL_TABLET | ORAL | Status: DC

## 2015-09-02 MED ORDER — ATORVASTATIN CALCIUM 20 MG PO TABS: 20.0000 mg | ORAL_TABLET | Freq: Every day | ORAL | Status: DC

## 2015-09-02 MED ORDER — MONTELUKAST SODIUM 10 MG PO TABS: ORAL_TABLET | ORAL | Status: DC

## 2015-09-02 MED ORDER — VALSARTAN 160 MG PO TABS: 160.0000 mg | ORAL_TABLET | Freq: Every day | ORAL | Status: DC

## 2015-09-02 NOTE — Telephone Encounter (Signed)
Rx's sent to the pharmacy by e-script.//AB/CMA 

## 2015-09-21 ENCOUNTER — Ambulatory Visit (INDEPENDENT_AMBULATORY_CARE_PROVIDER_SITE_OTHER): Payer: Managed Care, Other (non HMO) | Admitting: Family Medicine

## 2015-09-21 ENCOUNTER — Encounter: Payer: Self-pay | Admitting: Family Medicine

## 2015-09-21 VITALS — BP 138/82 | HR 82 | Temp 98.6°F | Ht 66.0 in | Wt 195.4 lb

## 2015-09-21 DIAGNOSIS — F419 Anxiety disorder, unspecified: Secondary | ICD-10-CM

## 2015-09-21 DIAGNOSIS — E785 Hyperlipidemia, unspecified: Secondary | ICD-10-CM

## 2015-09-21 DIAGNOSIS — R319 Hematuria, unspecified: Secondary | ICD-10-CM

## 2015-09-21 DIAGNOSIS — I10 Essential (primary) hypertension: Secondary | ICD-10-CM

## 2015-09-21 DIAGNOSIS — R8290 Unspecified abnormal findings in urine: Secondary | ICD-10-CM

## 2015-09-21 DIAGNOSIS — M25532 Pain in left wrist: Secondary | ICD-10-CM

## 2015-09-21 DIAGNOSIS — M79672 Pain in left foot: Secondary | ICD-10-CM

## 2015-09-21 DIAGNOSIS — N390 Urinary tract infection, site not specified: Secondary | ICD-10-CM

## 2015-09-21 DIAGNOSIS — R82998 Other abnormal findings in urine: Secondary | ICD-10-CM

## 2015-09-21 LAB — POCT URINALYSIS DIPSTICK
BILIRUBIN UA: NEGATIVE
GLUCOSE UA: NEGATIVE
KETONES UA: NEGATIVE
NITRITE UA: NEGATIVE
PH UA: 5.5
Protein, UA: NEGATIVE
Spec Grav, UA: >=1.03
Urobilinogen, UA: 0.2

## 2015-09-21 LAB — LIPID PANEL
CHOLESTEROL: 165 mg/dL (ref 0–200)
HDL: 49.3 mg/dL (ref >39.00)
LDL Cholesterol: 98 mg/dL (ref 0–99)
NonHDL: 115.23
TRIGLYCERIDES: 85 mg/dL (ref 0.0–149.0)
Total CHOL/HDL Ratio: 3
VLDL: 17 mg/dL (ref 0.0–40.0)

## 2015-09-21 LAB — COMPREHENSIVE METABOLIC PANEL
ALBUMIN: 3.4 g/dL — AB (ref 3.5–5.2)
ALK PHOS: 62 U/L (ref 39–117)
ALT: 13 U/L (ref 0–35)
AST: 16 U/L (ref 0–37)
BILIRUBIN TOTAL: 0.9 mg/dL (ref 0.2–1.2)
BUN: 14 mg/dL (ref 6–23)
CALCIUM: 8.7 mg/dL (ref 8.4–10.5)
CO2: 24 mEq/L (ref 19–32)
Chloride: 107 mEq/L (ref 96–112)
Creatinine, Ser: 1.01 mg/dL (ref 0.40–1.20)
GFR: 60.86 mL/min (ref >60.00)
GLUCOSE: 88 mg/dL (ref 70–99)
POTASSIUM: 4.5 meq/L (ref 3.5–5.1)
Sodium: 139 mEq/L (ref 135–145)
TOTAL PROTEIN: 6.9 g/dL (ref 6.0–8.3)

## 2015-09-21 MED ORDER — ESCITALOPRAM OXALATE 10 MG PO TABS
ORAL_TABLET | ORAL | Status: DC
Start: 2015-09-21 — End: 2015-11-18

## 2015-09-21 MED ORDER — VALSARTAN 160 MG PO TABS: 160.0000 mg | ORAL_TABLET | Freq: Every day | ORAL | Status: DC

## 2015-09-21 MED ORDER — ATORVASTATIN CALCIUM 20 MG PO TABS: 20.0000 mg | ORAL_TABLET | Freq: Every day | ORAL | Status: DC

## 2015-09-21 NOTE — Assessment & Plan Note (Signed)
?   Strain/ sprain Splint F/u with ortho

## 2015-09-21 NOTE — Progress Notes (Signed)
Patient ID: Sandy Green, female    DOB: September 28, 1960  Age: 55 y.o. MRN: [id number]    Subjective:  Subjective HPI Sandy Green presents for f/u bp and cholesterol. No cp, sob.  She is c/o L heel pain that is worse first thing in am and when she sits and gets back.   She is icing it.,  Stretching it and using gel inserts and Nsaids with no relief.   Symptoms x 3-4 months. Pt also c/o L wrist pain after planting a tree.  She felt a pop and it hurts with certain movements.    Review of Systems  Constitutional: Negative for diaphoresis, appetite change, fatigue and unexpected weight change.  Eyes: Negative for pain, redness and visual disturbance.  Respiratory: Negative for cough, chest tightness, shortness of breath and wheezing.   Cardiovascular: Negative for chest pain, palpitations and leg swelling.  Endocrine: Negative for cold intolerance, heat intolerance, polydipsia, polyphagia and polyuria.  Genitourinary: Negative for dysuria, frequency and difficulty urinating.  Musculoskeletal: Positive for joint swelling.       Pain in L wrist and L heel  Neurological: Negative for dizziness, light-headedness, numbness and headaches.    History Past Medical History  Diagnosis Date  . Asthma   . Chicken pox   . Heart murmur   . Hypertension   . Hyperlipemia   . Ankle fracture, left   . Allergy     She has no past surgical history on file.   Her family history includes Cancer in her maternal grandmother; Diabetes in her maternal grandmother; Heart disease in her father; Hypertension in her father and mother.She reports that she has never smoked. She has never used smokeless tobacco. She reports that she does not drink alcohol or use illicit drugs.  Current Outpatient Prescriptions on File Prior to Visit  Medication Sig Dispense Refill  . albuterol (PROVENTIL HFA;VENTOLIN HFA) 108 (90 BASE) MCG/ACT inhaler Inhale 1-2 puffs into the lungs every 6 (six) hours as needed for wheezing or shortness  of breath. 1 Inhaler 0  . beclomethasone (QVAR) 80 MCG/ACT inhaler Inhale 2 puffs into the lungs 2 (two) times daily as needed. 3 Inhaler 2  . montelukast (SINGULAIR) 10 MG tablet TAKE 1 TABLET BY MOUTH AT BEDTIME 90 tablet 0  . norethindrone-ethinyl estradiol-iron (MICROGESTIN FE,GILDESS FE,LOESTRIN FE) 1.5-30 MG-MCG tablet Take 1 tablet by mouth daily. 3 Package 4   No current facility-administered medications on file prior to visit.     Objective:  Objective Physical Exam  Constitutional: She is oriented to person, place, and time. She appears well-developed and well-nourished.  HENT:  Head: Normocephalic and atraumatic.  Eyes: Conjunctivae and EOM are normal.  Neck: Normal range of motion. Neck supple. No JVD present. Carotid bruit is not present. No thyromegaly present.  Cardiovascular: Normal rate, regular rhythm and normal heart sounds.   No murmur heard. Pulmonary/Chest: Effort normal and breath sounds normal. No respiratory distress. She has no wheezes. She has no rales. She exhibits no tenderness.  Musculoskeletal: She exhibits no edema.       Left wrist: She exhibits normal range of motion, no tenderness, no bony tenderness, no swelling, no effusion and no crepitus.       Arms: Neurological: She is alert and oriented to person, place, and time.  Psychiatric: She has a normal mood and affect. Her behavior is normal. Thought content normal.  Nursing note and vitals reviewed.  BP 138/82 mmHg  Pulse 82  Temp(Src) 98.6 F (37 C) (Oral)  Ht 5\' 6"  (1.676 m)  Wt 195 lb 6.4 oz (88.633 kg)  BMI 31.55 kg/m2  SpO2 98% Wt Readings from Last 3 Encounters:  09/21/15 195 lb 6.4 oz (88.633 kg)  03/23/15 193 lb 6.4 oz (87.726 kg)  01/01/15 186 lb 12.8 oz (84.732 kg)     Lab Results  Component Value Date   WBC 8.0 03/24/2015   HGB 13.4 03/24/2015   HCT 41.1 03/24/2015   PLT 318.0 03/24/2015   GLUCOSE 90 03/24/2015   CHOL 185 03/24/2015   TRIG 101.0 03/24/2015   HDL 47.00  03/24/2015   LDLCALC 118* 03/24/2015   ALT 11 03/24/2015   AST 15 03/24/2015   NA 136 03/24/2015   K 3.9 03/24/2015   CL 105 03/24/2015   CREATININE 0.99 03/24/2015   BUN 20 03/24/2015   CO2 25 03/24/2015   TSH 2.79 03/24/2015    Mm Digital Screening Bilateral  05/29/2015  CLINICAL DATA:  Screening. EXAM: DIGITAL SCREENING BILATERAL MAMMOGRAM WITH CAD COMPARISON:  Previous exam(s). ACR Breast Density Category c: The breast tissue is heterogeneously dense, which may obscure small masses. FINDINGS: There are no findings suspicious for malignancy. Images were processed with CAD. IMPRESSION: No mammographic evidence of malignancy. A result letter of this screening mammogram will be mailed directly to the patient. RECOMMENDATION: Screening mammogram in one year. (Code:SM-B-01Y) BI-RADS CATEGORY  1: Negative. Electronically Signed   By: Everlean Alstrom M.D.   On: 05/29/2015 07:59     Assessment & Plan:  Plan I have discontinued Sandy Green's fluticasone, diphenhydrAMINE, fluconazole, and predniSONE. I am also having her maintain her albuterol, norethindrone-ethinyl estradiol-iron, beclomethasone, montelukast, loratadine, valsartan, escitalopram, and atorvastatin.  Meds ordered this encounter  Medications  . loratadine (CLARITIN) 10 MG tablet    Sig: Take 10 mg by mouth daily.  . valsartan (DIOVAN) 160 MG tablet    Sig: Take 1 tablet (160 mg total) by mouth daily.    Dispense:  90 tablet    Refill:  0  . escitalopram (LEXAPRO) 10 MG tablet    Sig: TAKE 1 TABLET (10 MG TOTAL) BY MOUTH DAILY.    Dispense:  90 tablet    Refill:  0  . atorvastatin (LIPITOR) 20 MG tablet    Sig: Take 1 tablet (20 mg total) by mouth daily.    Dispense:  90 tablet    Refill:  0    Problem List Items Addressed This Visit    HTN (hypertension) - Primary   Relevant Medications   valsartan (DIOVAN) 160 MG tablet   atorvastatin (LIPITOR) 20 MG tablet   Other Relevant Orders   Comprehensive metabolic panel    Lipid panel   POCT urinalysis dipstick   Hyperlipidemia   Relevant Medications   valsartan (DIOVAN) 160 MG tablet   atorvastatin (LIPITOR) 20 MG tablet   Other Relevant Orders   Comprehensive metabolic panel   Lipid panel   POCT urinalysis dipstick   Left wrist pain    ? Strain/ sprain Splint F/u with ortho      Pain of left heel    ? Plantar fascitis Pt will f/u ortho She is already using gel inserts , ice and nsaids       Other Visit Diagnoses    Anxiety        Relevant Medications    escitalopram (LEXAPRO) 10 MG tablet    Hyperlipemia        Relevant Medications    valsartan (DIOVAN) 160 MG tablet  atorvastatin (LIPITOR) 20 MG tablet       Follow-up: Return in about 6 months (around 03/22/2016), or if symptoms worsen or fail to improve, for annual exam, fasting.  Ann Held, DO

## 2015-09-21 NOTE — Patient Instructions (Addendum)
Hypertension Hypertension, commonly called high blood pressure, is when the force of blood pumping through your arteries is too strong. Your arteries are the blood vessels that carry blood from your heart throughout your body. A blood pressure reading consists of a higher number over a lower number, such as 110/72. The higher number (systolic) is the pressure inside your arteries when your heart pumps. The lower number (diastolic) is the pressure inside your arteries when your heart relaxes. Ideally you want your blood pressure below 120/80. Hypertension forces your heart to work harder to pump blood. Your arteries may become narrow or stiff. Having untreated or uncontrolled hypertension can cause heart attack, stroke, kidney disease, and other problems. RISK FACTORS Some risk factors for high blood pressure are controllable. Others are not.  Risk factors you cannot control include:   Race. You may be at higher risk if you are African American.  Age. Risk increases with age.  Gender. Men are at higher risk than women before age 45 years. After age 65, women are at higher risk than men. Risk factors you can control include:  Not getting enough exercise or physical activity.  Being overweight.  Getting too much fat, sugar, calories, or salt in your diet.  Drinking too much alcohol. SIGNS AND SYMPTOMS Hypertension does not usually cause signs or symptoms. Extremely high blood pressure (hypertensive crisis) may cause headache, anxiety, shortness of breath, and nosebleed. DIAGNOSIS To check if you have hypertension, your health care provider will measure your blood pressure while you are seated, with your arm held at the level of your heart. It should be measured at least twice using the same arm. Certain conditions can cause a difference in blood pressure between your right and left arms. A blood pressure reading that is higher than normal on one occasion does not mean that you need treatment. If  it is not clear whether you have high blood pressure, you may be asked to return on a different day to have your blood pressure checked again. Or, you may be asked to monitor your blood pressure at home for 1 or more weeks. TREATMENT Treating high blood pressure includes making lifestyle changes and possibly taking medicine. Living a healthy lifestyle can help lower high blood pressure. You may need to change some of your habits. Lifestyle changes may include:  Following the DASH diet. This diet is high in fruits, vegetables, and whole grains. It is low in salt, red meat, and added sugars.  Keep your sodium intake below 2,300 mg per day.  Getting at least 30-45 minutes of aerobic exercise at least 4 times per week.  Losing weight if necessary.  Not smoking.  Limiting alcoholic beverages.  Learning ways to reduce stress. Your health care provider may prescribe medicine if lifestyle changes are not enough to get your blood pressure under control, and if one of the following is true:  You are 18-59 years of age and your systolic blood pressure is above 140.  You are 60 years of age or older, and your systolic blood pressure is above 150.  Your diastolic blood pressure is above 90.  You have diabetes, and your systolic blood pressure is over 140 or your diastolic blood pressure is over 90.  You have kidney disease and your blood pressure is above 140/90.  You have heart disease and your blood pressure is above 140/90. Your personal target blood pressure may vary depending on your medical conditions, your age, and other factors. HOME CARE INSTRUCTIONS    Have your blood pressure rechecked as directed by your health care provider.   Take medicines only as directed by your health care provider. Follow the directions carefully. Blood pressure medicines must be taken as prescribed. The medicine does not work as well when you skip doses. Skipping doses also puts you at risk for  problems.  Do not smoke.   Monitor your blood pressure at home as directed by your health care provider. SEEK MEDICAL CARE IF:   You think you are having a reaction to medicines taken.  You have recurrent headaches or feel dizzy.  You have swelling in your ankles.  You have trouble with your vision. SEEK IMMEDIATE MEDICAL CARE IF:  You develop a severe headache or confusion.  You have unusual weakness, numbness, or feel faint.  You have severe chest or abdominal pain.  You vomit repeatedly.  You have trouble breathing. MAKE SURE YOU:   Understand these instructions.  Will watch your condition.  Will get help right away if you are not doing well or get worse.   This information is not intended to replace advice given to you by your health care provider. Make sure you discuss any questions you have with your health care provider.   Document Released: 03/21/2005 Document Revised: 08/05/2014 Document Reviewed: 01/11/2013 Elsevier Interactive Patient Education 2016 Elsevier Inc.  

## 2015-09-21 NOTE — Assessment & Plan Note (Signed)
Stable con't meds 

## 2015-09-21 NOTE — Assessment & Plan Note (Signed)
?   Plantar fascitis Pt will f/u ortho She is already using gel inserts , ice and nsaids

## 2015-09-21 NOTE — Addendum Note (Signed)
Addended by: Harl Bowie on: 09/21/2015 10:04 AM   Modules accepted: Miquel Dunn

## 2015-09-21 NOTE — Progress Notes (Signed)
Pre visit review using our clinic review tool, if applicable. No additional management support is needed unless otherwise documented below in the visit note. 

## 2015-09-21 NOTE — Addendum Note (Signed)
Addended by: Harl Bowie on: 09/21/2015 10:11 AM   Modules accepted: Orders, SmartSet

## 2015-09-22 LAB — URINE CULTURE
Colony Count: NO GROWTH
Organism ID, Bacteria: NO GROWTH

## 2015-11-18 ENCOUNTER — Other Ambulatory Visit: Payer: Self-pay | Admitting: Family Medicine

## 2015-11-18 DIAGNOSIS — F419 Anxiety disorder, unspecified: Secondary | ICD-10-CM

## 2015-11-18 DIAGNOSIS — J45909 Unspecified asthma, uncomplicated: Secondary | ICD-10-CM

## 2015-11-18 DIAGNOSIS — I10 Essential (primary) hypertension: Secondary | ICD-10-CM

## 2015-11-19 MED ORDER — MONTELUKAST SODIUM 10 MG PO TABS: ORAL_TABLET | ORAL | 1 refills | Status: DC

## 2015-11-19 MED ORDER — VALSARTAN 160 MG PO TABS: 160.0000 mg | ORAL_TABLET | Freq: Every day | ORAL | 1 refills | Status: DC

## 2015-11-19 MED ORDER — ESCITALOPRAM OXALATE 10 MG PO TABS: ORAL_TABLET | ORAL | 1 refills | Status: DC

## 2015-12-14 ENCOUNTER — Other Ambulatory Visit: Payer: Self-pay | Admitting: Family Medicine

## 2015-12-14 DIAGNOSIS — E785 Hyperlipidemia, unspecified: Secondary | ICD-10-CM

## 2015-12-15 ENCOUNTER — Emergency Department (HOSPITAL_COMMUNITY)
Admission: EM | Admit: 2015-12-15 | Discharge: 2015-12-16 | Disposition: A | Discharge status: Home / Self Care | Payer: Managed Care, Other (non HMO) | Attending: Emergency Medicine | Admitting: Emergency Medicine

## 2015-12-15 ENCOUNTER — Encounter (HOSPITAL_COMMUNITY): Payer: Self-pay

## 2015-12-15 DIAGNOSIS — J0100 Acute maxillary sinusitis, unspecified: Secondary | ICD-10-CM | POA: Diagnosis not present

## 2015-12-15 DIAGNOSIS — Z79899 Other long term (current) drug therapy: Secondary | ICD-10-CM | POA: Insufficient documentation

## 2015-12-15 DIAGNOSIS — R0981 Nasal congestion: Secondary | ICD-10-CM | POA: Diagnosis present

## 2015-12-15 DIAGNOSIS — J45909 Unspecified asthma, uncomplicated: Secondary | ICD-10-CM | POA: Insufficient documentation

## 2015-12-15 DIAGNOSIS — I10 Essential (primary) hypertension: Secondary | ICD-10-CM | POA: Diagnosis not present

## 2015-12-15 MED ORDER — ATORVASTATIN CALCIUM 20 MG PO TABS: 20.0000 mg | ORAL_TABLET | Freq: Every day | ORAL | 1 refills | Status: DC

## 2015-12-15 NOTE — ED Triage Notes (Signed)
Pt complaining of nasal congestion and ear pain. Pt states has drainage and cough today. Pt denies any nausea/vomiting or fevers.

## 2015-12-16 MED ORDER — AZITHROMYCIN 250 MG PO TABS: 250.0000 mg | ORAL_TABLET | Freq: Every day | ORAL | 0 refills | Status: DC

## 2015-12-16 MED ORDER — AZITHROMYCIN 500 MG PO TABS: 500.0000 mg | ORAL_TABLET | Freq: Every day | ORAL | 0 refills | Status: DC

## 2015-12-16 NOTE — ED Notes (Signed)
Pt discharged during downtime

## 2015-12-16 NOTE — ED Provider Notes (Signed)
[facility] DEPT Provider Note   CSN: [id number] Arrival date & time: 12/15/15  2329     History   Chief Complaint Chief Complaint  Patient presents with  . Nasal Congestion    HPI Sandy Green is a 55 y.o. female.  HPI   55 year old female presents today with nasal congestion and sore throat. Patient reports that symptoms started on Sunday approximately 3 days ago with upper respiratory congestion. She reports symptoms have worsened over the right cheek, and right year. She reports sore throat improved today but continues with upper respiratory complaints. She reports dry cough, nonproductive, afebrile. No chest pain or shortness of breath. Patient has history of asthma allergies and sinus infections.  Past Medical History:  Diagnosis Date  . Allergy   . Ankle fracture, left   . Asthma   . Chicken pox   . Heart murmur   . Hyperlipemia   . Hypertension     Patient Active Problem List   Diagnosis Date Noted  . Left wrist pain 09/21/2015  . Pain of left heel 09/21/2015  . Vaginitis and vulvovaginitis 03/23/2015  . Rash and nonspecific skin eruption 01/01/2015  . HTN (hypertension) 06/16/2014  . Conjunctivitis 04/09/2014  . Hyperlipidemia 02/28/2013  . Multiple allergies 02/28/2013  . Asthma, mild intermittent 02/28/2013  . Obesity (BMI 30-39.9) 02/27/2013    History reviewed. No pertinent surgical history.  OB History    No data available       Home Medications    Prior to Admission medications   Medication Sig Start Date End Date Taking? Authorizing Provider  albuterol (PROVENTIL HFA;VENTOLIN HFA) 108 (90 BASE) MCG/ACT inhaler Inhale 1-2 puffs into the lungs every 6 (six) hours as needed for wheezing or shortness of breath. 05/03/14   Harden Mo, MD  atorvastatin (LIPITOR) 20 MG tablet Take 1 tablet (20 mg total) by mouth daily. 12/15/15   Alferd Apa Lowne Chase, DO  azithromycin (ZITHROMAX) 500 MG tablet Take 1 tablet (500 mg total) by mouth daily.  12/16/15   Okey Regal, PA-C  beclomethasone (QVAR) 80 MCG/ACT inhaler Inhale 2 puffs into the lungs 2 (two) times daily as needed. 06/22/15   Alferd Apa Lowne Chase, DO  escitalopram (LEXAPRO) 10 MG tablet TAKE 1 TABLET (10 MG TOTAL) BY MOUTH DAILY. 11/19/15   Rosalita Chessman Chase, DO  loratadine (CLARITIN) 10 MG tablet Take 10 mg by mouth daily.    Historical Provider, MD  montelukast (SINGULAIR) 10 MG tablet TAKE 1 TABLET BY MOUTH AT BEDTIME 11/19/15   Rosalita Chessman Chase, DO  norethindrone-ethinyl estradiol-iron (MICROGESTIN FE,GILDESS FE,LOESTRIN FE) 1.5-30 MG-MCG tablet Take 1 tablet by mouth daily. 03/23/15   Alferd Apa Lowne Chase, DO  valsartan (DIOVAN) 160 MG tablet Take 1 tablet (160 mg total) by mouth daily. 11/19/15   Ann Held, DO    Family History Family History  Problem Relation Age of Onset  . Hypertension Mother   . Hypertension Father   . Heart disease Father     cabg.6  . Diabetes Maternal Grandmother   . Cancer Maternal Grandmother     Unsure of kind? Breast    Social History Social History  Substance Use Topics  . Smoking status: Never Smoker  . Smokeless tobacco: Never Used  . Alcohol use No     Allergies   Penicillins and Tetracyclines & related   Review of Systems Review of Systems  All other systems reviewed and are negative.    Physical Exam  Updated Vital Signs BP 159/96 (BP Location: Right Arm)   Pulse 92   Temp 98.3 F (36.8 C) (Oral)   Resp 22   Ht 5\' 6"  (1.676 m)   Wt 86.2 kg   SpO2 99%   BMI 30.67 kg/m   Physical Exam  Constitutional: She is oriented to person, place, and time. She appears well-developed and well-nourished.  HENT:  Head: Normocephalic and atraumatic.  Tenderness to palpation other right maxillary sinus, TMs normal bilateral, no swelling or edema of the throat, uvula midline, no exudate. Neck supple full active range of motion, rhinorrhea noted on exam  Eyes: Conjunctivae are normal. Pupils are equal,  round, and reactive to light. Right eye exhibits no discharge. Left eye exhibits no discharge. No scleral icterus.  Neck: Normal range of motion. No JVD present. No tracheal deviation present.  Pulmonary/Chest: Effort normal. No stridor.  Neurological: She is alert and oriented to person, place, and time. Coordination normal.  Psychiatric: She has a normal mood and affect. Her behavior is normal. Judgment and thought content normal.  Nursing note and vitals reviewed.    ED Treatments / Results  Labs (all labs ordered are listed, but only abnormal results are displayed) Labs Reviewed - No data to display  EKG  EKG Interpretation None       Radiology No results found.  Procedures Procedures (including critical care time)  Medications Ordered in ED Medications - No data to display   Initial Impression / Assessment and Plan / ED Course  I have reviewed the triage vital signs and the nursing notes.  Pertinent labs & imaging results that were available during my care of the patient were reviewed by me and considered in my medical decision making (see chart for details).  Clinical Course    Labs:  Imaging:  Consults:  Therapeutics:  Discharge Meds:   Assessment/Plan:  56 year old female presents today with likely sinusitis. Patient has right-sided sinus pain, severe in nature. She is afebrile nontoxic. Patient was counseled on viral versus bacterial infections, she is instructed to hold antibiotics for the next 2 days if symptoms do not improve, start taking antibiotics, return to the emergency room immediately if she develops fever or any concerning signs or symptoms. Patient is allergic to penicillins and tetracyclines. I'm unable to prescribe Augmentin, doxycycline, patient will be given azithromycin. She is encouraged follow-up with her primary care provider symptoms continue to persist. Patient verbalized understanding and agreement to today's plan had no further  questions concerns       Final Clinical Impressions(s) / ED Diagnoses   Final diagnoses:  Acute maxillary sinusitis, recurrence not specified    New Prescriptions Discharge Medication List as of 12/16/2015 12:22 AM    START taking these medications   Details  azithromycin (ZITHROMAX) 500 MG tablet Take 1 tablet (500 mg total) by mouth daily., Starting Wed 12/16/2015, Print         [employer], PA-C 12/16/15 [id number]    Tanna Furry, MD 12/21/15 [phone]

## 2015-12-16 NOTE — Discharge Instructions (Signed)
Please read attached information. If you experience any new or worsening signs or symptoms please return to the emergency room for evaluation. Please follow-up with your primary care provider or specialist as discussed. Please use medication prescribed only as directed and discontinue taking if you have any concerning signs or symptoms.   °

## 2015-12-29 ENCOUNTER — Encounter: Payer: Self-pay | Admitting: Family Medicine

## 2015-12-29 NOTE — Telephone Encounter (Signed)
Seen in the ED on 12/15/15 diagnosed with a Sinus infection, please advise    KP

## 2015-12-29 NOTE — Telephone Encounter (Signed)
Probably needs ov

## 2015-12-30 ENCOUNTER — Encounter: Payer: Self-pay | Admitting: Family Medicine

## 2015-12-30 ENCOUNTER — Ambulatory Visit (INDEPENDENT_AMBULATORY_CARE_PROVIDER_SITE_OTHER): Payer: Managed Care, Other (non HMO) | Admitting: Family Medicine

## 2015-12-30 VITALS — BP 118/80 | HR 89 | Temp 98.1°F | Ht 66.0 in | Wt 194.0 lb

## 2015-12-30 DIAGNOSIS — H6983 Other specified disorders of Eustachian tube, bilateral: Secondary | ICD-10-CM | POA: Diagnosis not present

## 2015-12-30 DIAGNOSIS — R0982 Postnasal drip: Secondary | ICD-10-CM | POA: Diagnosis not present

## 2015-12-30 MED ORDER — METHYLPREDNISOLONE 4 MG PO TBPK: ORAL_TABLET | ORAL | 0 refills | Status: DC

## 2015-12-30 NOTE — Progress Notes (Signed)
Pre visit review using our clinic review tool, if applicable. No additional management support is needed unless otherwise documented below in the visit note. 

## 2015-12-30 NOTE — Progress Notes (Signed)
Chief Complaint  Patient presents with  . ER follow-up    for sinus infection-pt states feeling alittle better-still have cough(product) clear with some blood.,pressure,ear pain    Sandy Green here for URI complaints.  Duration: 12 days Associated symptoms: sinus congestion, sinus pain, ear pain (B/l, worse on the R) and productive cough (clear) Denies: fever, ST, SOB, or ear drainage Treatment to date: Azithromycin  Improved slightly but still having symptoms Sick contacts: No  ROS:  Const: Denies fevers HEENT: As noted in HPI Lungs: No SOB  Past Medical History:  Diagnosis Date  . Allergy   . Ankle fracture, left   . Asthma   . Chicken pox   . Heart murmur   . Hyperlipemia   . Hypertension    Family History  Problem Relation Age of Onset  . Hypertension Mother   . Hypertension Father   . Heart disease Father     cabg.6  . Diabetes Maternal Grandmother   . Cancer Maternal Grandmother     Unsure of kind? Breast    BP 118/80 (BP Location: Left Arm, Patient Position: Sitting, Cuff Size: Normal)   Pulse 89   Temp 98.1 F (36.7 C) (Oral)   Ht 5\' 6"  (1.676 m)   Wt 194 lb (88 kg)   SpO2 97%   BMI 31.31 kg/m  General: Awake, alert, appears stated age HEENT: AT, [place], ears patent b/l and TM's neg, nares patent w/o discharge, pharynx pink and without exudates, MMM, +PND, no sinus tenderness Neck: No masses or asymmetry Heart: RRR, no murmurs, no bruits Lungs: CTAB, no accessory muscle use Psych: Age appropriate judgment and insight, normal mood and affect  ETD (eustachian tube dysfunction), bilateral - Plan: methylPREDNISolone (MEDROL) 4 MG TBPK tablet  Post-nasal drip  Orders as above. Instructed the patient that antibiotics are not needed after receiving a course. Also recommended stronger over-the-counter antiallergy medicines that may help with symptoms as well. F/u in 1 week if symptoms worsen or fail to improve. Pt voiced understanding and agreement to the  plan.  [place], DO 12/30/15 1:37 PM

## 2015-12-30 NOTE — Patient Instructions (Signed)
Claritin (loratadine), Allegra (fexofenadine), Zyrtec (cetirizine); these are listed in order from weakest to strongest. Generic, and therefore cheaper, options are in the parentheses.   Flonase (fluticasone); nasal spray that is over the counter. 2 sprays each nostril, once daily. Aim towards the same side eye when you spray.  There are available OTC, and the generic versions, which may be cheaper, are in parentheses. Show this to a pharmacist if you have trouble finding any of these items.  

## 2016-01-21 ENCOUNTER — Encounter: Payer: Self-pay | Admitting: Family Medicine

## 2016-02-03 ENCOUNTER — Encounter: Payer: Self-pay | Admitting: Medical

## 2016-02-03 ENCOUNTER — Ambulatory Visit (INDEPENDENT_AMBULATORY_CARE_PROVIDER_SITE_OTHER): Payer: Managed Care, Other (non HMO) | Admitting: Medical

## 2016-02-03 VITALS — BP 126/86 | Temp 98.1°F | Ht 66.0 in | Wt 194.4 lb

## 2016-02-03 DIAGNOSIS — R0981 Nasal congestion: Secondary | ICD-10-CM

## 2016-02-03 DIAGNOSIS — J0110 Acute frontal sinusitis, unspecified: Secondary | ICD-10-CM | POA: Diagnosis not present

## 2016-02-03 MED ORDER — AZITHROMYCIN 250 MG PO TABS: ORAL_TABLET | ORAL | 0 refills | Status: DC

## 2016-02-03 MED ORDER — PREDNISONE 10 MG PO TABS: ORAL_TABLET | ORAL | 0 refills | Status: DC

## 2016-02-03 NOTE — Patient Instructions (Signed)
You appear to have bronchitis and sinusitis(more sinus infection). Rest hydrate and tylenol for fever. I am prescribing  Benzonatate cough medicine, and 5 day azithromycin antibiotic. For your nasal congestion and possible allergy component will rx 5 day taper prednisone.  If recurrent sinus pressure again over next month or above does not clear symptoms consider levofloxin. Also consider imaging of sinuses.  Follow up in 7-10 days or as needed

## 2016-02-03 NOTE — Progress Notes (Signed)
Subjective:    Patient ID: Sandy Green, female    DOB: July 21, 1960, 55 y.o.   MRN: [id number]  HPI   Pt in feeling stating 2 month struggling with sinus pressure. Pt states over past 2 months ago she was given antibiotic and prednisone. Also states told to take flonase nasal spray. (Treated by Dr. Nani Ravens on 12-30-2015.  Pt states about about a month ago only got 3 day course of azithromycin from the ED(on 12-15-2015).  Pt states never got completely from treatment one month ago. She states had productive cough.  Pt not a smoker.(but some second hand smoke form husband).  If uses nasal steroid left nostril will bleed briefly.(history of nasal bleeds for her with steroids)   Review of Systems  Constitutional: Negative for chills, fatigue and fever.  HENT: Positive for congestion and sinus pressure. Negative for postnasal drip, rhinorrhea and sore throat.   Respiratory: Positive for cough. Negative for shortness of breath and wheezing.   Cardiovascular: Negative for chest pain and palpitations.  Gastrointestinal: Negative for abdominal distention and abdominal pain.  Musculoskeletal: Negative for back pain, myalgias and neck pain.  Skin: Negative for rash.  Neurological: Negative for dizziness and headaches.  Hematological: Negative for adenopathy. Does not bruise/bleed easily.  Psychiatric/Behavioral: Negative for agitation and behavioral problems.    Past Medical History:  Diagnosis Date  . Allergy   . Ankle fracture, left   . Asthma   . Chicken pox   . Heart murmur   . Hyperlipemia   . Hypertension      Social History   Social History  . Marital status: Married    Spouse name: N/A  . Number of children: N/A  . Years of education: N/A   Occupational History  . Not on file.   Social History Main Topics  . Smoking status: Never Smoker  . Smokeless tobacco: Never Used  . Alcohol use No  . Drug use: No  . Sexual activity: Yes    Partners: Male   Other Topics  Concern  . Not on file   Social History Narrative   Exercise-- no    Married,lives with husband.   Unemployed.    No past surgical history on file.  Family History  Problem Relation Age of Onset  . Hypertension Mother   . Hypertension Father   . Heart disease Father     cabg.6  . Diabetes Maternal Grandmother   . Cancer Maternal Grandmother     Unsure of kind? Breast    Allergies  Allergen Reactions  . Penicillins Rash and Other (See Comments)    Reaction unknown  . Tetracyclines & Related Nausea Only and Rash    GI upset    Current Outpatient Prescriptions on File Prior to Visit  Medication Sig Dispense Refill  . albuterol (PROVENTIL HFA;VENTOLIN HFA) 108 (90 BASE) MCG/ACT inhaler Inhale 1-2 puffs into the lungs every 6 (six) hours as needed for wheezing or shortness of breath. 1 Inhaler 0  . atorvastatin (LIPITOR) 20 MG tablet Take 1 tablet (20 mg total) by mouth daily. 90 tablet 1  . beclomethasone (QVAR) 80 MCG/ACT inhaler Inhale 2 puffs into the lungs 2 (two) times daily as needed. 3 Inhaler 2  . escitalopram (LEXAPRO) 10 MG tablet TAKE 1 TABLET (10 MG TOTAL) BY MOUTH DAILY. 90 tablet 1  . loratadine (CLARITIN) 10 MG tablet Take 10 mg by mouth daily.    . methylPREDNISolone (MEDROL) 4 MG TBPK tablet Follow instructions on package.  21 tablet 0  . montelukast (SINGULAIR) 10 MG tablet TAKE 1 TABLET BY MOUTH AT BEDTIME 90 tablet 1  . norethindrone-ethinyl estradiol-iron (MICROGESTIN FE,GILDESS FE,LOESTRIN FE) 1.5-30 MG-MCG tablet Take 1 tablet by mouth daily. 3 Package 4  . valsartan (DIOVAN) 160 MG tablet Take 1 tablet (160 mg total) by mouth daily. 90 tablet 1   No current facility-administered medications on file prior to visit.     BP 126/86 (BP Location: Left Arm, Patient Position: Sitting)   Temp 98.1 F (36.7 C) (Oral)   Ht 5\' 6"  (1.676 m)   Wt 194 lb 6.4 oz (88.2 kg)   BMI 31.38 kg/m       Objective:   Physical Exam  General  Mental Status - Alert.  General Appearance - Well groomed. Not in acute distress.  Skin Rashes- No Rashes.  HEENT Head- Normal. Ear Auditory Canal - Left- Normal. Right - Normal.Tympanic Membrane- Left- Normal. Right- Normal. Eye Sclera/Conjunctiva- Left- Normal. Right- Normal. Nose & Sinuses Nasal Mucosa- Left-  Boggy and Congested. Right-  Boggy and  Congested.Bilateral no  maxillary but some  frontal sinus pressure.(on and off during the day) Mouth & Throat Lips: Upper Lip- Normal: no dryness, cracking, pallor, cyanosis, or vesicular eruption. Lower Lip-Normal: no dryness, cracking, pallor, cyanosis or vesicular eruption. Buccal Mucosa- Bilateral- No Aphthous ulcers. Oropharynx- No Discharge or Erythema. Tonsils: Characteristics- Bilateral- No Erythema or Congestion. Size/Enlargement- Bilateral- No enlargement. Discharge- bilateral-None.  Neck Neck- Supple. No Masses.   Chest and Lung Exam Auscultation: Breath Sounds:-Clear even and unlabored.  Cardiovascular Auscultation:Rythm- Regular, rate and rhythm. Murmurs & Other Heart Sounds:Ausculatation of the heart reveal- No Murmurs.  Lymphatic Head & Neck General Head & Neck Lymphatics: Bilateral: Description- No Localized lymphadenopathy.       Assessment & Plan:   You appear to have bronchitis and sinusitis(more sinus infection). Rest hydrate and tylenol for fever. I am prescribing  Benzonatate cough medicine, and 5 day azithromycin antibiotic. For your nasal congestion and possible allergy component will rx 5 day taper prednisone.(pt states steroid nasal spray causes nose bleeds)  If recurrent sinus pressure again over next month or above does not clear symptoms consider levofloxin. Also consider imaging of sinuses.  Follow up in 7-10 days or as needed  Addisyn Leclaire, Percell Miller, [employer]

## 2016-02-03 NOTE — Progress Notes (Signed)
Pre visit review using our clinic review tool, if applicable. No additional management support is needed unless otherwise documented below in the visit note. 

## 2016-02-04 ENCOUNTER — Encounter: Payer: Self-pay | Admitting: Family Medicine

## 2016-02-04 ENCOUNTER — Telehealth: Payer: Self-pay | Admitting: Family Medicine

## 2016-02-04 NOTE — Telephone Encounter (Signed)
Mailed letter and sent message via My Chart informing patient that Dr. Carollee Herter will be out of the office on March 24, 2016 and we need to reschedule her appointment. Appointment cancelled

## 2016-02-19 NOTE — Telephone Encounter (Signed)
Left message on VM for patient to call and reschedule

## 2016-03-03 ENCOUNTER — Encounter: Payer: Self-pay | Admitting: Family Medicine

## 2016-03-15 IMAGING — MG MM SCREEN MAMMOGRAM BILATERAL
4 series · 4 of 4 positions shown · non-contrast
Comparison: Previous exam(s).

CLINICAL DATA: Screening.

EXAM:
DIGITAL SCREENING BILATERAL MAMMOGRAM WITH CAD

[R CC]
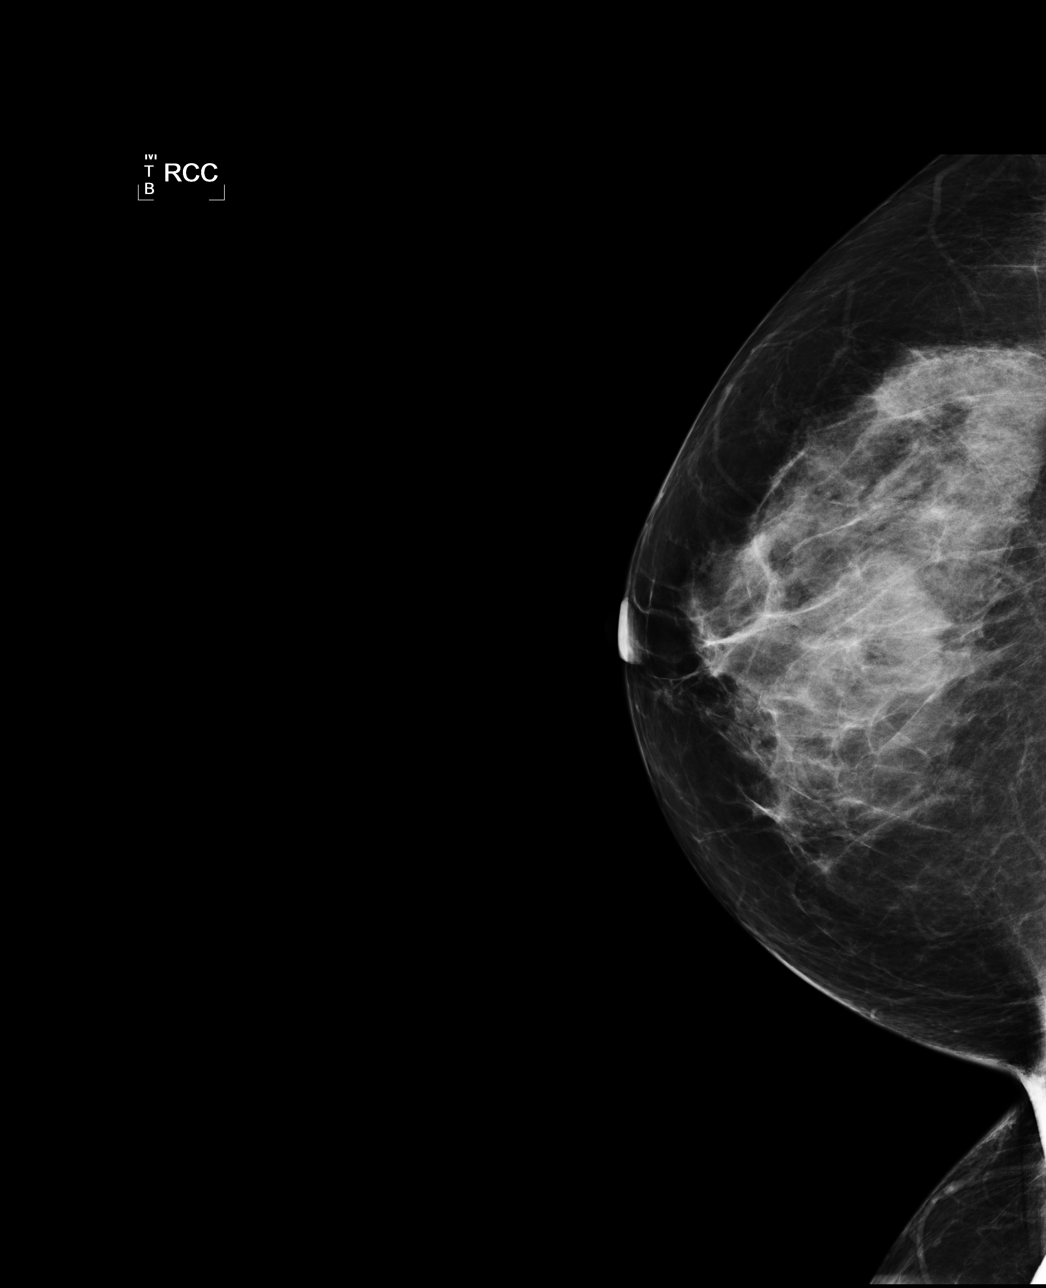

[L CC]
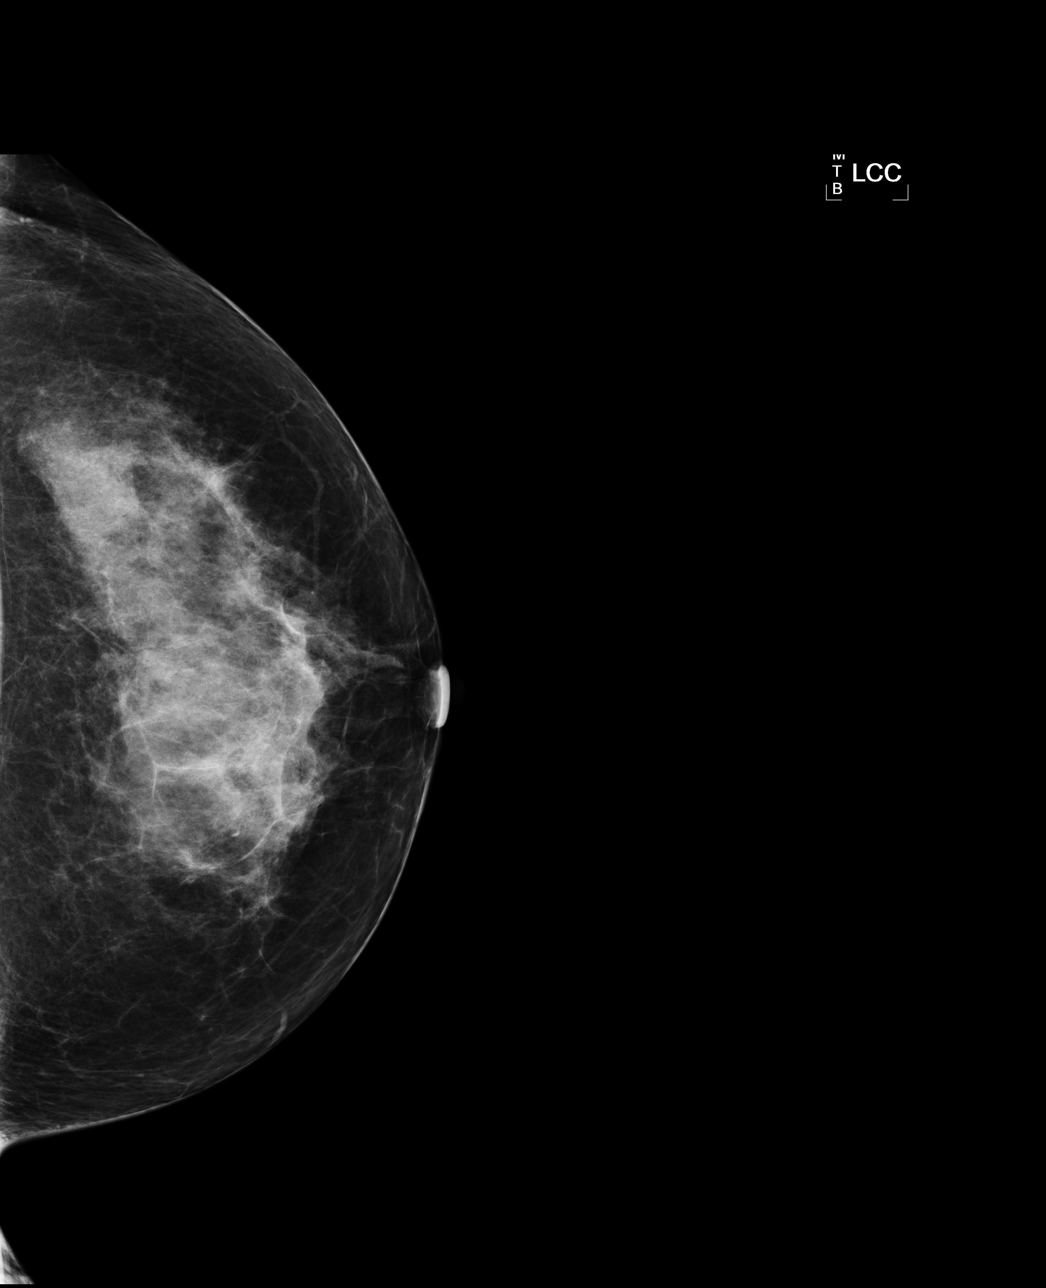

[L MLO]
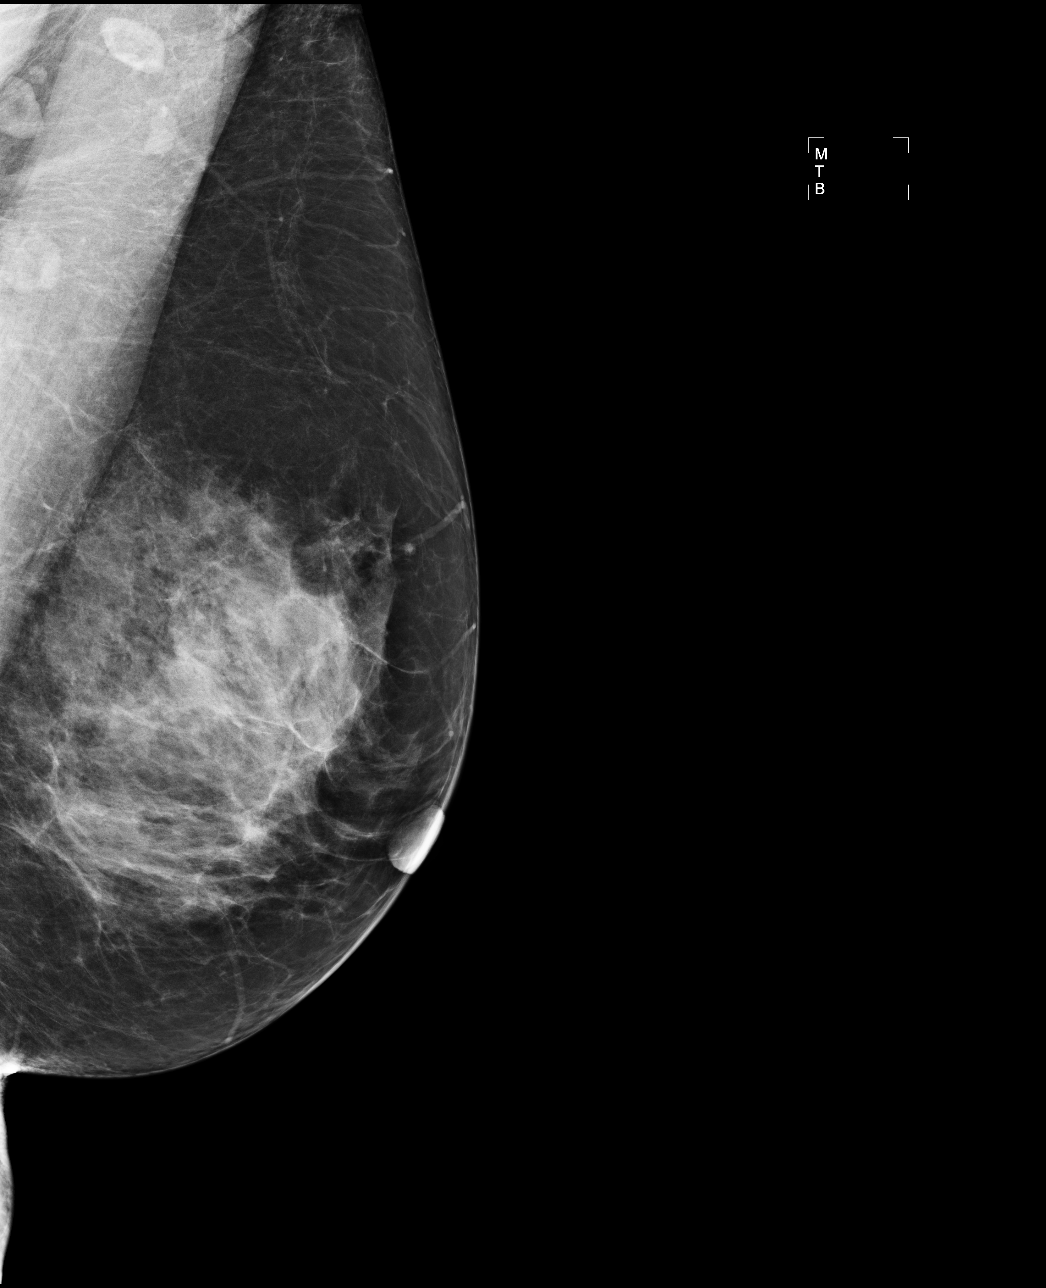

[R MLO]
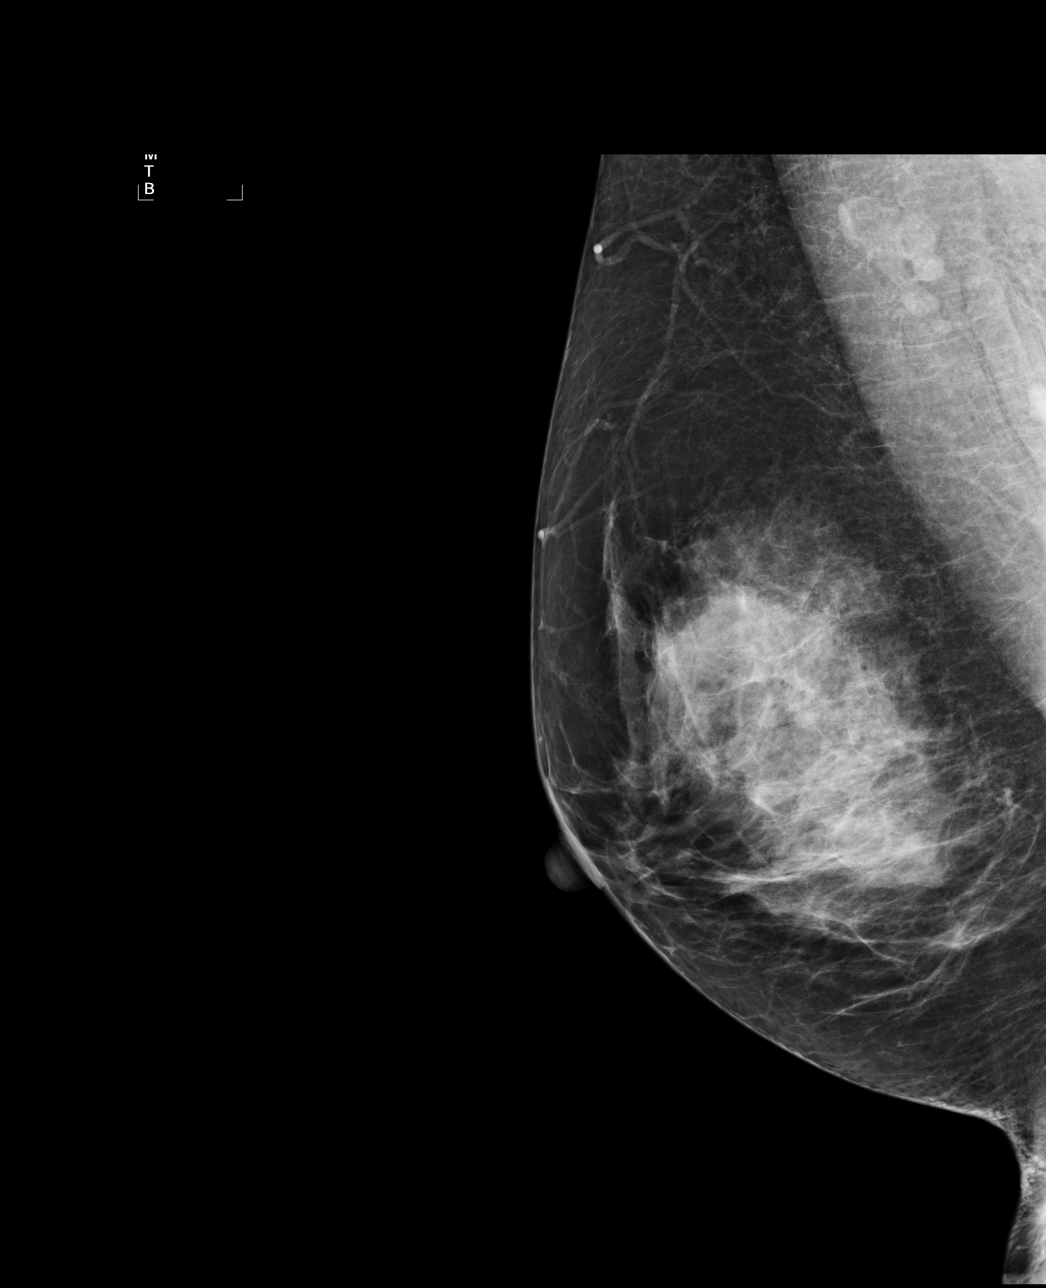

[4 of 4 positions shown; findings below may reference images not displayed]

ACR Breast Density Category c: The breast tissue is heterogeneously
dense, which may obscure small masses.
FINDINGS: There are no findings suspicious for malignancy. Images were
processed with CAD.
IMPRESSION: No mammographic evidence of malignancy. A result letter of this
screening mammogram will be mailed directly to the patient.

RECOMMENDATION:
Screening mammogram in one year. (Code:YJ-2-FEZ)

BI-RADS CATEGORY  1: Negative.

## 2016-03-22 ENCOUNTER — Encounter: Payer: Self-pay | Admitting: Family Medicine

## 2016-03-22 DIAGNOSIS — I10 Essential (primary) hypertension: Secondary | ICD-10-CM

## 2016-03-23 MED ORDER — VALSARTAN 160 MG PO TABS: 160.0000 mg | ORAL_TABLET | Freq: Every day | ORAL | 1 refills | Status: DC

## 2016-03-23 MED ORDER — BECLOMETHASONE DIPROPIONATE 80 MCG/ACT IN AERS: 2.0000 | INHALATION_SPRAY | Freq: Two times a day (BID) | RESPIRATORY_TRACT | 2 refills | Status: DC | PRN

## 2016-03-24 ENCOUNTER — Encounter: Payer: Managed Care, Other (non HMO) | Admitting: Family Medicine

## 2016-04-29 ENCOUNTER — Encounter: Payer: Self-pay | Admitting: Family Medicine

## 2016-04-29 ENCOUNTER — Ambulatory Visit (INDEPENDENT_AMBULATORY_CARE_PROVIDER_SITE_OTHER): Payer: Managed Care, Other (non HMO) | Admitting: Family Medicine

## 2016-04-29 VITALS — BP 108/70 | HR 68 | Temp 98.7°F | Resp 16 | Ht 66.0 in | Wt 197.8 lb

## 2016-04-29 DIAGNOSIS — Z0000 Encounter for general adult medical examination without abnormal findings: Secondary | ICD-10-CM | POA: Diagnosis not present

## 2016-04-29 DIAGNOSIS — I10 Essential (primary) hypertension: Secondary | ICD-10-CM

## 2016-04-29 DIAGNOSIS — E785 Hyperlipidemia, unspecified: Secondary | ICD-10-CM | POA: Diagnosis not present

## 2016-04-29 DIAGNOSIS — R079 Chest pain, unspecified: Secondary | ICD-10-CM | POA: Diagnosis not present

## 2016-04-29 DIAGNOSIS — F419 Anxiety disorder, unspecified: Secondary | ICD-10-CM

## 2016-04-29 DIAGNOSIS — R8290 Unspecified abnormal findings in urine: Secondary | ICD-10-CM

## 2016-04-29 DIAGNOSIS — Z1211 Encounter for screening for malignant neoplasm of colon: Secondary | ICD-10-CM

## 2016-04-29 LAB — CBC
HEMATOCRIT: 41.3 % (ref 35.0–45.0)
Hemoglobin: 13.5 g/dL (ref 11.7–15.5)
MCH: 29.5 pg (ref 27.0–33.0)
MCHC: 32.7 g/dL (ref 32.0–36.0)
MCV: 90.4 fL (ref 80.0–100.0)
MPV: 11.1 fL (ref 7.5–12.5)
Platelets: 369 10*3/uL (ref 140–400)
RBC: 4.57 MIL/uL (ref 3.80–5.10)
RDW: 13.9 % (ref 11.0–15.0)
WBC: 9.5 10*3/uL (ref 3.8–10.8)

## 2016-04-29 LAB — POC URINALSYSI DIPSTICK (AUTOMATED)
BILIRUBIN UA: NEGATIVE
Glucose, UA: NEGATIVE
KETONES UA: NEGATIVE
Nitrite, UA: NEGATIVE
PH UA: 6
Spec Grav, UA: 1.015
Urobilinogen, UA: 0.2

## 2016-04-29 LAB — TSH: TSH: 1.15 m[IU]/L

## 2016-04-29 MED ORDER — ESCITALOPRAM OXALATE 10 MG PO TABS: ORAL_TABLET | ORAL | 1 refills | Status: DC

## 2016-04-29 NOTE — Patient Instructions (Signed)
Preventive Care 40-64 Years, Female Preventive care refers to lifestyle choices and visits with your health care provider that can promote health and wellness. What does preventive care include?  A yearly physical exam. This is also called an annual well check.  Dental exams once or twice a year.  Routine eye exams. Ask your health care provider how often you should have your eyes checked.  Personal lifestyle choices, including:  Daily care of your teeth and gums.  Regular physical activity.  Eating a healthy diet.  Avoiding tobacco and drug use.  Limiting alcohol use.  Practicing safe sex.  Taking low-dose aspirin daily starting at age 54.  Taking vitamin and mineral supplements as recommended by your health care provider. What happens during an annual well check? The services and screenings done by your health care provider during your annual well check will depend on your age, overall health, lifestyle risk factors, and family history of disease. Counseling  Your health care provider may ask you questions about your:  Alcohol use.  Tobacco use.  Drug use.  Emotional well-being.  Home and relationship well-being.  Sexual activity.  Eating habits.  Work and work environment.  Method of birth control.  Menstrual cycle.  Pregnancy history. Screening  You may have the following tests or measurements:  Height, weight, and BMI.  Blood pressure.  Lipid and cholesterol levels. These may be checked every 5 years, or more frequently if you are over 54 years old.  Skin check.  Lung cancer screening. You may have this screening every year starting at age 54 if you have a 30-pack-year history of smoking and currently smoke or have quit within the past 15 years.  Fecal occult blood test (FOBT) of the stool. You may have this test every year starting at age 54.  Flexible sigmoidoscopy or colonoscopy. You may have a sigmoidoscopy every 5 years or a colonoscopy  every 10 years starting at age 54.  Hepatitis C blood test.  Hepatitis B blood test.  Sexually transmitted disease (STD) testing.  Diabetes screening. This is done by checking your blood sugar (glucose) after you have not eaten for a while (fasting). You may have this done every 1-3 years.  Mammogram. This may be done every 1-2 years. Talk to your health care provider about when you should start having regular mammograms. This may depend on whether you have a family history of breast cancer.  BRCA-related cancer screening. This may be done if you have a family history of breast, ovarian, tubal, or peritoneal cancers.  Pelvic exam and Pap test. This may be done every 3 years starting at age 54. Starting at age 54, this may be done every 5 years if you have a Pap test in combination with an HPV test.  Bone density scan. This is done to screen for osteoporosis. You may have this scan if you are at high risk for osteoporosis. Discuss your test results, treatment options, and if necessary, the need for more tests with your health care provider. Vaccines  Your health care provider may recommend certain vaccines, such as:  Influenza vaccine. This is recommended every year.  Tetanus, diphtheria, and acellular pertussis (Tdap, Td) vaccine. You may need a Td booster every 10 years.  Varicella vaccine. You may need this if you have not been vaccinated.  Zoster vaccine. You may need this after age 54.  Measles, mumps, and rubella (MMR) vaccine. You may need at least one dose of MMR if you were born   in 1957 or later. You may also need a second dose.  Pneumococcal 13-valent conjugate (PCV13) vaccine. You may need this if you have certain conditions and were not previously vaccinated.  Pneumococcal polysaccharide (PPSV23) vaccine. You may need one or two doses if you smoke cigarettes or if you have certain conditions.  Meningococcal vaccine. You may need this if you have certain  conditions.  Hepatitis A vaccine. You may need this if you have certain conditions or if you travel or work in places where you may be exposed to hepatitis A.  Hepatitis B vaccine. You may need this if you have certain conditions or if you travel or work in places where you may be exposed to hepatitis B.  Haemophilus influenzae type b (Hib) vaccine. You may need this if you have certain conditions. Talk to your health care provider about which screenings and vaccines you need and how often you need them. This information is not intended to replace advice given to you by your health care provider. Make sure you discuss any questions you have with your health care provider. Document Released: 04/17/2015 Document Revised: 12/09/2015 Document Reviewed: 01/20/2015 Elsevier Interactive Patient Education  2017 [employer].

## 2016-04-29 NOTE — Progress Notes (Signed)
Subjective:     Sandy Green is a 56 y.o. female and is here for a comprehensive physical exam. The patient reports no problems.  Social History   Social History  . Marital status: Married    Spouse name: N/A  . Number of children: N/A  . Years of education: N/A   Occupational History  . Not on file.   Social History Main Topics  . Smoking status: Never Smoker  . Smokeless tobacco: Never Used  . Alcohol use No  . Drug use: No  . Sexual activity: Yes    Partners: Male   Other Topics Concern  . Not on file   Social History Narrative   Exercise-- no    Married,lives with husband.   Unemployed.   Health Maintenance  Topic Date Due  . HIV Screening  05/22/1977  . COLONOSCOPY  05/22/2012  . MAMMOGRAM  05/27/2017  . TETANUS/TDAP  02/27/2018  . PAP SMEAR  03/22/2018  . INFLUENZA VACCINE  Completed  . Hepatitis C Screening  Completed    The following portions of the patient's history were reviewed and updated as appropriate:  She  has a past medical history of Allergy; Ankle fracture, left; Asthma; Chicken pox; Heart murmur; Hyperlipemia; and Hypertension. She  does not have any pertinent problems on file. She  has no past surgical history on file. Her family history includes Cancer in her maternal grandmother; Diabetes in her maternal grandmother; Heart disease in her father; Hypertension in her father and mother. She  reports that she has never smoked. She has never used smokeless tobacco. She reports that she does not drink alcohol or use drugs. She has a current medication list which includes the following prescription(s): albuterol, atorvastatin, beclomethasone, escitalopram, montelukast, norethindrone-ethinyl estradiol-iron, ranitidine, valsartan, azithromycin, loratadine, methylprednisolone, and prednisone. Current Outpatient Prescriptions on File Prior to Visit  Medication Sig Dispense Refill  . albuterol (PROVENTIL HFA;VENTOLIN HFA) 108 (90 BASE) MCG/ACT inhaler  Inhale 1-2 puffs into the lungs every 6 (six) hours as needed for wheezing or shortness of breath. 1 Inhaler 0  . atorvastatin (LIPITOR) 20 MG tablet Take 1 tablet (20 mg total) by mouth daily. 90 tablet 1  . beclomethasone (QVAR) 80 MCG/ACT inhaler Inhale 2 puffs into the lungs 2 (two) times daily as needed. 3 Inhaler 2  . montelukast (SINGULAIR) 10 MG tablet TAKE 1 TABLET BY MOUTH AT BEDTIME 90 tablet 1  . norethindrone-ethinyl estradiol-iron (MICROGESTIN FE,GILDESS FE,LOESTRIN FE) 1.5-30 MG-MCG tablet Take 1 tablet by mouth daily. 3 Package 4  . ranitidine (ZANTAC) 75 MG tablet Take 75 mg by mouth 2 (two) times daily.    . valsartan (DIOVAN) 160 MG tablet Take 1 tablet (160 mg total) by mouth daily. 90 tablet 1  . azithromycin (ZITHROMAX) 250 MG tablet Take 2 tablets by mouth on day 1, followed by 1 tablet by mouth daily for 4 days. (Patient not taking: Reported on 04/29/2016) 6 tablet 0  . loratadine (CLARITIN) 10 MG tablet Take 10 mg by mouth daily.    . methylPREDNISolone (MEDROL) 4 MG TBPK tablet Follow instructions on package. (Patient not taking: Reported on 04/29/2016) 21 tablet 0  . predniSONE (DELTASONE) 10 MG tablet 5 tab po day 1, 4 tab po day 2, 3 tab po day 3, 2 tab po day 4 and 1 tab po day 5 (Patient not taking: Reported on 04/29/2016) 15 tablet 0   No current facility-administered medications on file prior to visit.    She is allergic to  penicillins and tetracyclines & related..  ekg-- nsr, no acute changes Review of Systems Review of Systems  Constitutional: Negative for activity change, appetite change and fatigue.  HENT: Negative for hearing loss, congestion, tinnitus and ear discharge.  dentist q24m Eyes: Negative for visual disturbance (see optho q1y -- vision corrected to 20/20 with glasses).  Respiratory: Negative for cough, chest tightness and shortness of breath.   Cardiovascular: Negative for chest pain, palpitations and leg swelling.  Gastrointestinal: Negative for  abdominal pain, diarrhea, constipation and abdominal distention.  Genitourinary: Negative for urgency, frequency, decreased urine volume and difficulty urinating.  Musculoskeletal: Negative for back pain, arthralgias and gait problem.  Skin: Negative for color change, pallor and rash.  Neurological: Negative for dizziness, light-headedness, numbness and headaches.  Hematological: Negative for adenopathy. Does not bruise/bleed easily.  Psychiatric/Behavioral: Negative for suicidal ideas, confusion, sleep disturbance, self-injury, dysphoric mood, decreased concentration and agitation.       Objective:    BP 108/70 (BP Location: Left Arm, Patient Position: Sitting, Cuff Size: Normal)   Pulse 68   Temp 98.7 F (37.1 C) (Oral)   Resp 16   Ht 5\' 6"  (1.676 m)   Wt 197 lb 12.8 oz (89.7 kg)   SpO2 98%   BMI 31.93 kg/m  General appearance: alert, cooperative, appears stated age and no distress Head: Normocephalic, without obvious abnormality, atraumatic Eyes: conjunctivae/corneas clear. PERRL, EOM's intact. Fundi benign. Ears: normal TM's and external ear canals both ears Nose: Nares normal. Septum midline. Mucosa normal. No drainage or sinus tenderness. Throat: lips, mucosa, and tongue normal; teeth and gums normal Neck: no adenopathy, no carotid bruit, no JVD, supple, symmetrical, trachea midline and thyroid not enlarged, symmetric, no tenderness/mass/nodules Back: symmetric, no curvature. ROM normal. No CVA tenderness. Lungs: clear to auscultation bilaterally Breasts: normal appearance, no masses or tenderness Heart: regular rate and rhythm, S1, S2 normal, no murmur, click, rub or gallop Abdomen: soft, non-tender; bowel sounds normal; no masses,  no organomegaly Pelvic: deferred Extremities: extremities normal, atraumatic, no cyanosis or edema Pulses: 2+ and symmetric Skin: Skin color, texture, turgor normal. No rashes or lesions Lymph nodes: Cervical, supraclavicular, and axillary  nodes normal. Neurologic: Alert and oriented X 3, normal strength and tone. Normal symmetric reflexes. Normal coordination and gait    Assessment:    Healthy female exam.      Plan:    ghm utd Check labs See After Visit Summary for Counseling Recommendations    1. Preventative health care See above - Comprehensive metabolic panel - CBC - Lipid panel - TSH - POCT Urinalysis Dipstick (Automated)  2. Essential hypertension Stable  - Comprehensive metabolic panel - CBC  3. Hyperlipidemia, unspecified hyperlipidemia type Check labs - Lipid panel  4. Encounter for screening colonoscopy   - Ambulatory referral to Gastroenterology  5. Anxiety stable - escitalopram (LEXAPRO) 10 MG tablet; TAKE 1 TABLET (10 MG TOTAL) BY MOUTH DAILY.  Dispense: 90 tablet; Refill: 1  6. Chest pain, unspecified type Ekg, normal-- pt has been falling asleep on her fist It has been better since she has noticed this and has stopped - EKG 12-Lead  7. Abnormal urine - Urine Culture

## 2016-04-29 NOTE — Progress Notes (Signed)
Pre visit review using our clinic review tool, if applicable. No additional management support is needed unless otherwise documented below in the visit note. 

## 2016-04-29 NOTE — Progress Notes (Signed)
Patient ID: Sandy Green, female    DOB: 06-15-1960  Age: 56 y.o. MRN: [id number]    Subjective:  Subjective  HPI Sandy Green presents for annual physical.  Review of Systems  Constitutional: Negative.   HENT: Negative.   Eyes: Negative.   Respiratory: Negative.   Cardiovascular: Negative.   Gastrointestinal: Negative.   Endocrine: Negative.   Genitourinary: Negative.   Musculoskeletal: Negative.   Skin: Negative.   Allergic/Immunologic: Negative.   Neurological: Negative.   Hematological: Negative.   Psychiatric/Behavioral: Negative.     History Past Medical History:  Diagnosis Date  . Allergy   . Ankle fracture, left   . Asthma   . Chicken pox   . Heart murmur   . Hyperlipemia   . Hypertension     She has no past surgical history on file.   Her family history includes Cancer in her maternal grandmother; Diabetes in her maternal grandmother; Heart disease in her father; Hypertension in her father and mother.She reports that she has never smoked. She has never used smokeless tobacco. She reports that she does not drink alcohol or use drugs.  Current Outpatient Prescriptions on File Prior to Visit  Medication Sig Dispense Refill  . albuterol (PROVENTIL HFA;VENTOLIN HFA) 108 (90 BASE) MCG/ACT inhaler Inhale 1-2 puffs into the lungs every 6 (six) hours as needed for wheezing or shortness of breath. 1 Inhaler 0  . atorvastatin (LIPITOR) 20 MG tablet Take 1 tablet (20 mg total) by mouth daily. 90 tablet 1  . azithromycin (ZITHROMAX) 250 MG tablet Take 2 tablets by mouth on day 1, followed by 1 tablet by mouth daily for 4 days. 6 tablet 0  . beclomethasone (QVAR) 80 MCG/ACT inhaler Inhale 2 puffs into the lungs 2 (two) times daily as needed. 3 Inhaler 2  . escitalopram (LEXAPRO) 10 MG tablet TAKE 1 TABLET (10 MG TOTAL) BY MOUTH DAILY. 90 tablet 1  . loratadine (CLARITIN) 10 MG tablet Take 10 mg by mouth daily.    . methylPREDNISolone (MEDROL) 4 MG TBPK tablet Follow  instructions on package. 21 tablet 0  . montelukast (SINGULAIR) 10 MG tablet TAKE 1 TABLET BY MOUTH AT BEDTIME 90 tablet 1  . norethindrone-ethinyl estradiol-iron (MICROGESTIN FE,GILDESS FE,LOESTRIN FE) 1.5-30 MG-MCG tablet Take 1 tablet by mouth daily. 3 Package 4  . predniSONE (DELTASONE) 10 MG tablet 5 tab po day 1, 4 tab po day 2, 3 tab po day 3, 2 tab po day 4 and 1 tab po day 5 15 tablet 0  . ranitidine (ZANTAC) 75 MG tablet Take 75 mg by mouth 2 (two) times daily.    . valsartan (DIOVAN) 160 MG tablet Take 1 tablet (160 mg total) by mouth daily. 90 tablet 1   No current facility-administered medications on file prior to visit.      Objective:  Objective  Physical Exam There were no vitals taken for this visit. Wt Readings from Last 3 Encounters:  02/03/16 194 lb 6.4 oz (88.2 kg)  12/30/15 194 lb (88 kg)  12/15/15 190 lb (86.2 kg)     Lab Results  Component Value Date   WBC 8.0 03/24/2015   HGB 13.4 03/24/2015   HCT 41.1 03/24/2015   PLT 318.0 03/24/2015   GLUCOSE 88 09/21/2015   CHOL 165 09/21/2015   TRIG 85.0 09/21/2015   HDL 49.30 09/21/2015   LDLCALC 98 09/21/2015   ALT 13 09/21/2015   AST 16 09/21/2015   NA 139 09/21/2015   K 4.5 09/21/2015  CL 107 09/21/2015   CREATININE 1.01 09/21/2015   BUN 14 09/21/2015   CO2 24 09/21/2015   TSH 2.79 03/24/2015    No results found.   Assessment & Plan:  Plan  I am having Ms. Sanderford maintain her albuterol, norethindrone-ethinyl estradiol-iron, loratadine, montelukast, escitalopram, atorvastatin, methylPREDNISolone, ranitidine, azithromycin, predniSONE, beclomethasone, and valsartan.  No orders of the defined types were placed in this encounter.   Problem List Items Addressed This Visit    None      Follow-up: No Follow-up on file.  Rip Harbour, LPN

## 2016-04-30 LAB — LIPID PANEL
CHOL/HDL RATIO: 3.2 ratio (ref <5.0)
Cholesterol: 175 mg/dL (ref <200)
HDL: 55 mg/dL (ref >50)
LDL CALC: 102 mg/dL — AB (ref <100)
Triglycerides: 90 mg/dL (ref <150)
VLDL: 18 mg/dL (ref <30)

## 2016-04-30 LAB — COMPREHENSIVE METABOLIC PANEL
ALBUMIN: 3.4 g/dL — AB (ref 3.6–5.1)
ALT: 15 U/L (ref 6–29)
AST: 18 U/L (ref 10–35)
Alkaline Phosphatase: 68 U/L (ref 33–130)
BUN: 21 mg/dL (ref 7–25)
CHLORIDE: 107 mmol/L (ref 98–110)
CO2: 24 mmol/L (ref 20–31)
Calcium: 8.6 mg/dL (ref 8.6–10.4)
Creat: 1.13 mg/dL — ABNORMAL HIGH (ref 0.50–1.05)
GLUCOSE: 86 mg/dL (ref 65–99)
Potassium: 4.2 mmol/L (ref 3.5–5.3)
SODIUM: 140 mmol/L (ref 135–146)
Total Bilirubin: 0.7 mg/dL (ref 0.2–1.2)
Total Protein: 6.8 g/dL (ref 6.1–8.1)

## 2016-05-01 LAB — URINE CULTURE: ORGANISM ID, BACTERIA: NO GROWTH

## 2016-05-04 ENCOUNTER — Telehealth: Payer: Self-pay

## 2016-05-04 DIAGNOSIS — N289 Disorder of kidney and ureter, unspecified: Secondary | ICD-10-CM

## 2016-05-04 NOTE — Telephone Encounter (Signed)
Future lab entered, rechech BMP in 3 mth. LB

## 2016-05-11 ENCOUNTER — Other Ambulatory Visit: Payer: Self-pay | Admitting: Family Medicine

## 2016-05-11 ENCOUNTER — Encounter: Payer: Self-pay | Admitting: Gastroenterology

## 2016-05-11 DIAGNOSIS — Z1231 Encounter for screening mammogram for malignant neoplasm of breast: Secondary | ICD-10-CM

## 2016-05-31 ENCOUNTER — Ambulatory Visit: Payer: Managed Care, Other (non HMO)

## 2016-06-02 ENCOUNTER — Other Ambulatory Visit: Payer: Self-pay

## 2016-06-02 ENCOUNTER — Encounter: Payer: Self-pay | Admitting: Family Medicine

## 2016-06-02 MED ORDER — ATORVASTATIN CALCIUM 20 MG PO TABS: 20.0000 mg | ORAL_TABLET | Freq: Every day | ORAL | 1 refills | Status: DC

## 2016-06-02 MED ORDER — MONTELUKAST SODIUM 10 MG PO TABS: ORAL_TABLET | ORAL | 1 refills | Status: DC

## 2016-06-02 MED ORDER — NORETHIN ACE-ETH ESTRAD-FE 1.5-30 MG-MCG PO TABS: 1.0000 | ORAL_TABLET | Freq: Every day | ORAL | 4 refills | Status: DC

## 2016-06-03 MED ORDER — MONTELUKAST SODIUM 10 MG PO TABS: ORAL_TABLET | ORAL | 1 refills | Status: DC

## 2016-06-03 MED ORDER — NORETHIN ACE-ETH ESTRAD-FE 1.5-30 MG-MCG PO TABS: 1.0000 | ORAL_TABLET | Freq: Every day | ORAL | 4 refills | Status: DC

## 2016-06-03 MED ORDER — ATORVASTATIN CALCIUM 20 MG PO TABS: 20.0000 mg | ORAL_TABLET | Freq: Every day | ORAL | 1 refills | Status: DC

## 2016-06-28 ENCOUNTER — Other Ambulatory Visit: Payer: Self-pay | Admitting: Family Medicine

## 2016-06-28 ENCOUNTER — Encounter: Payer: Self-pay | Admitting: Family Medicine

## 2016-06-28 MED ORDER — BECLOMETHASONE DIPROP HFA 80 MCG/ACT IN AERB: 2.0000 | INHALATION_SPRAY | Freq: Two times a day (BID) | RESPIRATORY_TRACT | 2 refills | Status: DC

## 2016-06-28 NOTE — Telephone Encounter (Signed)
Please change to qvar redihaler

## 2016-07-04 ENCOUNTER — Ambulatory Visit (AMBULATORY_SURGERY_CENTER): Payer: Self-pay

## 2016-07-04 VITALS — Ht 66.0 in | Wt 195.6 lb

## 2016-07-04 DIAGNOSIS — Z1211 Encounter for screening for malignant neoplasm of colon: Secondary | ICD-10-CM

## 2016-07-04 MED ORDER — NA SULFATE-K SULFATE-MG SULF 17.5-3.13-1.6 GM/177ML PO SOLN: 1.0000 | Freq: Once | ORAL | 0 refills | Status: AC

## 2016-07-04 NOTE — Progress Notes (Signed)
Denies allergies to eggs or soy products. Denies complication of anesthesia or sedation. Denies use of weight loss medication. Denies use of O2.   Emmi instructions declined.  

## 2016-07-06 ENCOUNTER — Encounter: Payer: Self-pay | Admitting: Gastroenterology

## 2016-07-14 ENCOUNTER — Ambulatory Visit (HOSPITAL_COMMUNITY)
Admission: EM | Admit: 2016-07-14 | Discharge: 2016-07-14 | Disposition: A | Discharge status: Home / Self Care | Payer: Managed Care, Other (non HMO) | Attending: Family Medicine | Admitting: Family Medicine

## 2016-07-14 ENCOUNTER — Encounter (HOSPITAL_COMMUNITY): Payer: Self-pay | Admitting: Emergency Medicine

## 2016-07-14 DIAGNOSIS — S61211A Laceration without foreign body of left index finger without damage to nail, initial encounter: Secondary | ICD-10-CM | POA: Diagnosis not present

## 2016-07-14 DIAGNOSIS — Z23 Encounter for immunization: Secondary | ICD-10-CM

## 2016-07-14 MED ORDER — TETANUS-DIPHTH-ACELL PERTUSSIS 5-2.5-18.5 LF-MCG/0.5 IM SUSP
0.5000 mL | Freq: Once | INTRAMUSCULAR | Status: AC
  Administered 2016-07-14: 0.5 mL via INTRAMUSCULAR

## 2016-07-14 MED ORDER — TETANUS-DIPHTH-ACELL PERTUSSIS 5-2.5-18.5 LF-MCG/0.5 IM SUSP
INTRAMUSCULAR | Status: AC
  Filled 2016-07-14: qty 0.5

## 2016-07-14 NOTE — ED Provider Notes (Addendum)
[place]    CSN: [id number] Arrival date & time: 07/14/16  1741     History   Chief Complaint Chief Complaint  Patient presents with  . Laceration    HPI Sandy Green is a 56 y.o. female.   Patient cut her finger with sewing shears.  Laceration to left index finger tip.  Last tetanus was 9 years ago      Past Medical History:  Diagnosis Date  . Allergy   . Ankle fracture, left   . Anxiety   . Asthma   . Chicken pox   . Elbow fracture   . Heart murmur   . Hyperlipemia   . Hypertension     Patient Active Problem List   Diagnosis Date Noted  . Left wrist pain 09/21/2015  . Pain of left heel 09/21/2015  . Vaginitis and vulvovaginitis 03/23/2015  . Rash and nonspecific skin eruption 01/01/2015  . HTN (hypertension) 06/16/2014  . Conjunctivitis 04/09/2014  . Hyperlipidemia 02/28/2013  . Multiple allergies 02/28/2013  . Asthma, mild intermittent 02/28/2013  . Obesity (BMI 30-39.9) 02/27/2013    Past Surgical History:  Procedure Laterality Date  . ELBOW FRACTURE SURGERY Left   . WISDOM TOOTH EXTRACTION      OB History    No data available       Home Medications    Prior to Admission medications   Medication Sig Start Date End Date Taking? Authorizing Provider  albuterol (PROVENTIL HFA;VENTOLIN HFA) 108 (90 BASE) MCG/ACT inhaler Inhale 1-2 puffs into the lungs every 6 (six) hours as needed for wheezing or shortness of breath. 05/03/14   Harden Mo, MD  atorvastatin (LIPITOR) 20 MG tablet Take 1 tablet (20 mg total) by mouth daily. 06/03/16   Rosalita Chessman Chase, DO  Beclomethasone Diprop HFA (QVAR REDIHALER) 80 MCG/ACT AERB Inhale 2 puffs into the lungs 2 (two) times daily. 06/28/16   Alferd Apa Lowne Chase, DO  escitalopram (LEXAPRO) 10 MG tablet TAKE 1 TABLET (10 MG TOTAL) BY MOUTH DAILY. 04/29/16   Rosalita Chessman Chase, DO  Levocetirizine Dihydrochloride (XYZAL ALLERGY 24HR PO) Take 1 tablet by mouth daily.    Historical Provider, MD    montelukast (SINGULAIR) 10 MG tablet TAKE 1 TABLET BY MOUTH AT BEDTIME 06/03/16   Rosalita Chessman Chase, DO  norethindrone-ethinyl estradiol-iron (MICROGESTIN FE,GILDESS FE,LOESTRIN FE) 1.5-30 MG-MCG tablet Take 1 tablet by mouth daily. 06/03/16   Alferd Apa Lowne Chase, DO  valsartan (DIOVAN) 160 MG tablet Take 1 tablet (160 mg total) by mouth daily. 03/23/16   Ann Held, DO    Family History Family History  Problem Relation Age of Onset  . Hypertension Mother   . Hypertension Father   . Heart disease Father     cabg.[id number]  . Diabetes Maternal Grandmother   . Cancer Maternal Grandmother     Unsure of kind? Breast  . Colon cancer Neg Hx   . Esophageal cancer Neg Hx   . Rectal cancer Neg Hx   . Stomach cancer Neg Hx     Social History Social History  Substance Use Topics  . Smoking status: Never Smoker  . Smokeless tobacco: Never Used  . Alcohol use 0.0 oz/week     Comment: Rare     Allergies   Penicillins and Tetracyclines & related   Review of Systems Review of Systems  Skin: Positive for wound.  All other systems reviewed and are negative.    Physical Exam Triage  Vital Signs ED Triage Vitals  Enc Vitals Group     BP 07/14/16 1820 (!) 141/92     Pulse Rate 07/14/16 1820 81     Resp 07/14/16 1820 16     Temp 07/14/16 1820 98.2 F (36.8 C)     Temp Source 07/14/16 1820 Oral     SpO2 07/14/16 1820 98 %     Weight --      Height --      Head Circumference --      Peak Flow --      Pain Score 07/14/16 1818 1     Pain Loc --      Pain Edu? --      Excl. in [place]? --    No data found.   Updated Vital Signs BP (!) 141/92 (BP Location: Left Arm)   Pulse 81   Temp 98.2 F (36.8 C) (Oral)   Resp 16   SpO2 98%    Physical Exam  Constitutional: She is oriented to person, place, and time. She appears well-developed and well-nourished.  HENT:  Head: Normocephalic.  Right Ear: External ear normal.  Left Ear: External ear normal.  Mouth/Throat:  Oropharynx is clear and moist.  Eyes: Conjunctivae are normal. Pupils are equal, round, and reactive to light.  Neck: Normal range of motion. Neck supple.  Pulmonary/Chest: Effort normal.  Musculoskeletal: Normal range of motion.  Neurological: She is alert and oriented to person, place, and time.  Skin:  1 cm laceration, diagonal, distal index finger on the left  Nursing note and vitals reviewed.    UC Treatments / Results  Labs (all labs ordered are listed, but only abnormal results are displayed) Labs Reviewed - No data to display  EKG  EKG Interpretation None       Radiology No results found.  Procedures .Marland KitchenLaceration Repair Date/Time: 07/14/2016 6:35 PM Performed by: Robyn Haber Authorized by: Robyn Haber   Consent:    Consent obtained:  Verbal   Consent given by:  Patient   Risks discussed:  Pain   Alternatives discussed:  No treatment Anesthesia (see MAR for exact dosages):    Anesthesia method:  None Laceration details:    Location:  Finger   Finger location:  L index finger Repair type:    Repair type:  Simple Treatment:    Amount of cleaning:  Standard   Irrigation solution:  Sterile water   Visualized foreign bodies/material removed: no   Skin repair:    Repair method:  Tissue adhesive Approximation:    Approximation:  Close   Vermilion border: well-aligned   Post-procedure details:    Dressing:  Open (no dressing)   Patient tolerance of procedure:  Tolerated well, no immediate complications   (including critical care time)  Medications Ordered in UC Medications  Tdap (BOOSTRIX) injection 0.5 mL (not administered)     Initial Impression / Assessment and Plan / UC Course  I have reviewed the triage vital signs and the nursing notes.  Pertinent labs & imaging results that were available during my care of the patient were reviewed by me and considered in my medical decision making (see chart for details).     Final Clinical  Impressions(s) / UC Diagnoses   Final diagnoses:  Laceration of left index finger without foreign body, nail damage status unspecified, initial encounter    New Prescriptions New Prescriptions   No medications on file     Robyn Haber, MD 07/14/16 Bosie Helper  Robyn Haber, MD 07/24/16 1726

## 2016-07-14 NOTE — ED Notes (Signed)
Tetanus was in 2009

## 2016-07-14 NOTE — Discharge Instructions (Signed)
Reapplied the adhesive bandage with new Steri-Strip as needed. He may wash the finger normally at this point.  The only way to get the glue off would be to apply petroleum jelly or something in a petroleum jelly base so avoid this.

## 2016-07-14 NOTE — ED Triage Notes (Signed)
Patient cut her finger with sewing shears.  Laceration to left index finger tip.

## 2016-07-18 ENCOUNTER — Ambulatory Visit (AMBULATORY_SURGERY_CENTER): Payer: Managed Care, Other (non HMO) | Admitting: Gastroenterology

## 2016-07-18 ENCOUNTER — Encounter: Payer: Self-pay | Admitting: Gastroenterology

## 2016-07-18 VITALS — BP 138/86 | HR 65 | Temp 98.6°F | Resp 9 | Ht 66.0 in | Wt 195.0 lb

## 2016-07-18 DIAGNOSIS — Z1212 Encounter for screening for malignant neoplasm of rectum: Secondary | ICD-10-CM | POA: Diagnosis not present

## 2016-07-18 DIAGNOSIS — K635 Polyp of colon: Secondary | ICD-10-CM

## 2016-07-18 DIAGNOSIS — Z1211 Encounter for screening for malignant neoplasm of colon: Secondary | ICD-10-CM

## 2016-07-18 DIAGNOSIS — D125 Benign neoplasm of sigmoid colon: Secondary | ICD-10-CM | POA: Diagnosis not present

## 2016-07-18 MED ORDER — SODIUM CHLORIDE 0.9 % IV SOLN
4.0000 mg | Freq: Once | INTRAVENOUS | Status: AC
  Administered 2016-07-18: 4 mg via INTRAVENOUS

## 2016-07-18 MED ORDER — SODIUM CHLORIDE 0.9 % IV SOLN: 500.0000 mL | INTRAVENOUS | Status: DC

## 2016-07-18 NOTE — Patient Instructions (Signed)
Colon polyp removed today. Handout given on polyps,diverticulosis, hemorrhoids.  Please do not take any ibuprofen, naproxen, aleve, or any other non-steriodal anti-inflammatory medications for 2 weeks.  Call us with any questions or concerns. Thank you!   YOU HAD AN ENDOSCOPIC PROCEDURE TODAY AT [facility]:   Refer to the procedure report that was given to you for any specific questions about what was found during the examination.  If the procedure report does not answer your questions, please call your gastroenterologist to clarify.  If you requested that your care partner not be given the details of your procedure findings, then the procedure report has been included in a sealed envelope for you to review at your convenience later.  YOU SHOULD EXPECT: Some feelings of bloating in the abdomen. Passage of more gas than usual.  Walking can help get rid of the air that was put into your GI tract during the procedure and reduce the bloating. If you had a lower endoscopy (such as a colonoscopy or flexible sigmoidoscopy) you may notice spotting of blood in your stool or on the toilet paper. If you underwent a bowel prep for your procedure, you may not have a normal bowel movement for a few days.  Please Note:  You might notice some irritation and congestion in your nose or some drainage.  This is from the oxygen used during your procedure.  There is no need for concern and it should clear up in a day or so.  SYMPTOMS TO REPORT IMMEDIATELY:   Following lower endoscopy (colonoscopy or flexible sigmoidoscopy):  Excessive amounts of blood in the stool  Significant tenderness or worsening of abdominal pains  Swelling of the abdomen that is new, acute  Fever of 100F or higher   For urgent or emergent issues, a gastroenterologist can be reached at any hour by calling [phone].   DIET:  We do recommend a small meal at first, but then you may proceed to your regular diet.  Drink  plenty of fluids but you should avoid alcoholic beverages for 24 hours.  ACTIVITY:  You should plan to take it easy for the rest of today and you should NOT DRIVE or use heavy machinery until tomorrow (because of the sedation medicines used during the test).    FOLLOW UP: Our staff will call the number listed on your records the next business day following your procedure to check on you and address any questions or concerns that you may have regarding the information given to you following your procedure. If we do not reach you, we will leave a message.  However, if you are feeling well and you are not experiencing any problems, there is no need to return our call.  We will assume that you have returned to your regular daily activities without incident.  If any biopsies were taken you will be contacted by phone or by letter within the next 1-3 weeks.  Please call us at [phone] if you have not heard about the biopsies in 3 weeks.    SIGNATURES/CONFIDENTIALITY: You and/or your care partner have signed paperwork which will be entered into your electronic medical record.  These signatures attest to the fact that that the information above on your After Visit Summary has been reviewed and is understood.  Full responsibility of the confidentiality of this discharge information lies with you and/or your care-partner.

## 2016-07-18 NOTE — Progress Notes (Signed)
Patient nauseated after starting IV spoke with Dr. Havery Moros and verbal order given to Zofran 4mg  IV. 10 minutes after dosage given patient reports feeling better. SM

## 2016-07-18 NOTE — Progress Notes (Signed)
To PACU VSS Report to RN 

## 2016-07-18 NOTE — Progress Notes (Signed)
Called to room to assist during endoscopic procedure.  Patient ID and intended procedure confirmed with present staff. Received instructions for my participation in the procedure from the performing physician.  

## 2016-07-18 NOTE — Progress Notes (Signed)
Pt's states no medical or surgical changes since previsit or office visit. 

## 2016-07-18 NOTE — Op Note (Signed)
[facility] Patient Name: Sandy Green Procedure Date: 07/18/2016 10:21 AM MRN: [id number] Endoscopist: Remo Lipps P. Cherylyn Sundby MD, MD Age: 56 Referring MD:  Date of Birth: [birth year] Gender: Female Account #: [id number] Procedure:                Colonoscopy Indications:              Screening for colorectal malignant neoplasm, This                            is the patient's first colonoscopy Medicines:                Monitored Anesthesia Care Procedure:                Pre-Anesthesia Assessment:                           - Prior to the procedure, a History and Physical                            was performed, and patient medications and                            allergies were reviewed. The patient's tolerance of                            previous anesthesia was also reviewed. The risks                            and benefits of the procedure and the sedation                            options and risks were discussed with the patient.                            All questions were answered, and informed consent                            was obtained. Prior Anticoagulants: The patient has                            taken no previous anticoagulant or antiplatelet                            agents. ASA Grade Assessment: II - A patient with                            mild systemic disease. After reviewing the risks                            and benefits, the patient was deemed in                            satisfactory condition to undergo the procedure.  After obtaining informed consent, the colonoscope                            was passed under direct vision. Throughout the                            procedure, the patient's blood pressure, pulse, and                            oxygen saturations were monitored continuously. The                            Colonoscope was introduced through the anus and                            advanced to the the  cecum, identified by                            appendiceal orifice and ileocecal valve. The                            colonoscopy was performed without difficulty. The                            patient tolerated the procedure well. The quality                            of the bowel preparation was good. The ileocecal                            valve, appendiceal orifice, and rectum were                            photographed. Scope In: 10:27:51 AM Scope Out: 10:41:46 AM Scope Withdrawal Time: 0 hours 11 minutes 37 seconds  Total Procedure Duration: 0 hours 13 minutes 55 seconds  Findings:                 The perianal and digital rectal examinations were                            normal.                           A 5 mm polyp was found in the sigmoid colon. The                            polyp was semi-pedunculated. The polyp was removed                            with a hot snare. Resection and retrieval were                            complete.  Many medium-mouthed diverticula were found in the                            entire colon.                           Internal hemorrhoids were found during                            retroflexion. The hemorrhoids were small.                           The exam was otherwise without abnormality. Complications:            No immediate complications. Estimated blood loss:                            Minimal. Estimated Blood Loss:     Estimated blood loss was minimal. Impression:               - One 5 mm polyp in the sigmoid colon, removed with                            a hot snare. Resected and retrieved.                           - Diverticulosis in the entire examined colon.                           - Internal hemorrhoids.                           - The examination was otherwise normal. Recommendation:           - Patient has a contact number available for                            emergencies. The signs and  symptoms of potential                            delayed complications were discussed with the                            patient. Return to normal activities tomorrow.                            Written discharge instructions were provided to the                            patient.                           - Resume previous diet.                           - Continue present medications.                           -  Await pathology results.                           - Repeat colonoscopy is recommended for                            surveillance. The colonoscopy date will be                            determined after pathology results from today's                            exam become available for review.                           - No ibuprofen, naproxen, or other non-steroidal                            anti-inflammatory drugs for 2 weeks after polyp                            removal. Remo Lipps P. Mansur Patti MD, MD 07/18/2016 10:45:13 AM This report has been signed electronically.

## 2016-07-19 ENCOUNTER — Telehealth: Payer: Self-pay

## 2016-07-19 NOTE — Telephone Encounter (Signed)
  Follow up Call-  Call back number 07/18/2016  Post procedure Call Back phone  # [phone]  Permission to leave phone message Yes  Some recent data might be hidden     Patient questions:  Do you have a fever, pain , or abdominal swelling? No. Pain Score  0 *  Have you tolerated food without any problems? Yes.    Have you been able to return to your normal activities? Yes.    Do you have any questions about your discharge instructions: Diet   No. Medications  No. Follow up visit  No.  Do you have questions or concerns about your Care? No.  Actions: * If pain score is 4 or above: No action needed, pain <4.

## 2016-07-22 ENCOUNTER — Encounter: Payer: Self-pay | Admitting: Gastroenterology

## 2016-08-22 ENCOUNTER — Other Ambulatory Visit: Payer: Self-pay | Admitting: Family Medicine

## 2016-08-22 MED ORDER — BECLOMETHASONE DIPROP HFA 80 MCG/ACT IN AERB: 2.0000 | INHALATION_SPRAY | Freq: Two times a day (BID) | RESPIRATORY_TRACT | 2 refills | Status: DC

## 2016-09-28 ENCOUNTER — Telehealth: Payer: Self-pay | Admitting: Family Medicine

## 2016-09-28 NOTE — Telephone Encounter (Signed)
Patient is not having any symptoms but would like testing done.  She will be in tomorrow to be seen.

## 2016-09-28 NOTE — Telephone Encounter (Signed)
Caller name:Kelilah Hnat Relationship to patient: Can be [phone] Pharmacy:  Reason for call:found tick on her this morning, did save the tick. Does she need to come in for anything?

## 2016-09-29 ENCOUNTER — Encounter: Payer: Self-pay | Admitting: Family Medicine

## 2016-09-29 ENCOUNTER — Ambulatory Visit (INDEPENDENT_AMBULATORY_CARE_PROVIDER_SITE_OTHER): Payer: Managed Care, Other (non HMO) | Admitting: Family Medicine

## 2016-09-29 VITALS — BP 136/88 | HR 78 | Temp 97.9°F | Resp 16 | Ht 66.0 in | Wt 191.2 lb

## 2016-09-29 DIAGNOSIS — W57XXXA Bitten or stung by nonvenomous insect and other nonvenomous arthropods, initial encounter: Secondary | ICD-10-CM | POA: Diagnosis not present

## 2016-09-29 DIAGNOSIS — S40262A Insect bite (nonvenomous) of left shoulder, initial encounter: Secondary | ICD-10-CM

## 2016-09-29 NOTE — Patient Instructions (Signed)
Tick Bite Information Introduction Ticks are insects that attach themselves to the skin. There are many types of ticks. Common types include wood ticks and deer ticks. Sometimes, ticks carry diseases that can make a person very ill. The most common places for ticks to attach themselves are the scalp, neck, armpits, waist, and groin. HOW CAN YOU PREVENT TICK BITES? Take these steps to help prevent tick bites when you are outdoors:  Wear long sleeves and long pants.  Wear white clothes so you can see ticks more easily.  Tuck your pant legs into your socks.  If walking on a trail, stay in the middle of the trail to avoid brushing against bushes.  Avoid walking through areas with long grass.  Put bug spray on all skin that is showing and along boot tops, pant legs, and sleeve cuffs.  Check clothes, hair, and skin often and before going inside.  Brush off any ticks that are not attached.  Take a shower or bath as soon as possible after being outdoors.  HOW SHOULD YOU REMOVE A TICK? Ticks should be removed as soon as possible to help prevent diseases. 1. If latex gloves are available, put them on before trying to remove a tick. 2. Use tweezers to grasp the tick as close to the skin as possible. You may also use curved forceps or a tick removal tool. Grasp the tick as close to its head as possible. Avoid grasping the tick on its body. 3. Pull gently upward until the tick lets go. Do not twist the tick or jerk it suddenly. This may break off the tick's head or mouth parts. 4. Do not squeeze or crush the tick's body. This could force disease-carrying fluids from the tick into your body. 5. After the tick is removed, wash the bite area and your hands with soap and water or alcohol. 6. Apply a small amount of antiseptic cream or ointment to the bite site. 7. Wash any tools that were used.  Do not try to remove a tick by applying a hot match, petroleum jelly, or fingernail polish to the tick.  These methods do not work. They may also increase the chances of disease being spread from the tick bite. WHEN SHOULD YOU SEEK HELP? Contact your health care provider if you are unable to remove a tick or if a part of the tick breaks off in the skin. After a tick bite, you need to watch for signs and symptoms of diseases that can be spread by ticks. Contact your health care provider if you develop any of the following:  Fever.  Rash.  Redness and puffiness (swelling) in the area of the tick bite.  Tender, puffy lymph glands.  Watery poop (diarrhea).  Weight loss.  Cough.  Feeling more tired than normal (fatigue).  Muscle, joint, or bone pain.  Belly (abdominal) pain.  Headache.  Change in your level of consciousness.  Trouble walking or moving your legs.  Loss of feeling (numbness) in the legs.  Loss of movement (paralysis).  Shortness of breath.  Confusion.  Throwing up (vomiting) many times.  This information is not intended to replace advice given to you by your health care provider. Make sure you discuss any questions you have with your health care provider. Document Released: 06/15/2009 Document Revised: 08/27/2015 Document Reviewed: 08/29/2012 Elsevier Interactive Patient Education  2018 Elsevier Inc.  

## 2016-09-29 NOTE — Progress Notes (Signed)
Patient ID: Sandy Green, female   DOB: [birth year], 56 y.o.   MRN: [id number]     Subjective:  I acted as a Education administrator for Dr. Carollee Herter.  Sandy Green, Sandy Green   Patient ID: Sandy Green, female    DOB: Jun 24, 1960, 56 y.o.   MRN: [id number]  Chief Complaint  Patient presents with  . Tick Removal    on back upper left side, removed yesterday morning.    HPI Patient is in today for tick bite on left upper back.  She removed tick yesterday morning.  She denies any fever, bodyaches, or rashes.   Past Medical History:  Diagnosis Date  . Allergy   . Ankle fracture, left   . Anxiety   . Asthma   . Chicken pox   . Elbow fracture   . Heart murmur   . Hyperlipemia   . Hypertension     Past Surgical History:  Procedure Laterality Date  . ELBOW FRACTURE SURGERY Left   . WISDOM TOOTH EXTRACTION      Family History  Problem Relation Age of Onset  . Hypertension Mother   . Hypertension Father   . Heart disease Father        cabg.[id number]  . Diabetes Maternal Grandmother   . Cancer Maternal Grandmother        Unsure of kind? Breast  . Colon cancer Neg Hx   . Esophageal cancer Neg Hx   . Rectal cancer Neg Hx   . Stomach cancer Neg Hx     Social History   Social History  . Marital status: Married    Spouse name: N/A  . Number of children: N/A  . Years of education: N/A   Occupational History  . Not on file.   Social History Main Topics  . Smoking status: Never Smoker  . Smokeless tobacco: Never Used  . Alcohol use 0.0 oz/week     Comment: Rare  . Drug use: No  . Sexual activity: Yes    Partners: Male   Other Topics Concern  . Not on file   Social History Narrative   Exercise-- no    Married,lives with husband.   Unemployed.    Outpatient Medications Prior to Visit  Medication Sig Dispense Refill  . albuterol (PROVENTIL HFA;VENTOLIN HFA) 108 (90 BASE) MCG/ACT inhaler Inhale 1-2 puffs into the lungs every 6 (six) hours as needed for wheezing or shortness of breath. 1 Inhaler 0   . atorvastatin (LIPITOR) 20 MG tablet Take 1 tablet (20 mg total) by mouth daily. 90 tablet 1  . Beclomethasone Diprop HFA (QVAR REDIHALER) 80 MCG/ACT AERB Inhale 2 puffs into the lungs 2 (two) times daily. 3 Inhaler 2  . escitalopram (LEXAPRO) 10 MG tablet TAKE 1 TABLET (10 MG TOTAL) BY MOUTH DAILY. 90 tablet 1  . Levocetirizine Dihydrochloride (XYZAL ALLERGY 24HR PO) Take 1 tablet by mouth daily.    . montelukast (SINGULAIR) 10 MG tablet TAKE 1 TABLET BY MOUTH AT BEDTIME 90 tablet 1  . norethindrone-ethinyl estradiol-iron (MICROGESTIN FE,GILDESS FE,LOESTRIN FE) 1.5-30 MG-MCG tablet Take 1 tablet by mouth daily. 3 Package 4  . valsartan (DIOVAN) 160 MG tablet Take 1 tablet (160 mg total) by mouth daily. 90 tablet 1   Facility-Administered Medications Prior to Visit  Medication Dose Route Frequency Provider Last Rate Last Dose  . 0.9 %  sodium chloride infusion  500 mL Intravenous Continuous Armbruster, Renelda Loma, MD        Allergies  Allergen Reactions  . Penicillins Rash  and Other (See Comments)    Reaction unknown  . Tetracyclines & Related Nausea Only and Rash    GI upset    Review of Systems  Constitutional: Negative for fever and malaise/fatigue.  HENT: Negative for congestion.   Eyes: Negative for blurred vision.  Respiratory: Negative for cough and shortness of breath.   Cardiovascular: Negative for chest pain, palpitations and leg swelling.  Gastrointestinal: Negative for vomiting.  Musculoskeletal: Negative for back pain.  Skin: Negative for rash.  Neurological: Negative for loss of consciousness and headaches.       Objective:    Physical Exam  Constitutional: She is oriented to person, place, and time. She appears well-developed and well-nourished.  HENT:  Head: Normocephalic and atraumatic.  Eyes: Conjunctivae and EOM are normal.  Neck: Normal range of motion. Neck supple. No JVD present. Carotid bruit is not present. No thyromegaly present.  Cardiovascular:  Normal rate, regular rhythm and normal heart sounds.   No murmur heard. Pulmonary/Chest: Effort normal and breath sounds normal. No respiratory distress. She has no wheezes. She has no rales. She exhibits no tenderness.  Musculoskeletal: She exhibits no edema.  Neurological: She is alert and oriented to person, place, and time.  Psychiatric: She has a normal mood and affect.  Nursing note and vitals reviewed.   BP 136/88 (BP Location: Right Arm, Cuff Size: Normal)   Pulse 78   Temp 97.9 F (36.6 C) (Oral)   Resp 16   Ht 5\' 6"  (1.676 m)   Wt 191 lb 3.2 oz (86.7 kg)   SpO2 98%   BMI 30.86 kg/m  Wt Readings from Last 3 Encounters:  09/29/16 191 lb 3.2 oz (86.7 kg)  07/18/16 195 lb (88.5 kg)  07/04/16 195 lb 9.6 oz (88.7 kg)     Lab Results  Component Value Date   WBC 9.5 04/29/2016   HGB 13.5 04/29/2016   HCT 41.3 04/29/2016   PLT 369 04/29/2016   GLUCOSE 86 04/29/2016   CHOL 175 04/29/2016   TRIG 90 04/29/2016   HDL 55 04/29/2016   LDLCALC 102 (H) 04/29/2016   ALT 15 04/29/2016   AST 18 04/29/2016   NA 140 04/29/2016   K 4.2 04/29/2016   CL 107 04/29/2016   CREATININE 1.13 (H) 04/29/2016   BUN 21 04/29/2016   CO2 24 04/29/2016   TSH 1.15 04/29/2016    Lab Results  Component Value Date   TSH 1.15 04/29/2016   Lab Results  Component Value Date   WBC 9.5 04/29/2016   HGB 13.5 04/29/2016   HCT 41.3 04/29/2016   MCV 90.4 04/29/2016   PLT 369 04/29/2016   Lab Results  Component Value Date   NA 140 04/29/2016   K 4.2 04/29/2016   CO2 24 04/29/2016   GLUCOSE 86 04/29/2016   BUN 21 04/29/2016   CREATININE 1.13 (H) 04/29/2016   BILITOT 0.7 04/29/2016   ALKPHOS 68 04/29/2016   AST 18 04/29/2016   ALT 15 04/29/2016   PROT 6.8 04/29/2016   ALBUMIN 3.4 (L) 04/29/2016   CALCIUM 8.6 04/29/2016   GFR 60.86 09/21/2015   Lab Results  Component Value Date   CHOL 175 04/29/2016   Lab Results  Component Value Date   HDL 55 04/29/2016   Lab Results    Component Value Date   LDLCALC 102 (H) 04/29/2016   Lab Results  Component Value Date   TRIG 90 04/29/2016   Lab Results  Component Value Date   CHOLHDL  3.2 04/29/2016   No results found for: HGBA1C     Assessment & Plan:   Problem List Items Addressed This Visit    None    Visit Diagnoses    Tick bite of left shoulder, initial encounter    -  Primary   Relevant Orders   Lyme Winferd Humphrey, Wstrn. Blt. IgG & IgM w/bands   Rocky mtn spotted fvr abs pnl(IgG+IgM)   CBC with Differential/Platelet   Comprehensive metabolic panel    if any symptoms develop or rash around site etc call us  F/u prn  I am having Ms. Marchant maintain her albuterol, valsartan, escitalopram, atorvastatin, montelukast, norethindrone-ethinyl estradiol-iron, Levocetirizine Dihydrochloride (XYZAL ALLERGY 24HR PO), and beclomethasone. We will continue to administer sodium chloride.  No orders of the defined types were placed in this encounter.   CMA served as Education administrator during this visit. History, Physical and Plan performed by medical provider. Documentation and orders reviewed and attested to.  Ann Held, DO

## 2016-09-30 LAB — COMPREHENSIVE METABOLIC PANEL
ALBUMIN: 3.4 g/dL — AB (ref 3.5–5.2)
ALK PHOS: 57 U/L (ref 39–117)
ALT: 11 U/L (ref 0–35)
AST: 18 U/L (ref 0–37)
BILIRUBIN TOTAL: 0.7 mg/dL (ref 0.2–1.2)
BUN: 14 mg/dL (ref 6–23)
CO2: 27 mEq/L (ref 19–32)
CREATININE: 1.11 mg/dL (ref 0.40–1.20)
Calcium: 8.7 mg/dL (ref 8.4–10.5)
Chloride: 108 mEq/L (ref 96–112)
GFR: 54.37 mL/min — ABNORMAL LOW (ref >60.00)
GLUCOSE: 93 mg/dL (ref 70–99)
Potassium: 4 mEq/L (ref 3.5–5.1)
SODIUM: 142 meq/L (ref 135–145)
TOTAL PROTEIN: 6.7 g/dL (ref 6.0–8.3)

## 2016-09-30 LAB — CBC WITH DIFFERENTIAL/PLATELET
BASOS ABS: 0.1 10*3/uL (ref 0.0–0.1)
Basophils Relative: 1.2 % (ref 0.0–3.0)
EOS PCT: 2.8 % (ref 0.0–5.0)
Eosinophils Absolute: 0.2 10*3/uL (ref 0.0–0.7)
HCT: 39.8 % (ref 36.0–46.0)
HEMOGLOBIN: 13.3 g/dL (ref 12.0–15.0)
LYMPHS ABS: 2.2 10*3/uL (ref 0.7–4.0)
Lymphocytes Relative: 27.3 % (ref 12.0–46.0)
MCHC: 33.4 g/dL (ref 30.0–36.0)
MCV: 90.5 fl (ref 78.0–100.0)
MONO ABS: 0.9 10*3/uL (ref 0.1–1.0)
MONOS PCT: 10.4 % (ref 3.0–12.0)
NEUTROS PCT: 58.3 % (ref 43.0–77.0)
Neutro Abs: 4.8 10*3/uL (ref 1.4–7.7)
Platelets: 266 10*3/uL (ref 150.0–400.0)
RBC: 4.39 Mil/uL (ref 3.87–5.11)
RDW: 13.6 % (ref 11.5–15.5)
WBC: 8.2 10*3/uL (ref 4.0–10.5)

## 2016-09-30 LAB — ROCKY MTN SPOTTED FVR ABS PNL(IGG+IGM)
RMSF IGG: NOT DETECTED
RMSF IGM: NOT DETECTED

## 2016-10-03 ENCOUNTER — Telehealth: Payer: Self-pay | Admitting: Family Medicine

## 2016-10-03 NOTE — Telephone Encounter (Signed)
If the diarrhea is new it is likely viral gastroenteritis and she needs to drink only clear fluids with gatorade/pedialyte, ginger ale, water etc for next 24 hours then progress to [employer] (bananas, rice, applesauce and toast) for next 24 hours to let the gut rest. Then progress diet as tolerated. If new symptoms develop or symptoms do not resolve let us know for further evaluation

## 2016-10-03 NOTE — Telephone Encounter (Signed)
Spoke with patient she states her nausea is a little better since this morning.  She had several instances of diarrhea in a one hour period.  She denies any bodyaches and fever.  Labs for lyme are still pending.  RMSF not dectected.  What can we recommend as next step for her.  She was seen last week.  At that time she was asymptomatic.

## 2016-10-03 NOTE — Telephone Encounter (Signed)
Patient notified

## 2016-10-03 NOTE — Telephone Encounter (Signed)
Caller name: Cheryal Salas Relationship to patient: self Can be reached: [phone]  Reason for call: Pt now having sx of tick bite. She is having severe diarrhea since 4am. She has taken 3 immodium since this morning. She was very nauseous when taking a shower. Pt is concerned with what to do. Dr. Etter Sjogren advised to call with having sx. Please call her back.

## 2016-10-04 LAB — LYME ABY, WSTRN BLT IGG & IGM W/BANDS
B burgdorferi IgG Abs (IB): NEGATIVE
B burgdorferi IgM Abs (IB): NEGATIVE
LYME DISEASE 23 KD IGG: NONREACTIVE
LYME DISEASE 23 KD IGM: NONREACTIVE
LYME DISEASE 28 KD IGG: NONREACTIVE
LYME DISEASE 39 KD IGM: NONREACTIVE
LYME DISEASE 41 KD IGG: NONREACTIVE
LYME DISEASE 41 KD IGM: NONREACTIVE
LYME DISEASE 45 KD IGG: NONREACTIVE
Lyme Disease 18 kD IgG: NONREACTIVE
Lyme Disease 30 kD IgG: NONREACTIVE
Lyme Disease 39 kD IgG: NONREACTIVE
Lyme Disease 58 kD IgG: NONREACTIVE
Lyme Disease 66 kD IgG: NONREACTIVE
Lyme Disease 93 kD IgG: NONREACTIVE

## 2016-10-10 ENCOUNTER — Encounter: Payer: Self-pay | Admitting: Family Medicine

## 2016-10-10 DIAGNOSIS — I10 Essential (primary) hypertension: Secondary | ICD-10-CM

## 2016-10-11 ENCOUNTER — Telehealth: Payer: Self-pay | Admitting: Family Medicine

## 2016-10-11 MED ORDER — VALSARTAN 160 MG PO TABS
160.0000 mg | ORAL_TABLET | Freq: Every day | ORAL | 1 refills | Status: DC
Start: 2016-10-11 — End: 2016-10-11

## 2016-10-11 MED ORDER — VALSARTAN 80 MG PO TABS: ORAL_TABLET | ORAL | 2 refills | Status: DC

## 2016-10-11 NOTE — Telephone Encounter (Signed)
Valsartan 160 is on back order/pharmacy does have the 80'ok to change to valsartan 80 mg bid??

## 2016-10-11 NOTE — Telephone Encounter (Signed)
[facility] for 80 2 a day not bid

## 2016-10-11 NOTE — Telephone Encounter (Signed)
Updated list and sent in valsartan 80 as instructed Notified patient and sent just a 30 day supply to [facility]

## 2016-11-01 ENCOUNTER — Ambulatory Visit (INDEPENDENT_AMBULATORY_CARE_PROVIDER_SITE_OTHER): Payer: Managed Care, Other (non HMO) | Admitting: Family Medicine

## 2016-11-01 ENCOUNTER — Encounter: Payer: Self-pay | Admitting: Family Medicine

## 2016-11-01 DIAGNOSIS — E785 Hyperlipidemia, unspecified: Secondary | ICD-10-CM | POA: Diagnosis not present

## 2016-11-01 DIAGNOSIS — I10 Essential (primary) hypertension: Secondary | ICD-10-CM

## 2016-11-01 LAB — COMPREHENSIVE METABOLIC PANEL
ALK PHOS: 62 U/L (ref 39–117)
ALT: 14 U/L (ref 0–35)
AST: 17 U/L (ref 0–37)
Albumin: 3.5 g/dL (ref 3.5–5.2)
BILIRUBIN TOTAL: 1.3 mg/dL — AB (ref 0.2–1.2)
BUN: 17 mg/dL (ref 6–23)
CO2: 28 meq/L (ref 19–32)
Calcium: 8.5 mg/dL (ref 8.4–10.5)
Chloride: 105 mEq/L (ref 96–112)
Creatinine, Ser: 1.01 mg/dL (ref 0.40–1.20)
GFR: 60.61 mL/min (ref >60.00)
GLUCOSE: 93 mg/dL (ref 70–99)
Potassium: 3.4 mEq/L — ABNORMAL LOW (ref 3.5–5.1)
SODIUM: 139 meq/L (ref 135–145)
TOTAL PROTEIN: 6.8 g/dL (ref 6.0–8.3)

## 2016-11-01 LAB — LIPID PANEL
CHOL/HDL RATIO: 3
Cholesterol: 171 mg/dL (ref 0–200)
HDL: 52.3 mg/dL (ref >39.00)
LDL Cholesterol: 100 mg/dL — ABNORMAL HIGH (ref 0–99)
NONHDL: 119.11
Triglycerides: 94 mg/dL (ref 0.0–149.0)
VLDL: 18.8 mg/dL (ref 0.0–40.0)

## 2016-11-01 MED ORDER — OLMESARTAN MEDOXOMIL 40 MG PO TABS: 40.0000 mg | ORAL_TABLET | Freq: Every day | ORAL | 5 refills | Status: DC

## 2016-11-01 NOTE — Progress Notes (Signed)
Patient ID: Sandy Green, female    DOB: May 09, 1960  Age: 56 y.o. MRN: [id number]    Subjective:  Subjective  HPI Sandy Green presents for f/u bp and cholesterol.  No compliants.    Review of Systems  Constitutional: Negative for appetite change, diaphoresis, fatigue and unexpected weight change.  Eyes: Negative for pain, redness and visual disturbance.  Respiratory: Negative for cough, chest tightness, shortness of breath and wheezing.   Cardiovascular: Negative for chest pain, palpitations and leg swelling.  Endocrine: Negative for cold intolerance, heat intolerance, polydipsia, polyphagia and polyuria.  Genitourinary: Negative for difficulty urinating, dysuria and frequency.  Neurological: Negative for dizziness, light-headedness, numbness and headaches.    History Past Medical History:  Diagnosis Date  . Allergy   . Ankle fracture, left   . Anxiety   . Asthma   . Chicken pox   . Elbow fracture   . Heart murmur   . Hyperlipemia   . Hypertension     She has a past surgical history that includes Elbow fracture surgery (Left) and Wisdom tooth extraction.   Her family history includes Cancer in her maternal grandmother; Diabetes in her maternal grandmother; Heart disease in her father; Hypertension in her father and mother.She reports that she has never smoked. She has never used smokeless tobacco. She reports that she drinks alcohol. She reports that she does not use drugs.  Current Outpatient Prescriptions on File Prior to Visit  Medication Sig Dispense Refill  . albuterol (PROVENTIL HFA;VENTOLIN HFA) 108 (90 BASE) MCG/ACT inhaler Inhale 1-2 puffs into the lungs every 6 (six) hours as needed for wheezing or shortness of breath. 1 Inhaler 0  . atorvastatin (LIPITOR) 20 MG tablet Take 1 tablet (20 mg total) by mouth daily. 90 tablet 1  . Beclomethasone Diprop HFA (QVAR REDIHALER) 80 MCG/ACT AERB Inhale 2 puffs into the lungs 2 (two) times daily. 3 Inhaler 2  . escitalopram  (LEXAPRO) 10 MG tablet TAKE 1 TABLET (10 MG TOTAL) BY MOUTH DAILY. 90 tablet 1  . Levocetirizine Dihydrochloride (XYZAL ALLERGY 24HR PO) Take 1 tablet by mouth daily.    . montelukast (SINGULAIR) 10 MG tablet TAKE 1 TABLET BY MOUTH AT BEDTIME 90 tablet 1  . norethindrone-ethinyl estradiol-iron (MICROGESTIN FE,GILDESS FE,LOESTRIN FE) 1.5-30 MG-MCG tablet Take 1 tablet by mouth daily. 3 Package 4   Current Facility-Administered Medications on File Prior to Visit  Medication Dose Route Frequency Provider Last Rate Last Dose  . 0.9 %  sodium chloride infusion  500 mL Intravenous Continuous Armbruster, Renelda Loma, MD         Objective:  Objective  Physical Exam  Constitutional: She is oriented to person, place, and time. She appears well-developed and well-nourished.  HENT:  Head: Normocephalic and atraumatic.  Eyes: Conjunctivae and EOM are normal.  Neck: Normal range of motion. Neck supple. No JVD present. Carotid bruit is not present. No thyromegaly present.  Cardiovascular: Normal rate, regular rhythm and normal heart sounds.   No murmur heard. Pulmonary/Chest: Effort normal and breath sounds normal. No respiratory distress. She has no wheezes. She has no rales. She exhibits no tenderness.  Musculoskeletal: She exhibits no edema.  Neurological: She is alert and oriented to person, place, and time.  Psychiatric: She has a normal mood and affect.  Nursing note and vitals reviewed.  BP (!) 153/103 (BP Location: Left Arm, Patient Position: Sitting, Cuff Size: Normal)   Pulse 84   Temp 98.6 F (37 C) (Oral)   Ht 5\' 6"  (1.676  m)   Wt 188 lb 6 oz (85.4 kg)   SpO2 95%   BMI 30.40 kg/m  Wt Readings from Last 3 Encounters:  11/01/16 188 lb 6 oz (85.4 kg)  09/29/16 191 lb 3.2 oz (86.7 kg)  07/18/16 195 lb (88.5 kg)     Lab Results  Component Value Date   WBC 8.2 09/29/2016   HGB 13.3 09/29/2016   HCT 39.8 09/29/2016   PLT 266.0 09/29/2016   GLUCOSE 93 09/29/2016   CHOL 175  04/29/2016   TRIG 90 04/29/2016   HDL 55 04/29/2016   LDLCALC 102 (H) 04/29/2016   ALT 11 09/29/2016   AST 18 09/29/2016   NA 142 09/29/2016   K 4.0 09/29/2016   CL 108 09/29/2016   CREATININE 1.11 09/29/2016   BUN 14 09/29/2016   CO2 27 09/29/2016   TSH 1.15 04/29/2016    No results found.   Assessment & Plan:  Plan  I have discontinued Ms. Cantu's valsartan. I am also having her start on olmesartan. Additionally, I am having her maintain her albuterol, escitalopram, atorvastatin, montelukast, norethindrone-ethinyl estradiol-iron, Levocetirizine Dihydrochloride (XYZAL ALLERGY 24HR PO), and beclomethasone. We will continue to administer sodium chloride.  Meds ordered this encounter  Medications  . olmesartan (BENICAR) 40 MG tablet    Sig: Take 1 tablet (40 mg total) by mouth daily.    Dispense:  30 tablet    Refill:  5    Problem List Items Addressed This Visit      Unprioritized   HTN (hypertension)    Poorly controlled will alter medications, encouraged DASH diet, minimize caffeine and obtain adequate sleep. Report concerning symptoms and follow up as directed and as needed      Relevant Medications   olmesartan (BENICAR) 40 MG tablet   Other Relevant Orders   Lipid panel   Comprehensive metabolic panel   Hyperlipidemia    Tolerating statin, encouraged heart healthy diet, avoid trans fats, minimize simple carbs and saturated fats. Increase exercise as tolerated      Relevant Medications   olmesartan (BENICAR) 40 MG tablet   Other Relevant Orders   Lipid panel   Comprehensive metabolic panel      Follow-up: Return in about 2 weeks (around 11/15/2016), or bp check .  Ann Held, DO

## 2016-11-01 NOTE — Patient Instructions (Signed)

## 2016-11-01 NOTE — Assessment & Plan Note (Addendum)
Tolerating statin, encouraged heart healthy diet, avoid trans fats, minimize simple carbs and saturated fats. Increase exercise as tolerated 

## 2016-11-01 NOTE — Assessment & Plan Note (Addendum)
Poorly controlled will alter medications, encouraged DASH diet, minimize caffeine and obtain adequate sleep. Report concerning symptoms and follow up as directed and as needed Due to recall--diovan changed to benicar

## 2016-11-01 NOTE — Progress Notes (Signed)
Pre visit review using our clinic review tool, if applicable. No additional management support is needed unless otherwise documented below in the visit note. 

## 2016-11-09 ENCOUNTER — Other Ambulatory Visit: Payer: Self-pay | Admitting: Family Medicine

## 2016-11-09 DIAGNOSIS — F419 Anxiety disorder, unspecified: Secondary | ICD-10-CM

## 2016-11-09 NOTE — Telephone Encounter (Signed)
Rx sent to pharmacy. LB 

## 2016-11-15 ENCOUNTER — Ambulatory Visit (INDEPENDENT_AMBULATORY_CARE_PROVIDER_SITE_OTHER): Payer: Managed Care, Other (non HMO) | Admitting: Family Medicine

## 2016-11-15 VITALS — BP 146/99 | HR 80

## 2016-11-15 DIAGNOSIS — I10 Essential (primary) hypertension: Secondary | ICD-10-CM | POA: Diagnosis not present

## 2016-11-15 MED ORDER — OLMESARTAN MEDOXOMIL-HCTZ 40-25 MG PO TABS: 1.0000 | ORAL_TABLET | Freq: Every day | ORAL | 5 refills | Status: DC

## 2016-11-15 NOTE — Patient Instructions (Addendum)
Per Dr. Carollee Herter: STOP Benicar. START Olmesartan-Hydrochlorothiazide 40-25 MG (Benicar-HCT) once daily. Follow-up in 3 months with PCP.

## 2016-11-15 NOTE — Progress Notes (Signed)
Pre visit review using our clinic review tool, if applicable. No additional management support is needed unless otherwise documented below in the visit note.  Patient came in office for blood pressure check per OV note 11/01/16. She voices compliance with medication & current regimen. Patient denies headaches, dizziness, lightheadedness & etc. Today's readings were as follow: BP 147/79 P 78 & BP 146/99 P 80.  Per Dr. Carollee Herter: STOP Benicar. START Olmesartan-Hydrochlorothiazide 40-25 MG (Benicar-HCT) once daily. Follow-up in 3 months with PCP.  Patient was made aware of the provider's instructions & verbalized understanding. Next appointment 02/16/17 at 2:15 PM.

## 2017-01-01 ENCOUNTER — Other Ambulatory Visit: Payer: Self-pay | Admitting: Family Medicine

## 2017-01-06 ENCOUNTER — Encounter: Payer: Self-pay | Admitting: Family Medicine

## 2017-01-06 MED ORDER — OLMESARTAN MEDOXOMIL 40 MG PO TABS: 40.0000 mg | ORAL_TABLET | Freq: Every day | ORAL | 1 refills | Status: DC

## 2017-01-06 NOTE — Telephone Encounter (Signed)
Take hctz out and and f/u next week- per Dr. Etter Sjogren.

## 2017-01-06 NOTE — Telephone Encounter (Signed)
Olmesartan 40mg  sent to [facility] pharmacy.

## 2017-02-16 ENCOUNTER — Ambulatory Visit: Payer: Managed Care, Other (non HMO) | Admitting: Family Medicine

## 2017-02-16 ENCOUNTER — Encounter: Payer: Self-pay | Admitting: Family Medicine

## 2017-02-16 VITALS — BP 143/89 | HR 78 | Temp 98.2°F | Resp 16 | Ht 66.0 in | Wt 193.0 lb

## 2017-02-16 DIAGNOSIS — I10 Essential (primary) hypertension: Secondary | ICD-10-CM

## 2017-02-16 MED ORDER — OLMESARTAN MEDOXOMIL 40 MG PO TABS: 40.0000 mg | ORAL_TABLET | Freq: Every day | ORAL | 1 refills | Status: DC

## 2017-02-16 MED ORDER — AMLODIPINE BESYLATE 2.5 MG PO TABS: 2.5000 mg | ORAL_TABLET | Freq: Every day | ORAL | 3 refills | Status: DC

## 2017-02-16 NOTE — Progress Notes (Signed)
Patient ID: Sandy Green, female   DOB: [birth year], 56 y.o.   MRN: [id number]     Subjective:  I acted as a Education administrator for Dr. Carollee Herter.  [name], [place]   Patient ID: Sandy Green, female    DOB: [birth year], 56 y.o.   MRN: [id number]  Chief Complaint  Patient presents with  . Hypertension    HPI  Patient is in today for blood pressure.  She has been under a lot of stress and her blood pressure has been high.  Patient Care Team: Carollee Herter, Alferd Apa, DO as PCP - General (Family Medicine)   Past Medical History:  Diagnosis Date  . Allergy   . Ankle fracture, left   . Anxiety   . Asthma   . Chicken pox   . Elbow fracture   . Heart murmur   . Hyperlipemia   . Hypertension     Past Surgical History:  Procedure Laterality Date  . ELBOW FRACTURE SURGERY Left   . WISDOM TOOTH EXTRACTION      Family History  Problem Relation Age of Onset  . Hypertension Mother   . Hypertension Father   . Heart disease Father        [id number]  . Diabetes Maternal Grandmother   . Cancer Maternal Grandmother        Unsure of kind? Breast  . Colon cancer Neg Hx   . Esophageal cancer Neg Hx   . Rectal cancer Neg Hx   . Stomach cancer Neg Hx     Social History   Socioeconomic History  . Marital status: Married    Spouse name: Not on file  . Number of children: Not on file  . Years of education: Not on file  . Highest education level: Not on file  Social Needs  . Financial resource strain: Not on file  . Food insecurity - worry: Not on file  . Food insecurity - inability: Not on file  . Transportation needs - medical: Not on file  . Transportation needs - non-medical: Not on file  Occupational History  . Not on file  Tobacco Use  . Smoking status: Never Smoker  . Smokeless tobacco: Never Used  Substance and Sexual Activity  . Alcohol use: Yes    Alcohol/week: 0.0 oz    Comment: Rare  . Drug use: No  . Sexual activity: Yes    Partners: Male  Other Topics Concern  . Not on file    Social History Narrative   Exercise-- no    Married,lives with husband.   Unemployed.    Outpatient Medications Prior to Visit  Medication Sig Dispense Refill  . albuterol (PROVENTIL HFA;VENTOLIN HFA) 108 (90 BASE) MCG/ACT inhaler Inhale 1-2 puffs into the lungs every 6 (six) hours as needed for wheezing or shortness of breath. 1 Inhaler 0  . atorvastatin (LIPITOR) 20 MG tablet Take 1 tablet (20 mg total) by mouth daily. 90 tablet 1  . atorvastatin (LIPITOR) 20 MG tablet TAKE 1 TABLET BY MOUTH EVERY DAY 90 tablet 1  . Beclomethasone Diprop HFA (QVAR REDIHALER) 80 MCG/ACT AERB Inhale 2 puffs into the lungs 2 (two) times daily. 3 Inhaler 2  . escitalopram (LEXAPRO) 10 MG tablet TAKE 1 TABLET BY MOUTH EVERY DAY 90 tablet 1  . Levocetirizine Dihydrochloride (XYZAL ALLERGY 24HR PO) Take 1 tablet by mouth daily.    . montelukast (SINGULAIR) 10 MG tablet TAKE 1 TABLET BY MOUTH AT BEDTIME 90 tablet 1  . montelukast (SINGULAIR) 10 MG  tablet TAKE 1 TABLET BY MOUTH AT BEDTIME 90 tablet 1  . norethindrone-ethinyl estradiol-iron (MICROGESTIN FE,GILDESS FE,LOESTRIN FE) 1.5-30 MG-MCG tablet Take 1 tablet by mouth daily. 3 Package 4  . olmesartan (BENICAR) 40 MG tablet Take 1 tablet (40 mg total) by mouth daily. 30 tablet 1   Facility-Administered Medications Prior to Visit  Medication Dose Route Frequency Provider Last Rate Last Dose  . 0.9 %  sodium chloride infusion  500 mL Intravenous Continuous Armbruster, Carlota Raspberry, MD        Allergies  Allergen Reactions  . Penicillins Rash and Other (See Comments)    Reaction unknown  . Tetracyclines & Related Nausea Only and Rash    GI upset    Review of Systems  Constitutional: Negative for fever and malaise/fatigue.  HENT: Negative for congestion.   Eyes: Negative for blurred vision.  Respiratory: Negative for cough and shortness of breath.   Cardiovascular: Negative for chest pain, palpitations and leg swelling.  Gastrointestinal: Negative for  vomiting.  Musculoskeletal: Negative for back pain.  Skin: Negative for rash.  Neurological: Negative for loss of consciousness and headaches.       Objective:    Physical Exam  Constitutional: She is oriented to person, place, and time. She appears well-developed and well-nourished.  HENT:  Head: Normocephalic and atraumatic.  Eyes: Conjunctivae and EOM are normal.  Neck: Normal range of motion. Neck supple. No JVD present. Carotid bruit is not present. No thyromegaly present.  Cardiovascular: Normal rate, regular rhythm and normal heart sounds.  No murmur heard. Pulmonary/Chest: Effort normal and breath sounds normal. No respiratory distress. She has no wheezes. She has no rales. She exhibits no tenderness.  Musculoskeletal: She exhibits no edema.  Neurological: She is alert and oriented to person, place, and time.  Psychiatric: She has a normal mood and affect. Her behavior is normal. Judgment and thought content normal.  Nursing note and vitals reviewed.   BP (!) 143/89   Pulse 78   Temp 98.2 F (36.8 C) (Oral)   Resp 16   Ht 5\' 6"  (1.676 m)   Wt 193 lb (87.5 kg)   SpO2 98%   BMI 31.15 kg/m  Wt Readings from Last 3 Encounters:  02/16/17 193 lb (87.5 kg)  11/01/16 188 lb 6 oz (85.4 kg)  09/29/16 191 lb 3.2 oz (86.7 kg)   BP Readings from Last 3 Encounters:  02/16/17 (!) 143/89  11/15/16 (!) 146/99  11/01/16 (!) 153/103     Immunization History  Administered Date(s) Administered  . Influenza, Seasonal, Injecte, Preservative Fre 12/12/2014  . Influenza,inj,Quad PF,6+ Mos 01/30/2013, 01/11/2014  . Influenza-Unspecified 12/26/2015  . Tdap 02/28/2008, 07/14/2016    Health Maintenance  Topic Date Due  . HIV Screening  05/22/1977  . MAMMOGRAM  05/27/2017  . PAP SMEAR  03/22/2018  . COLONOSCOPY  07/18/2021  . TETANUS/TDAP  07/15/2026  . INFLUENZA VACCINE  Completed  . Hepatitis C Screening  Completed    Lab Results  Component Value Date   WBC 8.2 09/29/2016    HGB 13.3 09/29/2016   HCT 39.8 09/29/2016   PLT 266.0 09/29/2016   GLUCOSE 93 11/01/2016   CHOL 171 11/01/2016   TRIG 94.0 11/01/2016   HDL 52.30 11/01/2016   LDLCALC 100 (H) 11/01/2016   ALT 14 11/01/2016   AST 17 11/01/2016   NA 139 11/01/2016   K 3.4 (L) 11/01/2016   CL 105 11/01/2016   CREATININE 1.01 11/01/2016   BUN 17 11/01/2016  CO2 28 11/01/2016   TSH 1.15 04/29/2016    Lab Results  Component Value Date   TSH 1.15 04/29/2016   Lab Results  Component Value Date   WBC 8.2 09/29/2016   HGB 13.3 09/29/2016   HCT 39.8 09/29/2016   MCV 90.5 09/29/2016   PLT 266.0 09/29/2016   Lab Results  Component Value Date   NA 139 11/01/2016   K 3.4 (L) 11/01/2016   CO2 28 11/01/2016   GLUCOSE 93 11/01/2016   BUN 17 11/01/2016   CREATININE 1.01 11/01/2016   BILITOT 1.3 (H) 11/01/2016   ALKPHOS 62 11/01/2016   AST 17 11/01/2016   ALT 14 11/01/2016   PROT 6.8 11/01/2016   ALBUMIN 3.5 11/01/2016   CALCIUM 8.5 11/01/2016   GFR 60.61 11/01/2016   Lab Results  Component Value Date   CHOL 171 11/01/2016   Lab Results  Component Value Date   HDL 52.30 11/01/2016   Lab Results  Component Value Date   LDLCALC 100 (H) 11/01/2016   Lab Results  Component Value Date   TRIG 94.0 11/01/2016   Lab Results  Component Value Date   CHOLHDL 3 11/01/2016   No results found for: HGBA1C       Assessment & Plan:   Problem List Items Addressed This Visit      Unprioritized   Essential hypertension - Primary    Poorly controlled will alter medications, encouraged DASH diet, minimize caffeine and obtain adequate sleep. Report concerning symptoms and follow up as directed and as needed      Relevant Medications   amLODipine (NORVASC) 2.5 MG tablet   olmesartan (BENICAR) 40 MG tablet      I have changed Annistyn Barasch's olmesartan. I am also having her start on amLODipine. Additionally, I am having her maintain her albuterol, atorvastatin, montelukast,  norethindrone-ethinyl estradiol-iron, Levocetirizine Dihydrochloride (XYZAL ALLERGY 24HR PO), beclomethasone, escitalopram, montelukast, and atorvastatin. We will continue to administer sodium chloride.  Meds ordered this encounter  Medications  . amLODipine (NORVASC) 2.5 MG tablet    Sig: Take 1 tablet (2.5 mg total) daily by mouth.    Dispense:  90 tablet    Refill:  3  . olmesartan (BENICAR) 40 MG tablet    Sig: Take 1 tablet (40 mg total) daily by mouth.    Dispense:  90 tablet    Refill:  1    CMA served as Education administrator during this visit. History, Physical and Plan performed by medical provider. Documentation and orders reviewed and attested to.  Ann Held, DO

## 2017-02-16 NOTE — Assessment & Plan Note (Signed)
Poorly controlled will alter medications, encouraged DASH diet, minimize caffeine and obtain adequate sleep. Report concerning symptoms and follow up as directed and as needed 

## 2017-02-16 NOTE — Patient Instructions (Signed)
DASH Eating Plan DASH stands for "Dietary Approaches to Stop Hypertension." The DASH eating plan is a healthy eating plan that has been shown to reduce high blood pressure (hypertension). It may also reduce your risk for type 2 diabetes, heart disease, and stroke. The DASH eating plan may also help with weight loss. What are tips for following this plan? General guidelines  Avoid eating more than 2,300 mg (milligrams) of salt (sodium) a day. If you have hypertension, you may need to reduce your sodium intake to 1,500 mg a day.  Limit alcohol intake to no more than 1 drink a day for nonpregnant women and 2 drinks a day for men. One drink equals 12 oz of beer, 5 oz of wine, or 1 oz of hard liquor.  Work with your health care provider to maintain a healthy body weight or to lose weight. Ask what an ideal weight is for you.  Get at least 30 minutes of exercise that causes your heart to beat faster (aerobic exercise) most days of the week. Activities may include walking, swimming, or biking.  Work with your health care provider or diet and nutrition specialist (dietitian) to adjust your eating plan to your individual calorie needs. Reading food labels  Check food labels for the amount of sodium per serving. Choose foods with less than 5 percent of the Daily Value of sodium. Generally, foods with less than 300 mg of sodium per serving fit into this eating plan.  To find whole grains, look for the word "whole" as the first word in the ingredient list. Shopping  Buy products labeled as "low-sodium" or "no salt added."  Buy fresh foods. Avoid canned foods and premade or frozen meals. Cooking  Avoid adding salt when cooking. Use salt-free seasonings or herbs instead of table salt or sea salt. Check with your health care provider or pharmacist before using salt substitutes.  Do not fry foods. Cook foods using healthy methods such as baking, boiling, grilling, and broiling instead.  Cook with  heart-healthy oils, such as olive, canola, soybean, or sunflower oil. Meal planning   Eat a balanced diet that includes: ? 5 or more servings of fruits and vegetables each day. At each meal, try to fill half of your plate with fruits and vegetables. ? Up to 6-8 servings of whole grains each day. ? Less than 6 oz of lean meat, poultry, or fish each day. A 3-oz serving of meat is about the same size as a deck of cards. One egg equals 1 oz. ? 2 servings of low-fat dairy each day. ? A serving of nuts, seeds, or beans 5 times each week. ? Heart-healthy fats. Healthy fats called Omega-3 fatty acids are found in foods such as flaxseeds and coldwater fish, like sardines, salmon, and mackerel.  Limit how much you eat of the following: ? Canned or prepackaged foods. ? Food that is high in trans fat, such as fried foods. ? Food that is high in saturated fat, such as fatty meat. ? Sweets, desserts, sugary drinks, and other foods with added sugar. ? Full-fat dairy products.  Do not salt foods before eating.  Try to eat at least 2 vegetarian meals each week.  Eat more home-cooked food and less restaurant, buffet, and fast food.  When eating at a restaurant, ask that your food be prepared with less salt or no salt, if possible. What foods are recommended? The items listed may not be a complete list. Talk with your dietitian about what   dietary choices are best for you. Grains Whole-grain or whole-wheat bread. Whole-grain or whole-wheat pasta. Koogler rice. Oatmeal. Quinoa. Bulgur. Whole-grain and low-sodium cereals. Pita bread. Low-fat, low-sodium crackers. Whole-wheat flour tortillas. Vegetables Fresh or frozen vegetables (raw, steamed, roasted, or grilled). Low-sodium or reduced-sodium tomato and vegetable juice. Low-sodium or reduced-sodium tomato sauce and tomato paste. Low-sodium or reduced-sodium canned vegetables. Fruits All fresh, dried, or frozen fruit. Canned fruit in natural juice (without  added sugar). Meat and other protein foods Skinless chicken or turkey. Ground chicken or turkey. Pork with fat trimmed off. Fish and seafood. Egg whites. Dried beans, peas, or lentils. Unsalted nuts, nut butters, and seeds. Unsalted canned beans. Lean cuts of beef with fat trimmed off. Low-sodium, lean deli meat. Dairy Low-fat (1%) or fat-free (skim) milk. Fat-free, low-fat, or reduced-fat cheeses. Nonfat, low-sodium ricotta or cottage cheese. Low-fat or nonfat yogurt. Low-fat, low-sodium cheese. Fats and oils Soft margarine without trans fats. Vegetable oil. Low-fat, reduced-fat, or light mayonnaise and salad dressings (reduced-sodium). Canola, safflower, olive, soybean, and sunflower oils. Avocado. Seasoning and other foods Herbs. Spices. Seasoning mixes without salt. Unsalted popcorn and pretzels. Fat-free sweets. What foods are not recommended? The items listed may not be a complete list. Talk with your dietitian about what dietary choices are best for you. Grains Baked goods made with fat, such as croissants, muffins, or some breads. Dry pasta or rice meal packs. Vegetables Creamed or fried vegetables. Vegetables in a cheese sauce. Regular canned vegetables (not low-sodium or reduced-sodium). Regular canned tomato sauce and paste (not low-sodium or reduced-sodium). Regular tomato and vegetable juice (not low-sodium or reduced-sodium). Pickles. Olives. Fruits Canned fruit in a light or heavy syrup. Fried fruit. Fruit in cream or butter sauce. Meat and other protein foods Fatty cuts of meat. Ribs. Fried meat. Bacon. Sausage. Bologna and other processed lunch meats. Salami. Fatback. Hotdogs. Bratwurst. Salted nuts and seeds. Canned beans with added salt. Canned or smoked fish. Whole eggs or egg yolks. Chicken or turkey with skin. Dairy Whole or 2% milk, cream, and half-and-half. Whole or full-fat cream cheese. Whole-fat or sweetened yogurt. Full-fat cheese. Nondairy creamers. Whipped toppings.  Processed cheese and cheese spreads. Fats and oils Butter. Stick margarine. Lard. Shortening. Ghee. Bacon fat. Tropical oils, such as coconut, palm kernel, or palm oil. Seasoning and other foods Salted popcorn and pretzels. Onion salt, garlic salt, seasoned salt, table salt, and sea salt. Worcestershire sauce. Tartar sauce. Barbecue sauce. Teriyaki sauce. Soy sauce, including reduced-sodium. Steak sauce. Canned and packaged gravies. Fish sauce. Oyster sauce. Cocktail sauce. Horseradish that you find on the shelf. Ketchup. Mustard. Meat flavorings and tenderizers. Bouillon cubes. Hot sauce and Tabasco sauce. Premade or packaged marinades. Premade or packaged taco seasonings. Relishes. Regular salad dressings. Where to find more information:  National Heart, Lung, and Blood Institute: www.nhlbi.nih.gov  American Heart Association: www.heart.org Summary  The DASH eating plan is a healthy eating plan that has been shown to reduce high blood pressure (hypertension). It may also reduce your risk for type 2 diabetes, heart disease, and stroke.  With the DASH eating plan, you should limit salt (sodium) intake to 2,300 mg a day. If you have hypertension, you may need to reduce your sodium intake to 1,500 mg a day.  When on the DASH eating plan, aim to eat more fresh fruits and vegetables, whole grains, lean proteins, low-fat dairy, and heart-healthy fats.  Work with your health care provider or diet and nutrition specialist (dietitian) to adjust your eating plan to your individual   calorie needs. This information is not intended to replace advice given to you by your health care provider. Make sure you discuss any questions you have with your health care provider. Document Released: 03/10/2011 Document Revised: 03/14/2016 Document Reviewed: 03/14/2016 Elsevier Interactive Patient Education  2017 [employer]  

## 2017-03-02 ENCOUNTER — Ambulatory Visit (HOSPITAL_COMMUNITY)
Admission: EM | Admit: 2017-03-02 | Discharge: 2017-03-02 | Disposition: A | Discharge status: Home / Self Care | Payer: Managed Care, Other (non HMO) | Attending: Emergency Medicine | Admitting: Emergency Medicine

## 2017-03-02 ENCOUNTER — Encounter (HOSPITAL_COMMUNITY): Payer: Self-pay | Admitting: Emergency Medicine

## 2017-03-02 ENCOUNTER — Other Ambulatory Visit: Payer: Self-pay

## 2017-03-02 DIAGNOSIS — M62830 Muscle spasm of back: Secondary | ICD-10-CM | POA: Diagnosis not present

## 2017-03-02 MED ORDER — CYCLOBENZAPRINE HCL 10 MG PO TABS: 10.0000 mg | ORAL_TABLET | Freq: Two times a day (BID) | ORAL | 0 refills | Status: DC | PRN

## 2017-03-02 MED ORDER — NAPROXEN 375 MG PO TABS: 375.0000 mg | ORAL_TABLET | Freq: Every day | ORAL | 0 refills | Status: AC

## 2017-03-02 NOTE — Discharge Instructions (Signed)
Light activity as tolerated.  Heat/ice application as needed. Muscle relaxer as needed, at night as may cause drowsiness. If symptoms worsen or do not improve in the next week to return to be seen or to follow up with PCP.

## 2017-03-02 NOTE — ED Triage Notes (Signed)
Pt c/o back spasms since yesterday. Worse with lying down. Pt ambulatory with steady gait.

## 2017-03-02 NOTE — ED Provider Notes (Signed)
[place]    CSN: [id number] Arrival date & time: 03/02/17  4332     History   Chief Complaint Chief Complaint  Patient presents with  . Back Pain    HPI Sandy Green is a 56 y.o. female.   Sandy Green presents with complaints of mid left sided back pain which started 11/25 and 11/26 after she was putting up her christmas tree and decorations, with increased lifting and use of arms. Movements increases the pain, laying flat or sitting increases the pain. Ambulating is tolerable. Pain is improved this morning, rates it 2/10. She states she feels spasm, worse last night while trying to sleep. Denies previous injury. Denies numbness tingling or weakness to her arms or legs. Without loss of bladder or bowel function. She took tylenol yesterday which did not seem to help.    ROS per HPI.       Past Medical History:  Diagnosis Date  . Allergy   . Ankle fracture, left   . Anxiety   . Asthma   . Chicken pox   . Elbow fracture   . Heart murmur   . Hyperlipemia   . Hypertension     Patient Active Problem List   Diagnosis Date Noted  . Left wrist pain 09/21/2015  . Pain of left heel 09/21/2015  . Vaginitis and vulvovaginitis 03/23/2015  . Rash and nonspecific skin eruption 01/01/2015  . Essential hypertension 06/16/2014  . Conjunctivitis 04/09/2014  . Hyperlipidemia 02/28/2013  . Multiple allergies 02/28/2013  . Asthma, mild intermittent 02/28/2013  . Obesity (BMI 30-39.9) 02/27/2013    Past Surgical History:  Procedure Laterality Date  . ELBOW FRACTURE SURGERY Left   . WISDOM TOOTH EXTRACTION      OB History    No data available       Home Medications    Prior to Admission medications   Medication Sig Start Date End Date Taking? Authorizing Provider  albuterol (PROVENTIL HFA;VENTOLIN HFA) 108 (90 BASE) MCG/ACT inhaler Inhale 1-2 puffs into the lungs every 6 (six) hours as needed for wheezing or shortness of breath. 05/03/14  Yes Harden Mo, MD    amLODipine (NORVASC) 2.5 MG tablet Take 1 tablet (2.5 mg total) daily by mouth. 02/16/17  Yes Carollee Herter, Yvonne R, DO  atorvastatin (LIPITOR) 20 MG tablet Take 1 tablet (20 mg total) by mouth daily. 06/03/16  Yes [name] R, DO  atorvastatin (LIPITOR) 20 MG tablet TAKE 1 TABLET BY MOUTH EVERY DAY 01/02/17  Yes [name] R, DO  Beclomethasone Diprop HFA (QVAR REDIHALER) 80 MCG/ACT AERB Inhale 2 puffs into the lungs 2 (two) times daily. 08/22/16  Yes [name] R, DO  escitalopram (LEXAPRO) 10 MG tablet TAKE 1 TABLET BY MOUTH EVERY DAY 11/09/16  Yes Ann Held, DO  Levocetirizine Dihydrochloride (XYZAL ALLERGY 24HR PO) Take 1 tablet by mouth daily.   Yes [provider]  montelukast (SINGULAIR) 10 MG tablet TAKE 1 TABLET BY MOUTH AT BEDTIME 06/03/16  Yes Lowne Chase, Yvonne R, DO  montelukast (SINGULAIR) 10 MG tablet TAKE 1 TABLET BY MOUTH AT BEDTIME 01/02/17  Yes [name] R, DO  norethindrone-ethinyl estradiol-iron (MICROGESTIN FE,GILDESS FE,LOESTRIN FE) 1.5-30 MG-MCG tablet Take 1 tablet by mouth daily. 06/03/16  Yes Ann Held, DO  olmesartan (BENICAR) 40 MG tablet Take 1 tablet (40 mg total) daily by mouth. 02/16/17  Yes Lowne Chase, Yvonne R, DO  cyclobenzaprine (FLEXERIL) 10 MG tablet Take 1  tablet (10 mg total) by mouth 2 (two) times daily as needed for muscle spasms. 03/02/17   Zigmund Gottron, NP  naproxen (NAPROSYN) 375 MG tablet Take 1 tablet (375 mg total) by mouth daily for 5 days. 03/02/17 03/07/17  Zigmund Gottron, NP    Family History Family History  Problem Relation Age of Onset  . Hypertension Mother   . Hypertension Father   . Heart disease Father        cabg.[id number]  . Diabetes Maternal Grandmother   . Cancer Maternal Grandmother        Unsure of kind? Breast  . Colon cancer Neg Hx   . Esophageal cancer Neg Hx   . Rectal cancer Neg Hx   . Stomach cancer Neg Hx     Social History Social History   Tobacco Use   . Smoking status: Never Smoker  . Smokeless tobacco: Never Used  Substance Use Topics  . Alcohol use: Yes    Alcohol/week: 0.0 oz    Comment: Rare  . Drug use: No     Allergies   Penicillins and Tetracyclines & related   Review of Systems Review of Systems   Physical Exam Triage Vital Signs ED Triage Vitals  Enc Vitals Group     BP 03/02/17 1008 (!) 144/94     Pulse Rate 03/02/17 1008 93     Resp 03/02/17 1008 18     Temp 03/02/17 1008 98.1 F (36.7 C)     Temp src --      SpO2 03/02/17 1008 100 %     Weight --      Height --      Head Circumference --      Peak Flow --      Pain Score 03/02/17 1009 2     Pain Loc --      Pain Edu? --      Excl. in [place]? --    No data found.  Updated Vital Signs BP (!) 144/94   Pulse 93   Temp 98.1 F (36.7 C)   Resp 18   SpO2 100%   Visual Acuity Right Eye Distance:   Left Eye Distance:   Bilateral Distance:    Right Eye Near:   Left Eye Near:    Bilateral Near:     Physical Exam  Musculoskeletal:       Thoracic back: She exhibits tenderness, pain and spasm. She exhibits no bony tenderness and normal pulse.       Back:  Pain reproducible with palpation and with left arm across chest; arm strength 5/5 and full ROm noted; sensation intact; distal pulses intact  Vitals reviewed.    UC Treatments / Results  Labs (all labs ordered are listed, but only abnormal results are displayed) Labs Reviewed - No data to display  EKG  EKG Interpretation None       Radiology No results found.  Procedures Procedures (including critical care time)  Medications Ordered in UC Medications - No data to display   Initial Impression / Assessment and Plan / UC Course  I have reviewed the triage vital signs and the nursing notes.  Pertinent labs & imaging results that were available during my care of the patient were reviewed by me and considered in my medical decision making (see chart for details).     Naproxen  once a day for 5 days as she is on benicar. Flexeril twice a day as needed with drowsiness discussed.  Heat and/or ice for comfort. Light activity. If symptoms worsen or do not improve in the next week to return to be seen or to follow up with PCP. Patient verbalized understanding and agreeable to plan.    Final Clinical Impressions(s) / UC Diagnoses   Final diagnoses:  Muscle spasm of back    ED Discharge Orders        Ordered    naproxen (NAPROSYN) 375 MG tablet  Daily     03/02/17 1022    cyclobenzaprine (FLEXERIL) 10 MG tablet  2 times daily PRN     03/02/17 1022       Controlled Substance Prescriptions [place] Controlled Substance Registry consulted? Not Applicable   Zigmund Gottron, NP 03/02/17 1029

## 2017-03-08 ENCOUNTER — Encounter: Payer: Self-pay | Admitting: Family Medicine

## 2017-03-08 ENCOUNTER — Ambulatory Visit (INDEPENDENT_AMBULATORY_CARE_PROVIDER_SITE_OTHER): Payer: Managed Care, Other (non HMO) | Admitting: Family Medicine

## 2017-03-08 DIAGNOSIS — I10 Essential (primary) hypertension: Secondary | ICD-10-CM | POA: Diagnosis not present

## 2017-03-08 NOTE — Progress Notes (Signed)
I have reviewed and agree.

## 2017-03-08 NOTE — Progress Notes (Signed)
Pre visit review using our clinic tool,if applicable. No additional management support is needed unless otherwise documented below in the visit note.   Patient in for BP check per order from Dr. Carollee Herter dated 02/16/2017.  Started patient on Amlodipine 2.5 mg for BP. Patient takes her medication in the pm so she has not had   BP today = 125/86 P=86  Per Dr. Lorelei Pont patient to continue taking medications as ordered and return for regular appointment with Dr. Carollee Herter.

## 2017-03-20 ENCOUNTER — Other Ambulatory Visit: Payer: Self-pay | Admitting: *Deleted

## 2017-03-20 MED ORDER — NORETHIN ACE-ETH ESTRAD-FE 1.5-30 MG-MCG PO TABS: 1.0000 | ORAL_TABLET | Freq: Every day | ORAL | 1 refills | Status: DC

## 2017-03-21 ENCOUNTER — Telehealth: Payer: Self-pay | Admitting: Family Medicine

## 2017-03-21 NOTE — Telephone Encounter (Signed)
Copied from [place] [id number]. Topic: Quick Communication - Rx Refill/Question >> Mar 21, 2017  1:35 PM Arletha Grippe wrote: Has the patient contacted their pharmacy? No.   (Agent: If no, request that the patient contact the pharmacy for the refill.)   Preferred Pharmacy (with phone number [facility] [address] pt would like refill on olmesartan (BENICAR) 40 MG tablet, she would like 90 day supply. Pt said the current bottle had no refills left. cb # [phone]    Agent: Please be advised that RX refills may take up to 3 business days. We ask that you follow-up with your pharmacy.

## 2017-03-21 NOTE — Telephone Encounter (Signed)
Patient notified that Rx should be at pharmacy- please call to fill.

## 2017-05-02 ENCOUNTER — Other Ambulatory Visit: Payer: Self-pay | Admitting: Family Medicine

## 2017-05-02 DIAGNOSIS — F419 Anxiety disorder, unspecified: Secondary | ICD-10-CM

## 2017-06-24 ENCOUNTER — Other Ambulatory Visit: Payer: Self-pay | Admitting: Family Medicine

## 2017-06-26 ENCOUNTER — Telehealth: Payer: Self-pay | Admitting: Family Medicine

## 2017-06-26 NOTE — Telephone Encounter (Signed)
Copied from [place] [id number]. Topic: Quick Communication - See Telephone Encounter >> Jun 26, 2017 10:28 AM Burnis Medin, NT wrote: CRM for notification. See Telephone encounter for: 06/26/17. Patient called and said they are not making olmesartan and wanted to see if the doctor can send another medication to [id number] [place], [place] - [address] [place] [phone] (Phone) [phone] (Fax)

## 2017-06-26 NOTE — Telephone Encounter (Signed)
Patient called stating olmesartan no longer available. Requesting alternative medication.   [id number] [place], [place]           [phone] (Phone) [phone] (Fax)

## 2017-06-27 NOTE — Telephone Encounter (Signed)
Copied from [place] [id number]. Topic: Quick Communication - See Telephone Encounter >> Jun 26, 2017 10:28 AM Burnis Medin, NT wrote: CRM for notification. See Telephone encounter for: 06/26/17. Patient called and said they are not making olmesartan and wanted to see if the doctor can send [id number] [place], [place] - [address] [phone] (Phone) [phone] (Fax)     >> Jun 27, 2017 10:47 AM Cleaster Corin, NT wrote: Patient called back stating olmesartan no longer available. Requesting alternative medication. [id number] [address], [place] [phone] (Phone) [phone] (Fax)

## 2017-06-28 ENCOUNTER — Other Ambulatory Visit: Payer: Self-pay

## 2017-06-28 ENCOUNTER — Ambulatory Visit (HOSPITAL_COMMUNITY)
Admission: EM | Admit: 2017-06-28 | Discharge: 2017-06-28 | Disposition: A | Discharge status: Home / Self Care | Payer: Managed Care, Other (non HMO) | Attending: Urgent Care | Admitting: Urgent Care

## 2017-06-28 ENCOUNTER — Encounter (HOSPITAL_COMMUNITY): Payer: Self-pay | Admitting: Emergency Medicine

## 2017-06-28 DIAGNOSIS — I10 Essential (primary) hypertension: Secondary | ICD-10-CM

## 2017-06-28 DIAGNOSIS — R059 Cough, unspecified: Secondary | ICD-10-CM

## 2017-06-28 DIAGNOSIS — R0981 Nasal congestion: Secondary | ICD-10-CM

## 2017-06-28 DIAGNOSIS — J069 Acute upper respiratory infection, unspecified: Secondary | ICD-10-CM

## 2017-06-28 DIAGNOSIS — R05 Cough: Secondary | ICD-10-CM

## 2017-06-28 DIAGNOSIS — R070 Pain in throat: Secondary | ICD-10-CM

## 2017-06-28 DIAGNOSIS — R030 Elevated blood-pressure reading, without diagnosis of hypertension: Secondary | ICD-10-CM

## 2017-06-28 MED ORDER — AMLODIPINE BESYLATE 5 MG PO TABS: 5.0000 mg | ORAL_TABLET | Freq: Every day | ORAL | 0 refills | Status: DC

## 2017-06-28 MED ORDER — TELMISARTAN 20 MG PO TABS: 20.0000 mg | ORAL_TABLET | Freq: Every day | ORAL | 0 refills | Status: DC

## 2017-06-28 MED ORDER — PSEUDOEPHEDRINE HCL 60 MG PO TABS: 60.0000 mg | ORAL_TABLET | Freq: Two times a day (BID) | ORAL | 0 refills | Status: DC | PRN

## 2017-06-28 MED ORDER — DOXYCYCLINE HYCLATE 100 MG PO CAPS: 100.0000 mg | ORAL_CAPSULE | Freq: Two times a day (BID) | ORAL | 0 refills | Status: DC

## 2017-06-28 MED ORDER — BENZONATATE 100 MG PO CAPS: 100.0000 mg | ORAL_CAPSULE | Freq: Three times a day (TID) | ORAL | 0 refills | Status: DC | PRN

## 2017-06-28 MED ORDER — HYDROCOD POLST-CPM POLST ER 10-8 MG/5ML PO SUER: 5.0000 mL | Freq: Every evening | ORAL | 0 refills | Status: DC | PRN

## 2017-06-28 NOTE — Telephone Encounter (Signed)
Increase the norvasc to 5 mg and f/u with ov in 2-3 weeks

## 2017-06-28 NOTE — Discharge Instructions (Addendum)
Hydrate well with at least 2 liters (1 gallon) of water daily. For sore throat try using a honey-based tea. Use 3 teaspoons of honey with juice squeezed from half lemon. Place shaved pieces of ginger into 1/2-1 cup of water and warm over stove top. Then mix the ingredients and repeat every 4 hours as needed.  °

## 2017-06-28 NOTE — Telephone Encounter (Signed)
Left detailed message on machine and new rx sent in

## 2017-06-28 NOTE — ED Triage Notes (Signed)
Seen by Bess Harvest, pa

## 2017-06-28 NOTE — ED Provider Notes (Signed)
MRN: [id number] DOB: 1961-02-05  Subjective:   Sandy Green is a 57 y.o. female presenting for 1 week history of sore throat, productive cough, sinus headache, sinus pressure. Has tried Coricidin, Robitussin DM. Denies fever, chest pain, shob, wheezing, sinus pain, ear pain, ear drainage, n/v, abdominal pain. Denies smoking cigarettes. Has a history of asthma, is well controlled, has not needed her rescue inhaler in 20 years. Uses Qvar daily.    Current Facility-Administered Medications:  .  0.9 %  sodium chloride infusion, 500 mL, Intravenous, Continuous, Armbruster, Carlota Raspberry, MD  Current Outpatient Medications:  .  albuterol (PROVENTIL HFA;VENTOLIN HFA) 108 (90 BASE) MCG/ACT inhaler, Inhale 1-2 puffs into the lungs every 6 (six) hours as needed for wheezing or shortness of breath., Disp: 1 Inhaler, Rfl: 0 .  amLODipine (NORVASC) 2.5 MG tablet, Take 1 tablet (2.5 mg total) daily by mouth., Disp: 90 tablet, Rfl: 3 .  atorvastatin (LIPITOR) 20 MG tablet, Take 1 tablet (20 mg total) by mouth daily., Disp: 90 tablet, Rfl: 1 .  Beclomethasone Diprop HFA (QVAR REDIHALER) 80 MCG/ACT AERB, Inhale 2 puffs into the lungs 2 (two) times daily., Disp: 3 Inhaler, Rfl: 2 .  cyclobenzaprine (FLEXERIL) 10 MG tablet, Take 1 tablet (10 mg total) by mouth 2 (two) times daily as needed for muscle spasms., Disp: 20 tablet, Rfl: 0 .  escitalopram (LEXAPRO) 10 MG tablet, TAKE 1 TABLET BY MOUTH EVERY DAY, Disp: 90 tablet, Rfl: 1 .  Levocetirizine Dihydrochloride (XYZAL ALLERGY 24HR PO), Take 1 tablet by mouth daily., Disp: , Rfl:  .  montelukast (SINGULAIR) 10 MG tablet, TAKE 1 TABLET BY MOUTH AT BEDTIME, Disp: 90 tablet, Rfl: 1 .  norethindrone-ethinyl estradiol-iron (MICROGESTIN FE,GILDESS FE,LOESTRIN FE) 1.5-30 MG-MCG tablet, Take 1 tablet by mouth daily., Disp: 3 Package, Rfl: 1 .  olmesartan (BENICAR) 40 MG tablet, Take 1 tablet (40 mg total) daily by mouth., Disp: 90 tablet, Rfl: 1   Allergies  Allergen Reactions   . Penicillins Rash and Other (See Comments)    Reaction unknown  . Tetracyclines & Related Nausea Only and Rash    GI upset    Past Medical History:  Diagnosis Date  . Allergy   . Ankle fracture, left   . Anxiety   . Asthma   . Chicken pox   . Elbow fracture   . Heart murmur   . Hyperlipemia   . Hypertension     Past Surgical History:  Procedure Laterality Date  . ELBOW FRACTURE SURGERY Left   . WISDOM TOOTH EXTRACTION     Objective:   Vitals: BP (!) 147/94 (BP Location: Left Arm) Comment: reported BP to PA [employer]  Pulse 86   Temp 98.2 F (36.8 C) (Oral)   Resp 16   SpO2 96%   Physical Exam  Constitutional: She is oriented to person, place, and time. She appears well-developed and well-nourished.  HENT:  Right Ear: Tympanic membrane normal.  Left Ear: Tympanic membrane normal.  Mouth/Throat: No oropharyngeal exudate, posterior oropharyngeal edema, posterior oropharyngeal erythema or tonsillar abscesses.  Post-nasal drainage over throat.  Eyes: Right eye exhibits no discharge. Left eye exhibits no discharge.  Neck: Normal range of motion. Neck supple.  Cardiovascular: Normal rate, regular rhythm and intact distal pulses. Exam reveals no gallop and no friction rub.  No murmur heard. Pulmonary/Chest: No respiratory distress. She has no wheezes. She has no rales.  Lymphadenopathy:    She has no cervical adenopathy.  Neurological: She is alert and oriented to  person, place, and time.  Skin: Skin is warm and dry.  Psychiatric: She has a normal mood and affect.   Assessment and Plan :   Sinus congestion  Throat pain  Cough  Viral URI  Essential hypertension  Elevated blood pressure reading  Will manage supportively for viral illness. In case, she has no improvement in 3-4 days, provided with script for doxycycline as patient reports that she only gets nausea with tetracycline; not a rash. Counseled patient on potential for adverse effects with  medications prescribed today, patient verbalized understanding. Patient does not have omelsartan, has not been able to get in touch with her PCP. I prescribed Micardis for her in the meantime. Return-to-clinic precautions discussed, patient verbalized understanding.    Jaynee Eagles, [place] 06/28/17 1047

## 2017-07-03 ENCOUNTER — Telehealth: Payer: Self-pay | Admitting: Family Medicine

## 2017-07-03 NOTE — Telephone Encounter (Signed)
Patient called in stating that we never changed her medication on 06/26/17. I explained to patient the patient Dr.Lowne's request to increase Norvasc to 5 mg and follow up in 2-3 weeks, she states she never got that message and [facility]. She also states that she has taken Norvasc in the past and had reactions to it, she went to UC and got a script for Micardis, she states this medication is working well for her so far and would like a call back today to know if she can continue this and to have it called in to her pharmarcy.   Please advise and call patient today,

## 2017-07-03 NOTE — Telephone Encounter (Signed)
She should con't it and come in for bp check with nurse

## 2017-07-04 NOTE — Telephone Encounter (Signed)
Lm for patient to return my call.

## 2017-07-04 NOTE — Telephone Encounter (Signed)
Spoke with patient and advised her of Dr. Nonda Lou recommendations, Patient is schedule for a BP check with the nurse on 07/11/17 @ 9:45. Advised patient to reach out if she has another other issues before then, Patient voiced understanding.

## 2017-07-11 ENCOUNTER — Ambulatory Visit (INDEPENDENT_AMBULATORY_CARE_PROVIDER_SITE_OTHER): Payer: Managed Care, Other (non HMO) | Admitting: Family Medicine

## 2017-07-11 VITALS — BP 124/84 | HR 83

## 2017-07-11 DIAGNOSIS — I10 Essential (primary) hypertension: Secondary | ICD-10-CM

## 2017-07-11 MED ORDER — TELMISARTAN 40 MG PO TABS: 40.0000 mg | ORAL_TABLET | Freq: Every day | ORAL | 1 refills | Status: DC

## 2017-07-11 NOTE — Patient Instructions (Signed)
Stop amlodipine.  Increase telmisartan to 40mg  once a day. I have sent a prescription to your pharmacy.  Follow up with Dr Carollee Herter on 08/22/17 at 2pm.

## 2017-07-11 NOTE — Progress Notes (Signed)
Pre visit review using our clinic review tool, if applicable. No additional management support is needed unless otherwise documented below in the visit note.  Pt here for blood pressure check per Dr Carollee Herter.  Olmesartan was on recall and medication was changed on 06/26/17 to telmisartan 20mg  once a day.  BP Readings from Last 3 Encounters:  06/28/17 (!) 147/94  03/02/17 (!) 144/94  02/16/17 (!) 143/89   Pt currently takes: Micardis 20mg  once a day. Amlodipine 2.5mg  once a day. Pt states she has had intermittent dizziness episodes almost daily for at least 6 months. Sometimes occurs with position change and sometimes while walking but notes this started after beginning amlodipine.  BP @ 9:55am =  138/96 HR = 83  Repeat BP @ 10:14am = 124/84  Per verbal from PCP, ok to d/c amlodipine and increase telmisartan to 40mg  once a day and follow up in 6 to 8 weeks. Rx sent.  Appointment scheduled for 08/22/17 at [place], DO

## 2017-07-17 ENCOUNTER — Encounter: Payer: Self-pay | Admitting: Family Medicine

## 2017-07-18 NOTE — Telephone Encounter (Signed)
Pharmacy had medication on hold and will fill for patient.  They stated that she was on olmisartan.  Advised that patient was not taking and to d/c.  Patient notified.

## 2017-07-24 ENCOUNTER — Encounter: Payer: Self-pay | Admitting: Family Medicine

## 2017-07-24 NOTE — Telephone Encounter (Signed)
Can call in tessalon perles for cough 200 mg tid prn #30   Take antihistamine with singulair ---- ie claritin , zyrtec , allegra Ov if no help mucinex can help thin the mucus so she can cough it up if needed---  Just drink plenty of water

## 2017-07-25 ENCOUNTER — Other Ambulatory Visit: Payer: Self-pay | Admitting: *Deleted

## 2017-07-25 MED ORDER — BENZONATATE 200 MG PO CAPS: 200.0000 mg | ORAL_CAPSULE | Freq: Three times a day (TID) | ORAL | 0 refills | Status: DC | PRN

## 2017-07-25 NOTE — Progress Notes (Signed)
te

## 2017-08-10 ENCOUNTER — Other Ambulatory Visit: Payer: Self-pay | Admitting: Family Medicine

## 2017-08-22 ENCOUNTER — Ambulatory Visit: Payer: Managed Care, Other (non HMO) | Admitting: Family Medicine

## 2017-08-22 ENCOUNTER — Encounter: Payer: Self-pay | Admitting: Family Medicine

## 2017-08-22 VITALS — BP 136/82 | HR 88 | Temp 98.4°F | Resp 16 | Ht 66.0 in | Wt 191.8 lb

## 2017-08-22 DIAGNOSIS — I10 Essential (primary) hypertension: Secondary | ICD-10-CM | POA: Diagnosis not present

## 2017-08-22 NOTE — Patient Instructions (Signed)
DASH Eating Plan DASH stands for "Dietary Approaches to Stop Hypertension." The DASH eating plan is a healthy eating plan that has been shown to reduce high blood pressure (hypertension). It may also reduce your risk for type 2 diabetes, heart disease, and stroke. The DASH eating plan may also help with weight loss. What are tips for following this plan? General guidelines  Avoid eating more than 2,300 mg (milligrams) of salt (sodium) a day. If you have hypertension, you may need to reduce your sodium intake to 1,500 mg a day.  Limit alcohol intake to no more than 1 drink a day for nonpregnant women and 2 drinks a day for men. One drink equals 12 oz of beer, 5 oz of wine, or 1 oz of hard liquor.  Work with your health care provider to maintain a healthy body weight or to lose weight. Ask what an ideal weight is for you.  Get at least 30 minutes of exercise that causes your heart to beat faster (aerobic exercise) most days of the week. Activities may include walking, swimming, or biking.  Work with your health care provider or diet and nutrition specialist (dietitian) to adjust your eating plan to your individual calorie needs. Reading food labels  Check food labels for the amount of sodium per serving. Choose foods with less than 5 percent of the Daily Value of sodium. Generally, foods with less than 300 mg of sodium per serving fit into this eating plan.  To find whole grains, look for the word "whole" as the first word in the ingredient list. Shopping  Buy products labeled as "low-sodium" or "no salt added."  Buy fresh foods. Avoid canned foods and premade or frozen meals. Cooking  Avoid adding salt when cooking. Use salt-free seasonings or herbs instead of table salt or sea salt. Check with your health care provider or pharmacist before using salt substitutes.  Do not fry foods. Cook foods using healthy methods such as baking, boiling, grilling, and broiling instead.  Cook with  heart-healthy oils, such as olive, canola, soybean, or sunflower oil. Meal planning   Eat a balanced diet that includes: ? 5 or more servings of fruits and vegetables each day. At each meal, try to fill half of your plate with fruits and vegetables. ? Up to 6-8 servings of whole grains each day. ? Less than 6 oz of lean meat, poultry, or fish each day. A 3-oz serving of meat is about the same size as a deck of cards. One egg equals 1 oz. ? 2 servings of low-fat dairy each day. ? A serving of nuts, seeds, or beans 5 times each week. ? Heart-healthy fats. Healthy fats called Omega-3 fatty acids are found in foods such as flaxseeds and coldwater fish, like sardines, salmon, and mackerel.  Limit how much you eat of the following: ? Canned or prepackaged foods. ? Food that is high in trans fat, such as fried foods. ? Food that is high in saturated fat, such as fatty meat. ? Sweets, desserts, sugary drinks, and other foods with added sugar. ? Full-fat dairy products.  Do not salt foods before eating.  Try to eat at least 2 vegetarian meals each week.  Eat more home-cooked food and less restaurant, buffet, and fast food.  When eating at a restaurant, ask that your food be prepared with less salt or no salt, if possible. What foods are recommended? The items listed may not be a complete list. Talk with your dietitian about what   dietary choices are best for you. Grains Whole-grain or whole-wheat bread. Whole-grain or whole-wheat pasta. Gingrich rice. Oatmeal. Quinoa. Bulgur. Whole-grain and low-sodium cereals. Pita bread. Low-fat, low-sodium crackers. Whole-wheat flour tortillas. Vegetables Fresh or frozen vegetables (raw, steamed, roasted, or grilled). Low-sodium or reduced-sodium tomato and vegetable juice. Low-sodium or reduced-sodium tomato sauce and tomato paste. Low-sodium or reduced-sodium canned vegetables. Fruits All fresh, dried, or frozen fruit. Canned fruit in natural juice (without  added sugar). Meat and other protein foods Skinless chicken or turkey. Ground chicken or turkey. Pork with fat trimmed off. Fish and seafood. Egg whites. Dried beans, peas, or lentils. Unsalted nuts, nut butters, and seeds. Unsalted canned beans. Lean cuts of beef with fat trimmed off. Low-sodium, lean deli meat. Dairy Low-fat (1%) or fat-free (skim) milk. Fat-free, low-fat, or reduced-fat cheeses. Nonfat, low-sodium ricotta or cottage cheese. Low-fat or nonfat yogurt. Low-fat, low-sodium cheese. Fats and oils Soft margarine without trans fats. Vegetable oil. Low-fat, reduced-fat, or light mayonnaise and salad dressings (reduced-sodium). Canola, safflower, olive, soybean, and sunflower oils. Avocado. Seasoning and other foods Herbs. Spices. Seasoning mixes without salt. Unsalted popcorn and pretzels. Fat-free sweets. What foods are not recommended? The items listed may not be a complete list. Talk with your dietitian about what dietary choices are best for you. Grains Baked goods made with fat, such as croissants, muffins, or some breads. Dry pasta or rice meal packs. Vegetables Creamed or fried vegetables. Vegetables in a cheese sauce. Regular canned vegetables (not low-sodium or reduced-sodium). Regular canned tomato sauce and paste (not low-sodium or reduced-sodium). Regular tomato and vegetable juice (not low-sodium or reduced-sodium). Pickles. Olives. Fruits Canned fruit in a light or heavy syrup. Fried fruit. Fruit in cream or butter sauce. Meat and other protein foods Fatty cuts of meat. Ribs. Fried meat. Bacon. Sausage. Bologna and other processed lunch meats. Salami. Fatback. Hotdogs. Bratwurst. Salted nuts and seeds. Canned beans with added salt. Canned or smoked fish. Whole eggs or egg yolks. Chicken or turkey with skin. Dairy Whole or 2% milk, cream, and half-and-half. Whole or full-fat cream cheese. Whole-fat or sweetened yogurt. Full-fat cheese. Nondairy creamers. Whipped toppings.  Processed cheese and cheese spreads. Fats and oils Butter. Stick margarine. Lard. Shortening. Ghee. Bacon fat. Tropical oils, such as coconut, palm kernel, or palm oil. Seasoning and other foods Salted popcorn and pretzels. Onion salt, garlic salt, seasoned salt, table salt, and sea salt. Worcestershire sauce. Tartar sauce. Barbecue sauce. Teriyaki sauce. Soy sauce, including reduced-sodium. Steak sauce. Canned and packaged gravies. Fish sauce. Oyster sauce. Cocktail sauce. Horseradish that you find on the shelf. Ketchup. Mustard. Meat flavorings and tenderizers. Bouillon cubes. Hot sauce and Tabasco sauce. Premade or packaged marinades. Premade or packaged taco seasonings. Relishes. Regular salad dressings. Where to find more information:  National Heart, Lung, and Blood Institute: www.nhlbi.nih.gov  American Heart Association: www.heart.org Summary  The DASH eating plan is a healthy eating plan that has been shown to reduce high blood pressure (hypertension). It may also reduce your risk for type 2 diabetes, heart disease, and stroke.  With the DASH eating plan, you should limit salt (sodium) intake to 2,300 mg a day. If you have hypertension, you may need to reduce your sodium intake to 1,500 mg a day.  When on the DASH eating plan, aim to eat more fresh fruits and vegetables, whole grains, lean proteins, low-fat dairy, and heart-healthy fats.  Work with your health care provider or diet and nutrition specialist (dietitian) to adjust your eating plan to your individual   calorie needs. This information is not intended to replace advice given to you by your health care provider. Make sure you discuss any questions you have with your health care provider. Document Released: 03/10/2011 Document Revised: 03/14/2016 Document Reviewed: 03/14/2016 Elsevier Interactive Patient Education  2018 Elsevier Inc.  

## 2017-08-22 NOTE — Progress Notes (Signed)
Subjective:  I acted as a Education administrator for [employer]. Sandy Green, [place]   Patient ID: Sandy Green, female    DOB: May 23, 1960, 57 y.o.   MRN: [id number]  Chief Complaint  Patient presents with  . Hypertension    HPI  Patient is in today for follow up on hypertension.   No complaints.    Patient Care Team: Carollee Herter, Alferd Apa, DO as PCP - General (Family Medicine)   Past Medical History:  Diagnosis Date  . Allergy   . Ankle fracture, left   . Anxiety   . Asthma   . Chicken pox   . Elbow fracture   . Heart murmur   . Hyperlipemia   . Hypertension     Past Surgical History:  Procedure Laterality Date  . ELBOW FRACTURE SURGERY Left   . WISDOM TOOTH EXTRACTION      Family History  Problem Relation Age of Onset  . Hypertension Mother   . Hypertension Father   . Heart disease Father        [email or web address]  . Diabetes Maternal Grandmother   . Cancer Maternal Grandmother        Unsure of kind? Breast  . Colon cancer Neg Hx   . Esophageal cancer Neg Hx   . Rectal cancer Neg Hx   . Stomach cancer Neg Hx     Social History   Socioeconomic History  . Marital status: Married    Spouse name: Not on file  . Number of children: Not on file  . Years of education: Not on file  . Highest education level: Not on file  Occupational History  . Not on file  Social Needs  . Financial resource strain: Not on file  . Food insecurity:    Worry: Not on file    Inability: Not on file  . Transportation needs:    Medical: Not on file    Non-medical: Not on file  Tobacco Use  . Smoking status: Never Smoker  . Smokeless tobacco: Never Used  Substance and Sexual Activity  . Alcohol use: Yes    Alcohol/week: 0.0 oz    Comment: Rare  . Drug use: No  . Sexual activity: Yes    Partners: Male  Lifestyle  . Physical activity:    Days per week: Not on file    Minutes per session: Not on file  . Stress: Not on file  Relationships  . Social connections:    Talks on phone: Not on file   Gets together: Not on file    Attends religious service: Not on file    Active member of club or organization: Not on file    Attends meetings of clubs or organizations: Not on file    Relationship status: Not on file  . Intimate partner violence:    Fear of current or ex partner: Not on file    Emotionally abused: Not on file    Physically abused: Not on file    Forced sexual activity: Not on file  Other Topics Concern  . Not on file  Social History Narrative   Exercise-- no    Married,lives with husband.   Unemployed.    Outpatient Medications Prior to Visit  Medication Sig Dispense Refill  . albuterol (PROVENTIL HFA;VENTOLIN HFA) 108 (90 BASE) MCG/ACT inhaler Inhale 1-2 puffs into the lungs every 6 (six) hours as needed for wheezing or shortness of breath. 1 Inhaler 0  . atorvastatin (LIPITOR) 20 MG tablet TAKE 1 TABLET  BY MOUTH EVERY DAY 90 tablet 0  . Beclomethasone Diprop HFA (QVAR REDIHALER) 80 MCG/ACT AERB Inhale 2 puffs into the lungs 2 (two) times daily. 3 Inhaler 2  . chlorpheniramine-HYDROcodone (TUSSIONEX PENNKINETIC ER) 10-8 MG/5ML SUER Take 5 mLs by mouth at bedtime as needed for cough. 100 mL 0  . escitalopram (LEXAPRO) 10 MG tablet TAKE 1 TABLET BY MOUTH EVERY DAY 90 tablet 1  . Levocetirizine Dihydrochloride (XYZAL ALLERGY 24HR PO) Take 1 tablet by mouth daily.    . montelukast (SINGULAIR) 10 MG tablet TAKE 1 TABLET BY MOUTH EVERYDAY AT BEDTIME 90 tablet 0  . norethindrone-ethinyl estradiol-iron (MICROGESTIN FE,GILDESS FE,LOESTRIN FE) 1.5-30 MG-MCG tablet Take 1 tablet by mouth daily. 3 Package 1  . telmisartan (MICARDIS) 40 MG tablet TAKE 1 TABLET BY MOUTH EVERY DAY 90 tablet 1  . benzonatate (TESSALON) 200 MG capsule Take 1 capsule (200 mg total) by mouth 3 (three) times daily as needed for cough. 30 capsule 0  . cyclobenzaprine (FLEXERIL) 10 MG tablet Take 1 tablet (10 mg total) by mouth 2 (two) times daily as needed for muscle spasms. 20 tablet 0  . doxycycline  (VIBRAMYCIN) 100 MG capsule Take 1 capsule (100 mg total) by mouth 2 (two) times daily. 14 capsule 0  . olmesartan (BENICAR) 40 MG tablet Take 1 tablet (40 mg total) daily by mouth. 90 tablet 1  . pseudoephedrine (SUDAFED) 60 MG tablet Take 1 tablet (60 mg total) by mouth 2 (two) times daily as needed for congestion. 30 tablet 0   Facility-Administered Medications Prior to Visit  Medication Dose Route Frequency Provider Last Rate Last Dose  . 0.9 %  sodium chloride infusion  500 mL Intravenous Continuous Armbruster, Carlota Raspberry, MD        Allergies  Allergen Reactions  . Amlodipine     "dizziness"  . Naproxen     "tired and dizziness"  . Penicillins Rash and Other (See Comments)    Reaction unknown  . Tetracyclines & Related Nausea Only and Rash    GI upset    Review of Systems  Constitutional: Negative for chills, fever and malaise/fatigue.  HENT: Negative for congestion and hearing loss.   Eyes: Negative for discharge.  Respiratory: Negative for cough, sputum production and shortness of breath.   Cardiovascular: Negative for chest pain, palpitations and leg swelling.  Gastrointestinal: Negative for abdominal pain, blood in stool, constipation, diarrhea, heartburn, nausea and vomiting.  Genitourinary: Negative for dysuria, frequency, hematuria and urgency.  Musculoskeletal: Negative for back pain, falls and myalgias.  Skin: Negative for rash.  Neurological: Negative for dizziness, sensory change, loss of consciousness, weakness and headaches.  Endo/Heme/Allergies: Negative for environmental allergies. Does not bruise/bleed easily.  Psychiatric/Behavioral: Negative for depression and suicidal ideas. The patient is not nervous/anxious and does not have insomnia.        Objective:    Physical Exam  Constitutional: She is oriented to person, place, and time. She appears well-developed and well-nourished.  HENT:  Head: Normocephalic and atraumatic.  Eyes: Conjunctivae and EOM are  normal.  Neck: Normal range of motion. Neck supple. No JVD present. Carotid bruit is not present. No thyromegaly present.  Cardiovascular: Normal rate, regular rhythm and normal heart sounds.  No murmur heard. Pulmonary/Chest: Effort normal and breath sounds normal. No respiratory distress. She has no wheezes. She has no rales. She exhibits no tenderness.  Musculoskeletal: She exhibits no edema.  Neurological: She is alert and oriented to person, place, and time.  Psychiatric: She  has a normal mood and affect.  Nursing note and vitals reviewed.   BP 136/82 (BP Location: Left Arm, Patient Position: Sitting, Cuff Size: Normal)   Pulse 88   Temp 98.4 F (36.9 C) (Oral)   Resp 16   Ht 5\' 6"  (1.676 m)   Wt 191 lb 12.8 oz (87 kg)   SpO2 98%   BMI 30.96 kg/m  Wt Readings from Last 3 Encounters:  08/22/17 191 lb 12.8 oz (87 kg)  02/16/17 193 lb (87.5 kg)  11/01/16 188 lb 6 oz (85.4 kg)   BP Readings from Last 3 Encounters:  08/22/17 136/82  07/11/17 124/84  06/28/17 (!) 147/94     Immunization History  Administered Date(s) Administered  . Influenza, Seasonal, Injecte, Preservative Fre 12/12/2014  . Influenza,inj,Quad PF,6+ Mos 01/30/2013, 01/11/2014  . Influenza-Unspecified 12/26/2015  . Tdap 02/28/2008, 07/14/2016    Health Maintenance  Topic Date Due  . HIV Screening  05/22/1977  . MAMMOGRAM  08/23/2018 (Originally 05/27/2017)  . INFLUENZA VACCINE  11/02/2017  . PAP SMEAR  03/22/2018  . COLONOSCOPY  07/18/2021  . TETANUS/TDAP  07/15/2026  . Hepatitis C Screening  Completed    Lab Results  Component Value Date   WBC 8.2 09/29/2016   HGB 13.3 09/29/2016   HCT 39.8 09/29/2016   PLT 266.0 09/29/2016   GLUCOSE 93 11/01/2016   CHOL 171 11/01/2016   TRIG 94.0 11/01/2016   HDL 52.30 11/01/2016   LDLCALC 100 (H) 11/01/2016   ALT 14 11/01/2016   AST 17 11/01/2016   NA 139 11/01/2016   K 3.4 (L) 11/01/2016   CL 105 11/01/2016   CREATININE 1.01 11/01/2016   BUN 17  11/01/2016   CO2 28 11/01/2016   TSH 1.15 04/29/2016    Lab Results  Component Value Date   TSH 1.15 04/29/2016   Lab Results  Component Value Date   WBC 8.2 09/29/2016   HGB 13.3 09/29/2016   HCT 39.8 09/29/2016   MCV 90.5 09/29/2016   PLT 266.0 09/29/2016   Lab Results  Component Value Date   NA 139 11/01/2016   K 3.4 (L) 11/01/2016   CO2 28 11/01/2016   GLUCOSE 93 11/01/2016   BUN 17 11/01/2016   CREATININE 1.01 11/01/2016   BILITOT 1.3 (H) 11/01/2016   ALKPHOS 62 11/01/2016   AST 17 11/01/2016   ALT 14 11/01/2016   PROT 6.8 11/01/2016   ALBUMIN 3.5 11/01/2016   CALCIUM 8.5 11/01/2016   GFR 60.61 11/01/2016   Lab Results  Component Value Date   CHOL 171 11/01/2016   Lab Results  Component Value Date   HDL 52.30 11/01/2016   Lab Results  Component Value Date   LDLCALC 100 (H) 11/01/2016   Lab Results  Component Value Date   TRIG 94.0 11/01/2016   Lab Results  Component Value Date   CHOLHDL 3 11/01/2016   No results found for: HGBA1C       Assessment & Plan:   Problem List Items Addressed This Visit      Unprioritized   Essential hypertension - Primary   Relevant Orders   Lipid panel   Comprehensive metabolic panel    Well controlled, no changes to meds. Encouraged heart healthy diet such as the DASH diet and exercise as tolerated.   I have discontinued Madaline Savage Faley's olmesartan, cyclobenzaprine, pseudoephedrine, doxycycline, and benzonatate. I am also having her maintain her albuterol, Levocetirizine Dihydrochloride (XYZAL ALLERGY 24HR PO), beclomethasone, norethindrone-ethinyl estradiol-iron, escitalopram, montelukast, atorvastatin, chlorpheniramine-HYDROcodone, and  telmisartan. We will continue to administer sodium chloride.  No orders of the defined types were placed in this encounter.   CMA served as Education administrator during this visit. History, Physical and Plan performed by medical provider. Documentation and orders reviewed and attested to.    Ann Held, DO

## 2017-08-23 LAB — LIPID PANEL
CHOL/HDL RATIO: 4
CHOLESTEROL: 181 mg/dL (ref 0–200)
HDL: 50.3 mg/dL (ref >39.00)
LDL Cholesterol: 106 mg/dL — ABNORMAL HIGH (ref 0–99)
NonHDL: 130.29
TRIGLYCERIDES: 120 mg/dL (ref 0.0–149.0)
VLDL: 24 mg/dL (ref 0.0–40.0)

## 2017-08-23 LAB — COMPREHENSIVE METABOLIC PANEL
ALBUMIN: 3.4 g/dL — AB (ref 3.5–5.2)
ALT: 11 U/L (ref 0–35)
AST: 14 U/L (ref 0–37)
Alkaline Phosphatase: 58 U/L (ref 39–117)
BUN: 16 mg/dL (ref 6–23)
CALCIUM: 8.7 mg/dL (ref 8.4–10.5)
CHLORIDE: 106 meq/L (ref 96–112)
CO2: 26 meq/L (ref 19–32)
Creatinine, Ser: 1.14 mg/dL (ref 0.40–1.20)
GFR: 52.55 mL/min — AB (ref >60.00)
Glucose, Bld: 79 mg/dL (ref 70–99)
POTASSIUM: 4.1 meq/L (ref 3.5–5.1)
Sodium: 140 mEq/L (ref 135–145)
Total Bilirubin: 0.7 mg/dL (ref 0.2–1.2)
Total Protein: 6.5 g/dL (ref 6.0–8.3)

## 2017-08-29 ENCOUNTER — Other Ambulatory Visit: Payer: Self-pay

## 2017-08-29 DIAGNOSIS — I10 Essential (primary) hypertension: Secondary | ICD-10-CM

## 2017-08-29 MED ORDER — ATORVASTATIN CALCIUM 40 MG PO TABS: 40.0000 mg | ORAL_TABLET | Freq: Every day | ORAL | 2 refills | Status: DC

## 2017-09-04 ENCOUNTER — Encounter: Payer: Self-pay | Admitting: Podiatry

## 2017-09-04 ENCOUNTER — Ambulatory Visit (INDEPENDENT_AMBULATORY_CARE_PROVIDER_SITE_OTHER): Payer: Managed Care, Other (non HMO)

## 2017-09-04 ENCOUNTER — Ambulatory Visit: Payer: Managed Care, Other (non HMO) | Admitting: Podiatry

## 2017-09-04 DIAGNOSIS — M2041 Other hammer toe(s) (acquired), right foot: Secondary | ICD-10-CM

## 2017-09-04 DIAGNOSIS — M7751 Other enthesopathy of right foot: Secondary | ICD-10-CM

## 2017-09-04 DIAGNOSIS — M779 Enthesopathy, unspecified: Secondary | ICD-10-CM

## 2017-09-04 MED ORDER — TRIAMCINOLONE ACETONIDE 10 MG/ML IJ SUSP
10.0000 mg | Freq: Once | INTRAMUSCULAR | Status: AC
  Administered 2017-09-04: 10 mg

## 2017-09-04 NOTE — Progress Notes (Signed)
   Subjective:    Patient ID: Sandy Green, female    DOB: 06/01/1960, 57 y.o.   MRN: [id number]  HPI    Review of Systems  All other systems reviewed and are negative.      Objective:   Physical Exam        Assessment & Plan:

## 2017-09-06 NOTE — Progress Notes (Signed)
Subjective:   Patient ID: Sandy Green, female   DOB: 57 y.o.   MRN: [id number]   HPI Patient presents stating that she has a painful spot between the big toe and the second toe of the right foot that is inflamed and makes walking difficult.  Patient states that it is worse when she wears any kind of shoe gear and patient does not smoke and likes to be active   Review of Systems  All other systems reviewed and are negative.       Objective:  Physical Exam  Constitutional: She appears well-developed and well-nourished.  Cardiovascular: Intact distal pulses.  Pulmonary/Chest: Effort normal.  Musculoskeletal: Normal range of motion.  Neurological: She is alert.  Skin: Skin is warm.  Nursing note and vitals reviewed.   Neurovascular status intact muscle strength is adequate range of motion was within normal limits with patient noted to have a inflamed area on the medial side digit to right with fluid buildup of the inner phalangeal joint and pressure between the big toe and second toe the right foot with structural changes noted.  Patient has good digital perfusion well oriented x3     Assessment:  Inflammatory capsulitis inner phalangeal joint of the second toe right with pressure between the big toe and second toe of the right foot consistent with hammertoe deformity     Plan:  H&P x-rays reviewed condition discussed.  Today I did careful injection of the inner phalangeal joint right 2 mg dexamethasone Kenalog 2 mg Xylocaine debrided the lesion applied padding and discussed possibility at one point future this may require surgical intervention  X-ray indicates that the toes do have structural malalignment with pressure between the hallux and second digit of the right foot

## 2017-09-18 ENCOUNTER — Other Ambulatory Visit: Payer: Self-pay | Admitting: Family Medicine

## 2017-09-23 ENCOUNTER — Other Ambulatory Visit: Payer: Self-pay | Admitting: Family Medicine

## 2017-10-27 ENCOUNTER — Other Ambulatory Visit: Payer: Self-pay | Admitting: Family Medicine

## 2017-10-27 DIAGNOSIS — F419 Anxiety disorder, unspecified: Secondary | ICD-10-CM

## 2017-11-09 ENCOUNTER — Telehealth: Payer: Self-pay | Admitting: *Deleted

## 2017-11-09 NOTE — Telephone Encounter (Signed)
OK to d/c Micardis and rx Irbesartan 150 mg tabs, 1 tab po daily and then she needs a bp check with cmp in 2-3 weeks. Disp #30 with 1 rf

## 2017-11-09 NOTE — Telephone Encounter (Signed)
[facility] [address] sent fax over stating the all strengths of micardis is on backorder.  Can we send in something else?

## 2017-11-10 MED ORDER — IRBESARTAN 150 MG PO TABS: 150.0000 mg | ORAL_TABLET | Freq: Every day | ORAL | 2 refills | Status: DC

## 2017-11-10 NOTE — Telephone Encounter (Signed)
New rx sent in and left a detailed message on patient phone about the change and to make an 2-3 week nurse visit.

## 2017-11-14 ENCOUNTER — Encounter: Payer: Self-pay | Admitting: Family Medicine

## 2017-11-14 MED ORDER — BECLOMETHASONE DIPROP HFA 80 MCG/ACT IN AERB: 2.0000 | INHALATION_SPRAY | Freq: Two times a day (BID) | RESPIRATORY_TRACT | 1 refills | Status: DC

## 2017-11-22 ENCOUNTER — Other Ambulatory Visit: Payer: Self-pay | Admitting: Family Medicine

## 2017-11-23 ENCOUNTER — Encounter: Payer: Self-pay | Admitting: Family Medicine

## 2017-11-23 ENCOUNTER — Ambulatory Visit (INDEPENDENT_AMBULATORY_CARE_PROVIDER_SITE_OTHER): Payer: Managed Care, Other (non HMO) | Admitting: Family Medicine

## 2017-11-23 VITALS — BP 142/84 | HR 80 | Temp 98.3°F | Resp 16 | Ht 66.0 in | Wt 187.0 lb

## 2017-11-23 DIAGNOSIS — E785 Hyperlipidemia, unspecified: Secondary | ICD-10-CM | POA: Diagnosis not present

## 2017-11-23 DIAGNOSIS — I10 Essential (primary) hypertension: Secondary | ICD-10-CM | POA: Diagnosis not present

## 2017-11-23 DIAGNOSIS — Z23 Encounter for immunization: Secondary | ICD-10-CM | POA: Diagnosis not present

## 2017-11-23 DIAGNOSIS — Z0000 Encounter for general adult medical examination without abnormal findings: Secondary | ICD-10-CM

## 2017-11-23 LAB — CBC WITH DIFFERENTIAL/PLATELET
BASOS ABS: 0.1 10*3/uL (ref 0.0–0.1)
BASOS PCT: 1.6 % (ref 0.0–3.0)
Eosinophils Absolute: 0.2 10*3/uL (ref 0.0–0.7)
Eosinophils Relative: 2.8 % (ref 0.0–5.0)
HCT: 39.8 % (ref 36.0–46.0)
Hemoglobin: 13.2 g/dL (ref 12.0–15.0)
LYMPHS ABS: 1.7 10*3/uL (ref 0.7–4.0)
LYMPHS PCT: 25.5 % (ref 12.0–46.0)
MCHC: 33.1 g/dL (ref 30.0–36.0)
MCV: 91.5 fl (ref 78.0–100.0)
MONO ABS: 0.7 10*3/uL (ref 0.1–1.0)
Monocytes Relative: 10 % (ref 3.0–12.0)
Neutro Abs: 4 10*3/uL (ref 1.4–7.7)
Neutrophils Relative %: 60.1 % (ref 43.0–77.0)
PLATELETS: 280 10*3/uL (ref 150.0–400.0)
RBC: 4.35 Mil/uL (ref 3.87–5.11)
RDW: 13.3 % (ref 11.5–15.5)
WBC: 6.7 10*3/uL (ref 4.0–10.5)

## 2017-11-23 LAB — LIPID PANEL
CHOLESTEROL: 187 mg/dL (ref 0–200)
HDL: 50.5 mg/dL (ref >39.00)
LDL CALC: 115 mg/dL — AB (ref 0–99)
NonHDL: 136.24
Total CHOL/HDL Ratio: 4
Triglycerides: 105 mg/dL (ref 0.0–149.0)
VLDL: 21 mg/dL (ref 0.0–40.0)

## 2017-11-23 LAB — COMPREHENSIVE METABOLIC PANEL
ALT: 11 U/L (ref 0–35)
AST: 17 U/L (ref 0–37)
Albumin: 3.5 g/dL (ref 3.5–5.2)
Alkaline Phosphatase: 70 U/L (ref 39–117)
BUN: 18 mg/dL (ref 6–23)
CALCIUM: 8.9 mg/dL (ref 8.4–10.5)
CHLORIDE: 106 meq/L (ref 96–112)
CO2: 29 meq/L (ref 19–32)
Creatinine, Ser: 1.11 mg/dL (ref 0.40–1.20)
GFR: 54.14 mL/min — AB (ref >60.00)
GLUCOSE: 89 mg/dL (ref 70–99)
POTASSIUM: 3.8 meq/L (ref 3.5–5.1)
Sodium: 140 mEq/L (ref 135–145)
Total Bilirubin: 1.1 mg/dL (ref 0.2–1.2)
Total Protein: 7 g/dL (ref 6.0–8.3)

## 2017-11-23 LAB — TSH: TSH: 0.89 u[IU]/mL (ref 0.35–4.50)

## 2017-11-23 NOTE — Patient Instructions (Signed)
Preventive Care 40-64 Years, Female Preventive care refers to lifestyle choices and visits with your health care provider that can promote health and wellness. What does preventive care include?  A yearly physical exam. This is also called an annual well check.  Dental exams once or twice a year.  Routine eye exams. Ask your health care provider how often you should have your eyes checked.  Personal lifestyle choices, including: ? Daily care of your teeth and gums. ? Regular physical activity. ? Eating a healthy diet. ? Avoiding tobacco and drug use. ? Limiting alcohol use. ? Practicing safe sex. ? Taking low-dose aspirin daily starting at age 58. ? Taking vitamin and mineral supplements as recommended by your health care provider. What happens during an annual well check? The services and screenings done by your health care provider during your annual well check will depend on your age, overall health, lifestyle risk factors, and family history of disease. Counseling Your health care provider may ask you questions about your:  Alcohol use.  Tobacco use.  Drug use.  Emotional well-being.  Home and relationship well-being.  Sexual activity.  Eating habits.  Work and work Statistician.  Method of birth control.  Menstrual cycle.  Pregnancy history.  Screening You may have the following tests or measurements:  Height, weight, and BMI.  Blood pressure.  Lipid and cholesterol levels. These may be checked every 5 years, or more frequently if you are over 81 years old.  Skin check.  Lung cancer screening. You may have this screening every year starting at age 78 if you have a 30-pack-year history of smoking and currently smoke or have quit within the past 15 years.  Fecal occult blood test (FOBT) of the stool. You may have this test every year starting at age 65.  Flexible sigmoidoscopy or colonoscopy. You may have a sigmoidoscopy every 5 years or a colonoscopy  every 10 years starting at age 30.  Hepatitis C blood test.  Hepatitis B blood test.  Sexually transmitted disease (STD) testing.  Diabetes screening. This is done by checking your blood sugar (glucose) after you have not eaten for a while (fasting). You may have this done every 1-3 years.  Mammogram. This may be done every 1-2 years. Talk to your health care provider about when you should start having regular mammograms. This may depend on whether you have a family history of breast cancer.  BRCA-related cancer screening. This may be done if you have a family history of breast, ovarian, tubal, or peritoneal cancers.  Pelvic exam and Pap test. This may be done every 3 years starting at age 80. Starting at age 36, this may be done every 5 years if you have a Pap test in combination with an HPV test.  Bone density scan. This is done to screen for osteoporosis. You may have this scan if you are at high risk for osteoporosis.  Discuss your test results, treatment options, and if necessary, the need for more tests with your health care provider. Vaccines Your health care provider may recommend certain vaccines, such as:  Influenza vaccine. This is recommended every year.  Tetanus, diphtheria, and acellular pertussis (Tdap, Td) vaccine. You may need a Td booster every 10 years.  Varicella vaccine. You may need this if you have not been vaccinated.  Zoster vaccine. You may need this after age 5.  Measles, mumps, and rubella (MMR) vaccine. You may need at least one dose of MMR if you were born in  1955 or later. You may also need a second dose.  Pneumococcal 13-valent conjugate (PCV13) vaccine. You may need this if you have certain conditions and were not previously vaccinated.  Pneumococcal polysaccharide (PPSV23) vaccine. You may need one or two doses if you smoke cigarettes or if you have certain conditions.  Meningococcal vaccine. You may need this if you have certain  conditions.  Hepatitis A vaccine. You may need this if you have certain conditions or if you travel or work in places where you may be exposed to hepatitis A.  Hepatitis B vaccine. You may need this if you have certain conditions or if you travel or work in places where you may be exposed to hepatitis B.  Haemophilus influenzae type b (Hib) vaccine. You may need this if you have certain conditions.  Talk to your health care provider about which screenings and vaccines you need and how often you need them. This information is not intended to replace advice given to you by your health care provider. Make sure you discuss any questions you have with your health care provider. Document Released: 04/17/2015 Document Revised: 12/09/2015 Document Reviewed: 01/20/2015 Elsevier Interactive Patient Education  2018 Elsevier Inc.  

## 2017-11-23 NOTE — Progress Notes (Signed)
Subjective:     Sandy Green is a 57 y.o. female and is here for a comprehensive physical exam. The patient reports no problems.-- except occasionally she breaks out after working in the yard in the heat.  It goes away after 2-3 days  Pt father is with her She is requesting bcp to help with cramps  Social History   Socioeconomic History  . Marital status: Married    Spouse name: Not on file  . Number of children: Not on file  . Years of education: Not on file  . Highest education level: Not on file  Occupational History  . Not on file  Social Needs  . Financial resource strain: Not on file  . Food insecurity:    Worry: Not on file    Inability: Not on file  . Transportation needs:    Medical: Not on file    Non-medical: Not on file  Tobacco Use  . Smoking status: Never Smoker  . Smokeless tobacco: Never Used  Substance and Sexual Activity  . Alcohol use: Yes    Alcohol/week: 0.0 standard drinks    Comment: Rare  . Drug use: No  . Sexual activity: Yes    Partners: Male  Lifestyle  . Physical activity:    Days per week: Not on file    Minutes per session: Not on file  . Stress: Not on file  Relationships  . Social connections:    Talks on phone: Not on file    Gets together: Not on file    Attends religious service: Not on file    Active member of club or organization: Not on file    Attends meetings of clubs or organizations: Not on file    Relationship status: Not on file  . Intimate partner violence:    Fear of current or ex partner: Not on file    Emotionally abused: Not on file    Physically abused: Not on file    Forced sexual activity: Not on file  Other Topics Concern  . Not on file  Social History Narrative   Exercise-- no    Married,lives with husband.   Unemployed.   Health Maintenance  Topic Date Due  . HIV Screening  05/[birth year]/1977  . INFLUENZA VACCINE  11/02/2017  . MAMMOGRAM  08/23/2018 (Originally 05/27/2017)  . PAP SMEAR  03/22/2018  .  COLONOSCOPY  07/18/2021  . TETANUS/TDAP  07/15/2026  . Hepatitis C Screening  Completed    The following portions of the patient's history were reviewed and updated as appropriate:  She  has a past medical history of Allergy, Ankle fracture, left, Anxiety, Asthma, Chicken pox, Elbow fracture, Heart murmur, Hyperlipemia, and Hypertension. She does not have any pertinent problems on file. She  has a past surgical history that includes Elbow fracture surgery (Left) and Wisdom tooth extraction. Her family history includes Cancer in her maternal grandmother; Diabetes in her maternal grandmother; Heart disease in her father; Hypertension in her father and mother. She  reports that she has never smoked. She has never used smokeless tobacco. She reports that she drinks alcohol. She reports that she does not use drugs. She has a current medication list which includes the following prescription(s): albuterol, atorvastatin, beclomethasone, chlorpheniramine-hydrocodone, escitalopram, irbesartan, junel fe 1.5/30, levocetirizine dihydrochloride, montelukast, and atorvastatin, and the following Facility-Administered Medications: sodium chloride. Current Outpatient Medications on File Prior to Visit  Medication Sig Dispense Refill  . albuterol (PROVENTIL HFA;VENTOLIN HFA) 108 (90 BASE) MCG/ACT inhaler Inhale 1-2  puffs into the lungs every 6 (six) hours as needed for wheezing or shortness of breath. 1 Inhaler 0  . atorvastatin (LIPITOR) 40 MG tablet Take 1 tablet (40 mg total) by mouth daily. 30 tablet 2  . beclomethasone (QVAR REDIHALER) 80 MCG/ACT inhaler Inhale 2 puffs into the lungs 2 (two) times daily. 3 Inhaler 1  . chlorpheniramine-HYDROcodone (TUSSIONEX PENNKINETIC ER) 10-8 MG/5ML SUER Take 5 mLs by mouth at bedtime as needed for cough. 100 mL 0  . escitalopram (LEXAPRO) 10 MG tablet TAKE 1 TABLET BY MOUTH EVERY DAY 90 tablet 1  . irbesartan (AVAPRO) 150 MG tablet Take 1 tablet (150 mg total) by mouth  daily. 30 tablet 2  . JUNEL FE 1.5/30 1.5-30 MG-MCG tablet TAKE 1 TABLET BY MOUTH EVERY DAY 84 tablet 1  . Levocetirizine Dihydrochloride (XYZAL ALLERGY 24HR PO) Take 1 tablet by mouth daily.    . montelukast (SINGULAIR) 10 MG tablet TAKE 1 TABLET BY MOUTH EVERYDAY AT BEDTIME 90 tablet 1  . atorvastatin (LIPITOR) 20 MG tablet TAKE 1 TABLET BY MOUTH EVERY DAY (Patient not taking: Reported on 11/23/2017) 90 tablet 0   Current Facility-Administered Medications on File Prior to Visit  Medication Dose Route Frequency Provider Last Rate Last Dose  . 0.9 %  sodium chloride infusion  500 mL Intravenous Continuous Armbruster, Carlota Raspberry, MD       She is allergic to amlodipine; naproxen; penicillins; and tetracyclines & related..  Review of Systems Review of Systems  Constitutional: Negative for activity change, appetite change and fatigue.  HENT: Negative for hearing loss, congestion, tinnitus and ear discharge.  dentist q84m Eyes: Negative for visual disturbance (see optho q1y -- vision corrected to 20/20 with glasses).  Respiratory: Negative for cough, chest tightness and shortness of breath.   Cardiovascular: Negative for chest pain, palpitations and leg swelling.  Gastrointestinal: Negative for abdominal pain, diarrhea, constipation and abdominal distention.  Genitourinary: Negative for urgency, frequency, decreased urine volume and difficulty urinating.  Musculoskeletal: Negative for back pain, arthralgias and gait problem.  Skin: Negative for color change, pallor and rash.  Neurological: Negative for dizziness, light-headedness, numbness and headaches.  Hematological: Negative for adenopathy. Does not bruise/bleed easily.  Psychiatric/Behavioral: Negative for suicidal ideas, confusion, sleep disturbance, self-injury, dysphoric mood, decreased concentration and agitation.       Objective:    BP (!) 142/84 (BP Location: Right Arm, Patient Position: Sitting, Cuff Size: Small)   Pulse 80    Temp 98.3 F (36.8 C) (Oral)   Resp 16   Ht 5\' 6"  (1.676 m)   Wt 187 lb (84.8 kg)   SpO2 98%   BMI 30.18 kg/m  General appearance: alert, cooperative, appears stated age and no distress Head: Normocephalic, without obvious abnormality, atraumatic Eyes: conjunctivae/corneas clear. PERRL, EOM's intact. Fundi benign. Ears: normal TM's and external ear canals both ears Nose: Nares normal. Septum midline. Mucosa normal. No drainage or sinus tenderness. Throat: lips, mucosa, and tongue normal; teeth and gums normal Neck: no adenopathy, no carotid bruit, no JVD, supple, symmetrical, trachea midline and thyroid not enlarged, symmetric, no tenderness/mass/nodules Back: symmetric, no curvature. ROM normal. No CVA tenderness. Lungs: clear to auscultation bilaterally Breasts: normal appearance, no masses or tenderness Heart: regular rate and rhythm, S1, S2 normal, no murmur, click, rub or gallop Abdomen: soft, non-tender; bowel sounds normal; no masses,  no organomegaly Pelvic: deferred Extremities: extremities normal, atraumatic, no cyanosis or edema Pulses: 2+ and symmetric Skin: Skin color, texture, turgor normal. No rashes or lesions  Lymph nodes: Cervical, supraclavicular, and axillary nodes normal. Neurologic: Alert and oriented X 3, normal strength and tone. Normal symmetric reflexes. Normal coordination and gait    Assessment:    Healthy female exam.      Plan:    ghm utd Check labs  See After Visit Summary for Counseling Recommendations

## 2017-11-23 NOTE — Addendum Note (Signed)
Addended by: Kem Boroughs D on: 11/23/2017 03:40 PM   Modules accepted: Orders

## 2017-11-29 ENCOUNTER — Other Ambulatory Visit: Payer: Self-pay | Admitting: Family Medicine

## 2017-12-01 ENCOUNTER — Other Ambulatory Visit: Payer: Self-pay

## 2017-12-01 DIAGNOSIS — E785 Hyperlipidemia, unspecified: Secondary | ICD-10-CM

## 2017-12-01 DIAGNOSIS — I10 Essential (primary) hypertension: Secondary | ICD-10-CM

## 2017-12-07 ENCOUNTER — Ambulatory Visit (HOSPITAL_COMMUNITY)
Admission: EM | Admit: 2017-12-07 | Discharge: 2017-12-07 | Disposition: A | Discharge status: Home / Self Care | Payer: Managed Care, Other (non HMO) | Attending: Family Medicine | Admitting: Family Medicine

## 2017-12-07 ENCOUNTER — Encounter (HOSPITAL_COMMUNITY): Payer: Self-pay | Admitting: Emergency Medicine

## 2017-12-07 DIAGNOSIS — B9789 Other viral agents as the cause of diseases classified elsewhere: Secondary | ICD-10-CM

## 2017-12-07 DIAGNOSIS — J329 Chronic sinusitis, unspecified: Secondary | ICD-10-CM

## 2017-12-07 MED ORDER — METHYLPREDNISOLONE ACETATE 40 MG/ML IJ SUSP
INTRAMUSCULAR | Status: AC
  Filled 2017-12-07: qty 1

## 2017-12-07 MED ORDER — METHYLPREDNISOLONE ACETATE 40 MG/ML IJ SUSP
40.0000 mg | Freq: Once | INTRAMUSCULAR | Status: AC
  Administered 2017-12-07: 40 mg via INTRAMUSCULAR

## 2017-12-07 NOTE — ED Triage Notes (Signed)
Pt reporst cough, congestion, facial pain, right ear pain for 2 days.

## 2017-12-07 NOTE — ED Provider Notes (Signed)
[facility]    CSN: [id number] Arrival date & time: 12/07/17  1059     History   Chief Complaint Chief Complaint  Patient presents with  . URI    HPI Sandy Green is a 57 y.o. female.   Pt is a 57 year old female with PMH of allergies, asthma, anxiety, HTN, hyperlipidemia. She presents with 3 days of nasal congestion, runny nose, sore throat facial tenderness and swelling. She has had a slight cough but the the sinus pressure and congestion is the worse. She has had some body aches and chills. She takes xyzal everyday for allergies. She is unable to take nasal sprays due to nose bleeds. Hx of asthma but well controlled for years. She denies any chest pain, SOB.   She does not smoke.   ROS per HPI      Past Medical History:  Diagnosis Date  . Allergy   . Ankle fracture, left   . Anxiety   . Asthma   . Chicken pox   . Elbow fracture   . Heart murmur   . Hyperlipemia   . Hypertension     Patient Active Problem List   Diagnosis Date Noted  . Left wrist pain 09/21/2015  . Pain of left heel 09/21/2015  . Vaginitis and vulvovaginitis 03/23/2015  . Rash and nonspecific skin eruption 01/01/2015  . Essential hypertension 06/16/2014  . Conjunctivitis 04/09/2014  . Hyperlipidemia 02/28/2013  . Multiple allergies 02/28/2013  . Asthma, mild intermittent 02/28/2013  . Obesity (BMI 30-39.9) 02/27/2013    Past Surgical History:  Procedure Laterality Date  . ELBOW FRACTURE SURGERY Left   . WISDOM TOOTH EXTRACTION      OB History   None      Home Medications    Prior to Admission medications   Medication Sig Start Date End Date Taking? Authorizing Provider  albuterol (PROVENTIL HFA;VENTOLIN HFA) 108 (90 BASE) MCG/ACT inhaler Inhale 1-2 puffs into the lungs every 6 (six) hours as needed for wheezing or shortness of breath. 05/03/14   Harden Mo, MD  atorvastatin (LIPITOR) 40 MG tablet TAKE 1 TABLET BY MOUTH EVERY DAY 12/01/17   Carollee Herter, Alferd Apa, DO   beclomethasone (QVAR REDIHALER) 80 MCG/ACT inhaler Inhale 2 puffs into the lungs 2 (two) times daily. 11/14/17   Ann Held, DO  chlorpheniramine-HYDROcodone (TUSSIONEX PENNKINETIC ER) 10-8 MG/5ML SUER Take 5 mLs by mouth at bedtime as needed for cough. 06/28/17   Jaynee Eagles, PA-C  escitalopram (LEXAPRO) 10 MG tablet TAKE 1 TABLET BY MOUTH EVERY DAY 10/27/17   Carollee Herter, Alferd Apa, DO  irbesartan (AVAPRO) 150 MG tablet Take 1 tablet (150 mg total) by mouth daily. 11/10/17   Carollee Herter, Alferd Apa, DO  JUNEL FE 1.5/30 1.5-30 MG-MCG tablet TAKE 1 TABLET BY MOUTH EVERY DAY 11/22/17   Carollee Herter, Alferd Apa, DO  Levocetirizine Dihydrochloride (XYZAL ALLERGY 24HR PO) Take 1 tablet by mouth daily.    [provider]  montelukast (SINGULAIR) 10 MG tablet TAKE 1 TABLET BY MOUTH EVERYDAY AT BEDTIME 09/19/17   Ann Held, DO    Family History Family History  Problem Relation Age of Onset  . Hypertension Mother   . Hypertension Father   . Heart disease Father        cabg.[id number]  . Diabetes Maternal Grandmother   . Cancer Maternal Grandmother        Unsure of kind? Breast  . Colon cancer Neg Hx   .  Esophageal cancer Neg Hx   . Rectal cancer Neg Hx   . Stomach cancer Neg Hx     Social History Social History   Tobacco Use  . Smoking status: Never Smoker  . Smokeless tobacco: Never Used  Substance Use Topics  . Alcohol use: Yes    Alcohol/week: 0.0 standard drinks    Comment: Rare  . Drug use: No     Allergies   Amlodipine; Naproxen; Penicillins; and Tetracyclines & related   Review of Systems Review of Systems   Physical Exam Triage Vital Signs ED Triage Vitals  Enc Vitals Group     BP 12/07/17 1116 (!) 142/96     Pulse Rate 12/07/17 1116 92     Resp 12/07/17 1116 16     Temp 12/07/17 1116 98.4 F (36.9 C)     Temp Source 12/07/17 1116 Oral     SpO2 12/07/17 1116 98 %     Weight 12/07/17 1122 188 lb (85.3 kg)     Height --      Head Circumference --       Peak Flow --      Pain Score 12/07/17 1122 2     Pain Loc --      Pain Edu? --      Excl. in [place]? --    No data found.  Updated Vital Signs BP (!) 142/96 (BP Location: Left Arm)   Pulse 92   Temp 98.4 F (36.9 C) (Oral)   Resp 16   Wt 188 lb (85.3 kg)   SpO2 98%   BMI 30.34 kg/m   Visual Acuity Right Eye Distance:   Left Eye Distance:   Bilateral Distance:    Right Eye Near:   Left Eye Near:    Bilateral Near:     Physical Exam  Constitutional: She is oriented to person, place, and time. She appears well-developed and well-nourished.  Very pleasant. Non toxic or ill appearing.     HENT:  Head: Normocephalic and atraumatic.  Bilateral TMs normal.  External ears normal.  Without posterior oropharyngeal erythema, tonsillar swelling or exudates. No lesions.  No lymphadenopathy.  Tenderness and swelling to maxillary sinuses.     Eyes: Conjunctivae are normal.  Neck: Normal range of motion.  Cardiovascular: Normal rate and regular rhythm.  Pulmonary/Chest:  Lungs clear in all fields. No dyspnea or distress. No retractions or nasal flaring.     Musculoskeletal: Normal range of motion.  Neurological: She is alert and oriented to person, place, and time.  Skin: Skin is warm and dry.  Psychiatric: She has a normal mood and affect.  Nursing note and vitals reviewed.    UC Treatments / Results  Labs (all labs ordered are listed, but only abnormal results are displayed) Labs Reviewed - No data to display  EKG None  Radiology No results found.  Procedures Procedures (including critical care time)  Medications Ordered in UC Medications  methylPREDNISolone acetate (DEPO-MEDROL) injection 40 mg (40 mg Intramuscular Given 12/07/17 1155)    Initial Impression / Assessment and Plan / UC Course  I have reviewed the triage vital signs and the nursing notes.  Pertinent labs & imaging results that were available during my care of the patient were reviewed by  me and considered in my medical decision making (see chart for details).     Most likely viral sinusitis. We will give her a steroid injection in clinic to help with the symptoms.  Continue OTC treatment for  symptoms.  Follow up as needed for continued or worsening symptoms   Final Clinical Impressions(s) / UC Diagnoses   Final diagnoses:  Viral sinusitis     Discharge Instructions     It was nice meeting you!!  We will give you a steroid injection here in clinic to help with the inflammation in your sinuses.  You can continue over-the-counter symptomatic treatment. Let us know if you are not feeling better in the next couple of days. Follow up as needed for continued or worsening symptoms     ED Prescriptions    None     Controlled Substance Prescriptions [place] Controlled Substance Registry consulted? no   Orvan July, NP 12/07/17 1219

## 2017-12-07 NOTE — Discharge Instructions (Addendum)
It was nice meeting you!!  We will give you a steroid injection here in clinic to help with the inflammation in your sinuses.  You can continue over-the-counter symptomatic treatment. Let us know if you are not feeling better in the next couple of days. Follow up as needed for continued or worsening symptoms

## 2018-01-03 ENCOUNTER — Encounter: Payer: Self-pay | Admitting: *Deleted

## 2018-01-25 ENCOUNTER — Ambulatory Visit (INDEPENDENT_AMBULATORY_CARE_PROVIDER_SITE_OTHER): Payer: Managed Care, Other (non HMO)

## 2018-01-25 DIAGNOSIS — Z23 Encounter for immunization: Secondary | ICD-10-CM

## 2018-02-07 ENCOUNTER — Other Ambulatory Visit: Payer: Self-pay | Admitting: Family Medicine

## 2018-02-19 ENCOUNTER — Other Ambulatory Visit (INDEPENDENT_AMBULATORY_CARE_PROVIDER_SITE_OTHER): Payer: Managed Care, Other (non HMO)

## 2018-02-19 DIAGNOSIS — I10 Essential (primary) hypertension: Secondary | ICD-10-CM

## 2018-02-19 DIAGNOSIS — E785 Hyperlipidemia, unspecified: Secondary | ICD-10-CM | POA: Diagnosis not present

## 2018-02-19 LAB — LIPID PANEL
CHOL/HDL RATIO: 3
Cholesterol: 171 mg/dL (ref 0–200)
HDL: 52 mg/dL (ref >39.00)
LDL Cholesterol: 103 mg/dL — ABNORMAL HIGH (ref 0–99)
NONHDL: 119.34
TRIGLYCERIDES: 83 mg/dL (ref 0.0–149.0)
VLDL: 16.6 mg/dL (ref 0.0–40.0)

## 2018-02-19 LAB — COMPREHENSIVE METABOLIC PANEL
ALT: 13 U/L (ref 0–35)
AST: 18 U/L (ref 0–37)
Albumin: 3.3 g/dL — ABNORMAL LOW (ref 3.5–5.2)
Alkaline Phosphatase: 67 U/L (ref 39–117)
BILIRUBIN TOTAL: 1.1 mg/dL (ref 0.2–1.2)
BUN: 17 mg/dL (ref 6–23)
CO2: 28 meq/L (ref 19–32)
CREATININE: 1.07 mg/dL (ref 0.40–1.20)
Calcium: 8.4 mg/dL (ref 8.4–10.5)
Chloride: 105 mEq/L (ref 96–112)
GFR: 56.43 mL/min — AB (ref >60.00)
GLUCOSE: 85 mg/dL (ref 70–99)
Potassium: 4.2 mEq/L (ref 3.5–5.1)
Sodium: 140 mEq/L (ref 135–145)
TOTAL PROTEIN: 6.4 g/dL (ref 6.0–8.3)

## 2018-03-17 ENCOUNTER — Other Ambulatory Visit: Payer: Self-pay | Admitting: Family Medicine

## 2018-04-16 ENCOUNTER — Encounter: Payer: Self-pay | Admitting: Family Medicine

## 2018-04-17 NOTE — Telephone Encounter (Signed)
Erythromycin opth 1 cm ribbon in eye 6x a day for 7 days--- see eye dr if no better

## 2018-04-20 MED ORDER — ERYTHROMYCIN 5 MG/GM OP OINT: TOPICAL_OINTMENT | OPHTHALMIC | 0 refills | Status: DC

## 2018-04-20 NOTE — Addendum Note (Signed)
Addended by: Kem Boroughs D on: 04/20/2018 08:05 AM   Modules accepted: Orders

## 2018-04-22 ENCOUNTER — Other Ambulatory Visit: Payer: Self-pay | Admitting: Family Medicine

## 2018-04-22 DIAGNOSIS — F419 Anxiety disorder, unspecified: Secondary | ICD-10-CM

## 2018-05-07 ENCOUNTER — Other Ambulatory Visit: Payer: Self-pay | Admitting: Family Medicine

## 2018-05-13 ENCOUNTER — Other Ambulatory Visit: Payer: Self-pay | Admitting: Family Medicine

## 2018-05-16 ENCOUNTER — Other Ambulatory Visit: Payer: Self-pay | Admitting: Family Medicine

## 2018-05-16 MED ORDER — IRBESARTAN 150 MG PO TABS: 150.0000 mg | ORAL_TABLET | Freq: Every day | ORAL | 1 refills | Status: DC

## 2018-05-16 MED ORDER — ATORVASTATIN CALCIUM 40 MG PO TABS: 40.0000 mg | ORAL_TABLET | Freq: Every day | ORAL | 1 refills | Status: DC

## 2018-05-16 MED ORDER — BECLOMETHASONE DIPROP HFA 80 MCG/ACT IN AERB: INHALATION_SPRAY | RESPIRATORY_TRACT | 1 refills | Status: DC

## 2018-05-16 NOTE — Telephone Encounter (Signed)
Copied from [place] [phone]. Topic: Quick Communication - Rx Refill/Question >> May 16, 2018  2:05 PM Windy Kalata wrote: Medication: atorvastatin (LIPITOR) 40 MG tablet, irbesartan (AVAPRO) 150 MG tablet, QVAR REDIHALER 80 MCG/ACT inhaler    Has the patient contacted their pharmacy? No. (Agent: If no, request that the patient contact the pharmacy for the refill.) (Agent: If yes, when and what did the pharmacy advise?) [id number] [place], [place] - [address] [phone] (Phone) [phone] (Fax)   Preferred Pharmacy (with phone number or street name):   Agent: Please be advised that RX refills may take up to 3 business days. We ask that you follow-up with your pharmacy.

## 2018-05-28 ENCOUNTER — Encounter: Payer: Self-pay | Admitting: Family Medicine

## 2018-05-28 ENCOUNTER — Ambulatory Visit: Payer: Managed Care, Other (non HMO) | Admitting: Family Medicine

## 2018-05-28 VITALS — BP 132/84 | HR 87 | Ht 66.0 in | Wt 187.0 lb

## 2018-05-28 DIAGNOSIS — F419 Anxiety disorder, unspecified: Secondary | ICD-10-CM | POA: Diagnosis not present

## 2018-05-28 DIAGNOSIS — I10 Essential (primary) hypertension: Secondary | ICD-10-CM

## 2018-05-28 DIAGNOSIS — Z889 Allergy status to unspecified drugs, medicaments and biological substances status: Secondary | ICD-10-CM

## 2018-05-28 DIAGNOSIS — E785 Hyperlipidemia, unspecified: Secondary | ICD-10-CM | POA: Diagnosis not present

## 2018-05-28 NOTE — Progress Notes (Signed)
Patient ID: Sandy Green, female    DOB: March 22, 1961  Age: 58 y.o. MRN: [id number]    Subjective:  Subjective  HPI Sandy Green presents for f/u bp and cholesterol , allergies and anxiety No complaints.   Review of Systems  Constitutional: Negative for appetite change, diaphoresis, fatigue and unexpected weight change.  Eyes: Negative for pain, redness and visual disturbance.  Respiratory: Negative for cough, chest tightness, shortness of breath and wheezing.   Cardiovascular: Negative for chest pain, palpitations and leg swelling.  Endocrine: Negative for cold intolerance, heat intolerance, polydipsia, polyphagia and polyuria.  Genitourinary: Negative for difficulty urinating, dysuria and frequency.  Neurological: Negative for dizziness, light-headedness, numbness and headaches.  Psychiatric/Behavioral: Negative for self-injury, sleep disturbance and suicidal ideas. The patient is not nervous/anxious.     History Past Medical History:  Diagnosis Date  . Allergy   . Ankle fracture, left   . Anxiety   . Asthma   . Chicken pox   . Elbow fracture   . Heart murmur   . Hyperlipemia   . Hypertension     She has a past surgical history that includes Elbow fracture surgery (Left) and Wisdom tooth extraction.   Her family history includes Cancer in her maternal grandmother; Diabetes in her maternal grandmother; Heart disease in her father; Hypertension in her father and mother.She reports that she has never smoked. She has never used smokeless tobacco. She reports current alcohol use. She reports that she does not use drugs.  Current Outpatient Medications on File Prior to Visit  Medication Sig Dispense Refill  . albuterol (PROVENTIL HFA;VENTOLIN HFA) 108 (90 BASE) MCG/ACT inhaler Inhale 1-2 puffs into the lungs every 6 (six) hours as needed for wheezing or shortness of breath. 1 Inhaler 0  . atorvastatin (LIPITOR) 40 MG tablet Take 1 tablet (40 mg total) by mouth daily. 90 tablet 1  .  beclomethasone (QVAR REDIHALER) 80 MCG/ACT inhaler TAKE 2 PUFFS BY MOUTH TWICE A DAY 31.8 g 1  . chlorpheniramine-HYDROcodone (TUSSIONEX PENNKINETIC ER) 10-8 MG/5ML SUER Take 5 mLs by mouth at bedtime as needed for cough. 100 mL 0  . escitalopram (LEXAPRO) 10 MG tablet TAKE 1 TABLET BY MOUTH EVERY DAY 90 tablet 1  . irbesartan (AVAPRO) 150 MG tablet Take 1 tablet (150 mg total) by mouth daily. 90 tablet 1  . JUNEL FE 1.5/30 1.5-30 MG-MCG tablet TAKE 1 TABLET BY MOUTH EVERY DAY 84 tablet 1  . Levocetirizine Dihydrochloride (XYZAL ALLERGY 24HR PO) Take 1 tablet by mouth daily.    . montelukast (SINGULAIR) 10 MG tablet TAKE 1 TABLET BY MOUTH EVERYDAY AT BEDTIME 90 tablet 1   Current Facility-Administered Medications on File Prior to Visit  Medication Dose Route Frequency Provider Last Rate Last Dose  . 0.9 %  sodium chloride infusion  500 mL Intravenous Continuous Armbruster, Carlota Raspberry, MD         Objective:  Objective  Physical Exam Vitals signs and nursing note reviewed.  Constitutional:      Appearance: She is well-developed.  HENT:     Head: Normocephalic and atraumatic.  Eyes:     Conjunctiva/sclera: Conjunctivae normal.  Neck:     Musculoskeletal: Normal range of motion and neck supple.     Thyroid: No thyromegaly.     Vascular: No carotid bruit or JVD.  Cardiovascular:     Rate and Rhythm: Normal rate and regular rhythm.     Heart sounds: Normal heart sounds. No murmur.  Pulmonary:  Effort: Pulmonary effort is normal. No respiratory distress.     Breath sounds: Normal breath sounds. No wheezing or rales.  Chest:     Chest wall: No tenderness.  Neurological:     Mental Status: She is alert and oriented to person, place, and time.  Psychiatric:        Attention and Perception: Attention and perception normal.        Mood and Affect: Mood and affect normal. Mood is not anxious or depressed.        Speech: Speech normal.        Behavior: Behavior normal.        Thought  Content: Thought content normal.        Cognition and Memory: Cognition and memory normal.        Judgment: Judgment normal.    BP 132/84   Pulse 87   Ht 5\' 6"  (1.676 m)   Wt 187 lb (84.8 kg)   SpO2 98%   BMI 30.18 kg/m  Wt Readings from Last 3 Encounters:  05/28/18 187 lb (84.8 kg)  12/07/17 188 lb (85.3 kg)  11/23/17 187 lb (84.8 kg)     Lab Results  Component Value Date   WBC 6.7 11/23/2017   HGB 13.2 11/23/2017   HCT 39.8 11/23/2017   PLT 280.0 11/23/2017   GLUCOSE 85 02/19/2018   CHOL 171 02/19/2018   TRIG 83.0 02/19/2018   HDL 52.00 02/19/2018   LDLCALC 103 (H) 02/19/2018   ALT 13 02/19/2018   AST 18 02/19/2018   NA 140 02/19/2018   K 4.2 02/19/2018   CL 105 02/19/2018   CREATININE 1.07 02/19/2018   BUN 17 02/19/2018   CO2 28 02/19/2018   TSH 0.89 11/23/2017    No results found.   Assessment & Plan:  Plan  I have discontinued Sandy Green erythromycin. I am also having her maintain her albuterol, Levocetirizine Dihydrochloride (XYZAL ALLERGY 24HR PO), chlorpheniramine-HYDROcodone, montelukast, escitalopram, JUNEL FE 1.5/30, atorvastatin, irbesartan, and beclomethasone. We will continue to administer sodium chloride.  No orders of the defined types were placed in this encounter.   Problem List Items Addressed This Visit      Unprioritized   Anxiety    con't meds stable      Essential hypertension - Primary    Well controlled, no changes to meds. Encouraged heart healthy diet such as the DASH diet and exercise as tolerated.       Relevant Orders   Lipid panel   Comprehensive metabolic panel   Hyperlipidemia    Tolerating statin, encouraged heart healthy diet, avoid trans fats, minimize simple carbs and saturated fats. Increase exercise as tolerated      Relevant Orders   Lipid panel   Comprehensive metabolic panel   Multiple allergies    Stable con't meds         Follow-up: Return in about 6 months (around 11/26/2018) for  hypertension, hyperlipidemia.  Ann Held, DO

## 2018-05-28 NOTE — Assessment & Plan Note (Signed)
Well controlled, no changes to meds. Encouraged heart healthy diet such as the DASH diet and exercise as tolerated.  °

## 2018-05-28 NOTE — Assessment & Plan Note (Signed)
Stable con't meds 

## 2018-05-28 NOTE — Patient Instructions (Signed)
DASH Eating Plan  DASH stands for "Dietary Approaches to Stop Hypertension." The DASH eating plan is a healthy eating plan that has been shown to reduce high blood pressure (hypertension). It may also reduce your risk for type 2 diabetes, heart disease, and stroke. The DASH eating plan may also help with weight loss.  What are tips for following this plan?    General guidelines   Avoid eating more than 2,300 mg (milligrams) of salt (sodium) a day. If you have hypertension, you may need to reduce your sodium intake to 1,500 mg a day.   Limit alcohol intake to no more than 1 drink a day for nonpregnant women and 2 drinks a day for men. One drink equals 12 oz of beer, 5 oz of wine, or 1 oz of hard liquor.   Work with your health care provider to maintain a healthy body weight or to lose weight. Ask what an ideal weight is for you.   Get at least 30 minutes of exercise that causes your heart to beat faster (aerobic exercise) most days of the week. Activities may include walking, swimming, or biking.   Work with your health care provider or diet and nutrition specialist (dietitian) to adjust your eating plan to your individual calorie needs.  Reading food labels     Check food labels for the amount of sodium per serving. Choose foods with less than 5 percent of the Daily Value of sodium. Generally, foods with less than 300 mg of sodium per serving fit into this eating plan.   To find whole grains, look for the word "whole" as the first word in the ingredient list.  Shopping   Buy products labeled as "low-sodium" or "no salt added."   Buy fresh foods. Avoid canned foods and premade or frozen meals.  Cooking   Avoid adding salt when cooking. Use salt-free seasonings or herbs instead of table salt or sea salt. Check with your health care provider or pharmacist before using salt substitutes.   Do not fry foods. Cook foods using healthy methods such as baking, boiling, grilling, and broiling instead.   Cook with  heart-healthy oils, such as olive, canola, soybean, or sunflower oil.  Meal planning   Eat a balanced diet that includes:  ? 5 or more servings of fruits and vegetables each day. At each meal, try to fill half of your plate with fruits and vegetables.  ? Up to 6-8 servings of whole grains each day.  ? Less than 6 oz of lean meat, poultry, or fish each day. A 3-oz serving of meat is about the same size as a deck of cards. One egg equals 1 oz.  ? 2 servings of low-fat dairy each day.  ? A serving of nuts, seeds, or beans 5 times each week.  ? Heart-healthy fats. Healthy fats called Omega-3 fatty acids are found in foods such as flaxseeds and coldwater fish, like sardines, salmon, and mackerel.   Limit how much you eat of the following:  ? Canned or prepackaged foods.  ? Food that is high in trans fat, such as fried foods.  ? Food that is high in saturated fat, such as fatty meat.  ? Sweets, desserts, sugary drinks, and other foods with added sugar.  ? Full-fat dairy products.   Do not salt foods before eating.   Try to eat at least 2 vegetarian meals each week.   Eat more home-cooked food and less restaurant, buffet, and fast food.     When eating at a restaurant, ask that your food be prepared with less salt or no salt, if possible.  What foods are recommended?  The items listed may not be a complete list. Talk with your dietitian about what dietary choices are best for you.  Grains  Whole-grain or whole-wheat bread. Whole-grain or whole-wheat pasta. [place] rice. Oatmeal. Quinoa. Bulgur. Whole-grain and low-sodium cereals. Pita bread. Low-fat, low-sodium crackers. Whole-wheat flour tortillas.  Vegetables  Fresh or frozen vegetables (raw, steamed, roasted, or grilled). Low-sodium or reduced-sodium tomato and vegetable juice. Low-sodium or reduced-sodium tomato sauce and tomato paste. Low-sodium or reduced-sodium canned vegetables.  Fruits  All fresh, dried, or frozen fruit. Canned fruit in natural juice (without  added sugar).  Meat and other protein foods  Skinless chicken or turkey. Ground chicken or turkey. Pork with fat trimmed off. Fish and seafood. Egg whites. Dried beans, peas, or lentils. Unsalted nuts, nut butters, and seeds. Unsalted canned beans. Lean cuts of beef with fat trimmed off. Low-sodium, lean deli meat.  Dairy  Low-fat (1%) or fat-free (skim) milk. Fat-free, low-fat, or reduced-fat cheeses. Nonfat, low-sodium ricotta or cottage cheese. Low-fat or nonfat yogurt. Low-fat, low-sodium cheese.  Fats and oils  Soft margarine without trans fats. Vegetable oil. Low-fat, reduced-fat, or light mayonnaise and salad dressings (reduced-sodium). Canola, safflower, olive, soybean, and sunflower oils. Avocado.  Seasoning and other foods  Herbs. Spices. Seasoning mixes without salt. Unsalted popcorn and pretzels. Fat-free sweets.  What foods are not recommended?  The items listed may not be a complete list. Talk with your dietitian about what dietary choices are best for you.  Grains  Baked goods made with fat, such as croissants, muffins, or some breads. Dry pasta or rice meal packs.  Vegetables  Creamed or fried vegetables. Vegetables in a cheese sauce. Regular canned vegetables (not low-sodium or reduced-sodium). Regular canned tomato sauce and paste (not low-sodium or reduced-sodium). Regular tomato and vegetable juice (not low-sodium or reduced-sodium). Pickles. Olives.  Fruits  Canned fruit in a light or heavy syrup. Fried fruit. Fruit in cream or butter sauce.  Meat and other protein foods  Fatty cuts of meat. Ribs. Fried meat. Bacon. Sausage. Bologna and other processed lunch meats. Salami. Fatback. Hotdogs. Bratwurst. Salted nuts and seeds. Canned beans with added salt. Canned or smoked fish. Whole eggs or egg yolks. Chicken or turkey with skin.  Dairy  Whole or 2% milk, cream, and half-and-half. Whole or full-fat cream cheese. Whole-fat or sweetened yogurt. Full-fat cheese. Nondairy creamers. Whipped toppings.  Processed cheese and cheese spreads.  Fats and oils  Butter. Stick margarine. Lard. Shortening. Ghee. Bacon fat. Tropical oils, such as coconut, palm kernel, or palm oil.  Seasoning and other foods  Salted popcorn and pretzels. Onion salt, garlic salt, seasoned salt, table salt, and sea salt. Worcestershire sauce. Tartar sauce. Barbecue sauce. Teriyaki sauce. Soy sauce, including reduced-sodium. Steak sauce. Canned and packaged gravies. Fish sauce. Oyster sauce. Cocktail sauce. Horseradish that you find on the shelf. Ketchup. Mustard. Meat flavorings and tenderizers. Bouillon cubes. Hot sauce and Tabasco sauce. Premade or packaged marinades. Premade or packaged taco seasonings. Relishes. Regular salad dressings.  Where to find more information:   National Heart, Lung, and Blood Institute: www.nhlbi.nih.gov   American Heart Association: www.heart.org  Summary   The DASH eating plan is a healthy eating plan that has been shown to reduce high blood pressure (hypertension). It may also reduce your risk for type 2 diabetes, heart disease, and stroke.   With the   DASH eating plan, you should limit salt (sodium) intake to 2,300 mg a day. If you have hypertension, you may need to reduce your sodium intake to 1,500 mg a day.   When on the DASH eating plan, aim to eat more fresh fruits and vegetables, whole grains, lean proteins, low-fat dairy, and heart-healthy fats.   Work with your health care provider or diet and nutrition specialist (dietitian) to adjust your eating plan to your individual calorie needs.  This information is not intended to replace advice given to you by your health care provider. Make sure you discuss any questions you have with your health care provider.  Document Released: 03/10/2011 Document Revised: 03/14/2016 Document Reviewed: 03/14/2016  Elsevier Interactive Patient Education  2019 [employer]

## 2018-05-28 NOTE — Assessment & Plan Note (Signed)
con't meds stable 

## 2018-05-28 NOTE — Assessment & Plan Note (Signed)
Tolerating statin, encouraged heart healthy diet, avoid trans fats, minimize simple carbs and saturated fats. Increase exercise as tolerated 

## 2018-05-30 ENCOUNTER — Other Ambulatory Visit (INDEPENDENT_AMBULATORY_CARE_PROVIDER_SITE_OTHER): Payer: Managed Care, Other (non HMO)

## 2018-05-30 DIAGNOSIS — I10 Essential (primary) hypertension: Secondary | ICD-10-CM

## 2018-05-30 DIAGNOSIS — E785 Hyperlipidemia, unspecified: Secondary | ICD-10-CM | POA: Diagnosis not present

## 2018-05-30 LAB — COMPREHENSIVE METABOLIC PANEL
ALT: 10 U/L (ref 0–35)
AST: 14 U/L (ref 0–37)
Albumin: 3.1 g/dL — ABNORMAL LOW (ref 3.5–5.2)
Alkaline Phosphatase: 69 U/L (ref 39–117)
BUN: 11 mg/dL (ref 6–23)
CO2: 26 mEq/L (ref 19–32)
CREATININE: 1.13 mg/dL (ref 0.40–1.20)
Calcium: 8.1 mg/dL — ABNORMAL LOW (ref 8.4–10.5)
Chloride: 106 mEq/L (ref 96–112)
GFR: 49.8 mL/min — ABNORMAL LOW (ref >60.00)
Glucose, Bld: 81 mg/dL (ref 70–99)
Potassium: 4 mEq/L (ref 3.5–5.1)
Sodium: 139 mEq/L (ref 135–145)
Total Bilirubin: 0.8 mg/dL (ref 0.2–1.2)
Total Protein: 6.1 g/dL (ref 6.0–8.3)

## 2018-05-30 LAB — LIPID PANEL
Cholesterol: 160 mg/dL (ref 0–200)
HDL: 48.1 mg/dL (ref >39.00)
LDL CALC: 96 mg/dL (ref 0–99)
NonHDL: 111.52
Total CHOL/HDL Ratio: 3
Triglycerides: 79 mg/dL (ref 0.0–149.0)
VLDL: 15.8 mg/dL (ref 0.0–40.0)

## 2018-06-03 ENCOUNTER — Other Ambulatory Visit: Payer: Self-pay | Admitting: Family Medicine

## 2018-06-03 DIAGNOSIS — N289 Disorder of kidney and ureter, unspecified: Secondary | ICD-10-CM

## 2018-06-07 ENCOUNTER — Encounter: Payer: Self-pay | Admitting: *Deleted

## 2018-08-10 ENCOUNTER — Encounter: Payer: Self-pay | Admitting: Family Medicine

## 2018-08-10 DIAGNOSIS — F419 Anxiety disorder, unspecified: Secondary | ICD-10-CM

## 2018-08-13 MED ORDER — IRBESARTAN 150 MG PO TABS: 150.0000 mg | ORAL_TABLET | Freq: Every day | ORAL | 1 refills | Status: DC

## 2018-08-13 MED ORDER — ATORVASTATIN CALCIUM 40 MG PO TABS: 40.0000 mg | ORAL_TABLET | Freq: Every day | ORAL | 1 refills | Status: DC

## 2018-08-13 MED ORDER — NORETHIN ACE-ETH ESTRAD-FE 1.5-30 MG-MCG PO TABS: 1.0000 | ORAL_TABLET | Freq: Every day | ORAL | 1 refills | Status: DC

## 2018-08-13 MED ORDER — ESCITALOPRAM OXALATE 10 MG PO TABS: 10.0000 mg | ORAL_TABLET | Freq: Every day | ORAL | 1 refills | Status: DC

## 2018-08-13 MED ORDER — MONTELUKAST SODIUM 10 MG PO TABS: ORAL_TABLET | ORAL | 1 refills | Status: DC

## 2018-11-26 ENCOUNTER — Encounter: Payer: Self-pay | Admitting: Family Medicine

## 2018-11-26 ENCOUNTER — Other Ambulatory Visit: Payer: Self-pay

## 2018-11-26 ENCOUNTER — Ambulatory Visit (INDEPENDENT_AMBULATORY_CARE_PROVIDER_SITE_OTHER): Payer: Managed Care, Other (non HMO) | Admitting: Family Medicine

## 2018-11-26 DIAGNOSIS — E785 Hyperlipidemia, unspecified: Secondary | ICD-10-CM

## 2018-11-26 DIAGNOSIS — I10 Essential (primary) hypertension: Secondary | ICD-10-CM | POA: Diagnosis not present

## 2018-11-26 MED ORDER — CARVEDILOL 6.25 MG PO TABS: 6.2500 mg | ORAL_TABLET | Freq: Two times a day (BID) | ORAL | 3 refills | Status: DC

## 2018-11-26 NOTE — Progress Notes (Signed)
Virtual Visit via Video Note  I connected with Sandy Green on 11/26/18 at  1:00 PM EDT by a video enabled telemedicine application and verified that I am speaking with the correct person using two identifiers.  Location: Patient: home  Provider: office    I discussed the limitations of evaluation and management by telemedicine and the availability of in person appointments. The patient expressed understanding and agreed to proceed.  History of Present Illness: We had to transition to tele due to webcam not working  Pt is home ---f/u bp and cholesterol bp has been running very high Some headaches  No chest pain   Observations/Objective: 162/103  112    Afebrile  Pt in NAD  Assessment and Plan: 1. Essential hypertension Poorly controlled will alter medications, encouraged DASH diet, minimize caffeine and obtain adequate sleep. Report concerning symptoms and follow up as directed and as needed==== con't avapro and add coreg  F/u later this week or next  Call if any problems  - Lipid panel; Future - Comprehensive metabolic panel; Future - carvedilol (COREG) 6.25 MG tablet; Take 1 tablet (6.25 mg total) by mouth 2 (two) times daily with a meal.  Dispense: 60 tablet; Refill: 3  2. Hyperlipidemia, unspecified hyperlipidemia type Tolerating statin, encouraged heart healthy diet, avoid trans fats, minimize simple carbs and saturated fats. Increase exercise as tolerated - Lipid panel; Future - Comprehensive metabolic panel; Future   Follow Up Instructions:    I discussed the assessment and treatment plan with the patient. The patient was provided an opportunity to ask questions and all were answered. The patient agreed with the plan and demonstrated an understanding of the instructions.   The patient was advised to call back or seek an in-person evaluation if the symptoms worsen or if the condition fails to improve as anticipated.  I provided 15 minutes of non-face-to-face time during  this encounter.   Ann Held, DO

## 2018-12-03 ENCOUNTER — Encounter: Payer: Self-pay | Admitting: Family Medicine

## 2018-12-03 ENCOUNTER — Other Ambulatory Visit: Payer: Self-pay

## 2018-12-03 ENCOUNTER — Ambulatory Visit: Payer: Managed Care, Other (non HMO) | Admitting: Family Medicine

## 2018-12-03 VITALS — BP 148/100 | HR 75 | Temp 97.7°F | Resp 12 | Wt 184.0 lb

## 2018-12-03 DIAGNOSIS — E669 Obesity, unspecified: Secondary | ICD-10-CM

## 2018-12-03 DIAGNOSIS — E785 Hyperlipidemia, unspecified: Secondary | ICD-10-CM | POA: Diagnosis not present

## 2018-12-03 DIAGNOSIS — I10 Essential (primary) hypertension: Secondary | ICD-10-CM | POA: Diagnosis not present

## 2018-12-03 DIAGNOSIS — Z23 Encounter for immunization: Secondary | ICD-10-CM

## 2018-12-03 LAB — LIPID PANEL
Cholesterol: 154 mg/dL (ref 0–200)
HDL: 51.6 mg/dL (ref >39.00)
LDL Cholesterol: 84 mg/dL (ref 0–99)
NonHDL: 102.42
Total CHOL/HDL Ratio: 3
Triglycerides: 92 mg/dL (ref 0.0–149.0)
VLDL: 18.4 mg/dL (ref 0.0–40.0)

## 2018-12-03 LAB — COMPREHENSIVE METABOLIC PANEL
ALT: 13 U/L (ref 0–35)
AST: 16 U/L (ref 0–37)
Albumin: 3.3 g/dL — ABNORMAL LOW (ref 3.5–5.2)
Alkaline Phosphatase: 64 U/L (ref 39–117)
BUN: 15 mg/dL (ref 6–23)
CO2: 28 mEq/L (ref 19–32)
Calcium: 8.4 mg/dL (ref 8.4–10.5)
Chloride: 104 mEq/L (ref 96–112)
Creatinine, Ser: 1.12 mg/dL (ref 0.40–1.20)
GFR: 50.23 mL/min — ABNORMAL LOW (ref >60.00)
Glucose, Bld: 78 mg/dL (ref 70–99)
Potassium: 4.2 mEq/L (ref 3.5–5.1)
Sodium: 139 mEq/L (ref 135–145)
Total Bilirubin: 0.8 mg/dL (ref 0.2–1.2)
Total Protein: 6.3 g/dL (ref 6.0–8.3)

## 2018-12-03 MED ORDER — CARVEDILOL 12.5 MG PO TABS: 12.5000 mg | ORAL_TABLET | Freq: Two times a day (BID) | ORAL | 3 refills | Status: DC

## 2018-12-03 NOTE — Assessment & Plan Note (Signed)
Encouraged heart healthy diet, increase exercise, avoid trans fats, consider a krill oil cap daily Check labs today

## 2018-12-03 NOTE — Assessment & Plan Note (Signed)
con't with diet and exercise 

## 2018-12-03 NOTE — Patient Instructions (Signed)

## 2018-12-03 NOTE — Assessment & Plan Note (Signed)
Poorly controlled will alter medications, encouraged DASH diet, minimize caffeine and obtain adequate sleep. Report concerning symptoms and follow up as directed and as needed 

## 2018-12-03 NOTE — Progress Notes (Signed)
Patient ID: Sandy Green, female    DOB: January 22, 1961  Age: 58 y.o. MRN: [id number]    Subjective:  Subjective  HPI Daveah Kapela presents for bp f/u and labs.  No complaints.    Review of Systems  Constitutional: Negative for appetite change, diaphoresis, fatigue and unexpected weight change.  Eyes: Negative for pain, redness and visual disturbance.  Respiratory: Negative for cough, chest tightness, shortness of breath and wheezing.   Cardiovascular: Negative for chest pain, palpitations and leg swelling.  Endocrine: Negative for cold intolerance, heat intolerance, polydipsia, polyphagia and polyuria.  Genitourinary: Negative for difficulty urinating, dysuria and frequency.  Neurological: Negative for dizziness, light-headedness, numbness and headaches.    History Past Medical History:  Diagnosis Date  . Allergy   . Ankle fracture, left   . Anxiety   . Asthma   . Chicken pox   . Elbow fracture   . Heart murmur   . Hyperlipemia   . Hypertension     She has a past surgical history that includes Elbow fracture surgery (Left) and Wisdom tooth extraction.   Her family history includes Cancer in her maternal grandmother; Diabetes in her maternal grandmother; Heart disease in her father; Hypertension in her father and mother.She reports that she has never smoked. She has never used smokeless tobacco. She reports current alcohol use. She reports that she does not use drugs.  Current Outpatient Medications on File Prior to Visit  Medication Sig Dispense Refill  . albuterol (PROVENTIL HFA;VENTOLIN HFA) 108 (90 BASE) MCG/ACT inhaler Inhale 1-2 puffs into the lungs every 6 (six) hours as needed for wheezing or shortness of breath. 1 Inhaler 0  . atorvastatin (LIPITOR) 40 MG tablet Take 1 tablet (40 mg total) by mouth daily. 90 tablet 1  . beclomethasone (QVAR REDIHALER) 80 MCG/ACT inhaler TAKE 2 PUFFS BY MOUTH TWICE A DAY 31.8 g 1  . chlorpheniramine-HYDROcodone (TUSSIONEX PENNKINETIC ER) 10-8  MG/5ML SUER Take 5 mLs by mouth at bedtime as needed for cough. 100 mL 0  . escitalopram (LEXAPRO) 10 MG tablet Take 1 tablet (10 mg total) by mouth daily. 90 tablet 1  . Levocetirizine Dihydrochloride (XYZAL ALLERGY 24HR PO) Take 1 tablet by mouth daily.    . montelukast (SINGULAIR) 10 MG tablet TAKE 1 TABLET BY MOUTH EVERYDAY AT BEDTIME 90 tablet 1  . norethindrone-ethinyl estradiol-iron (JUNEL FE 1.5/30) 1.5-30 MG-MCG tablet Take 1 tablet by mouth daily. 84 tablet 1   Current Facility-Administered Medications on File Prior to Visit  Medication Dose Route Frequency Provider Last Rate Last Dose  . 0.9 %  sodium chloride infusion  500 mL Intravenous Continuous Armbruster, Carlota Raspberry, MD         Objective:  Objective  Physical Exam Constitutional:      Appearance: She is well-developed.  HENT:     Head: Normocephalic and atraumatic.  Eyes:     Conjunctiva/sclera: Conjunctivae normal.  Neck:     Musculoskeletal: Normal range of motion and neck supple.     Thyroid: No thyromegaly.     Vascular: No carotid bruit or JVD.  Cardiovascular:     Rate and Rhythm: Normal rate and regular rhythm.     Heart sounds: Normal heart sounds. No murmur.  Pulmonary:     Effort: Pulmonary effort is normal. No respiratory distress.     Breath sounds: Normal breath sounds. No wheezing or rales.  Chest:     Chest wall: No tenderness.  Neurological:     Mental Status: She is  alert and oriented to person, place, and time.    BP (!) 148/100 (BP Location: Right Arm, Cuff Size: Normal)   Pulse 75   Temp 97.7 F (36.5 C) (Temporal)   Resp 12   Wt 184 lb (83.5 kg)   SpO2 100%   BMI 29.70 kg/m  Wt Readings from Last 3 Encounters:  12/03/18 184 lb (83.5 kg)  05/28/18 187 lb (84.8 kg)  12/07/17 188 lb (85.3 kg)     Lab Results  Component Value Date   WBC 6.7 11/23/2017   HGB 13.2 11/23/2017   HCT 39.8 11/23/2017   PLT 280.0 11/23/2017   GLUCOSE 81 05/30/2018   CHOL 160 05/30/2018   TRIG 79.0  05/30/2018   HDL 48.10 05/30/2018   LDLCALC 96 05/30/2018   ALT 10 05/30/2018   AST 14 05/30/2018   NA 139 05/30/2018   K 4.0 05/30/2018   CL 106 05/30/2018   CREATININE 1.13 05/30/2018   BUN 11 05/30/2018   CO2 26 05/30/2018   TSH 0.89 11/23/2017    No results found.   Assessment & Plan:  Plan  I have discontinued Mileah Disanti's carvedilol. I am also having her start on carvedilol. Additionally, I am having her maintain her albuterol, Levocetirizine Dihydrochloride (XYZAL ALLERGY 24HR PO), chlorpheniramine-HYDROcodone, beclomethasone, atorvastatin, escitalopram, norethindrone-ethinyl estradiol-iron, and montelukast. We will continue to administer sodium chloride.  Meds ordered this encounter  Medications  . carvedilol (COREG) 12.5 MG tablet    Sig: Take 1 tablet (12.5 mg total) by mouth 2 (two) times daily with a meal.    Dispense:  60 tablet    Refill:  3    Problem List Items Addressed This Visit      Unprioritized   Essential hypertension    Poorly controlled will alter medications, encouraged DASH diet, minimize caffeine and obtain adequate sleep. Report concerning symptoms and follow up as directed and as needed      Relevant Medications   carvedilol (COREG) 12.5 MG tablet   Hyperlipidemia    Encouraged heart healthy diet, increase exercise, avoid trans fats, consider a krill oil cap daily Check labs today      Relevant Medications   carvedilol (COREG) 12.5 MG tablet   Obesity (BMI 30-39.9)    con't with diet and exercise       Other Visit Diagnoses    Influenza vaccine administered    -  Primary   Relevant Orders   Flu Vaccine QUAD 36+ mos IM (Fluarix & Fluzone Quad PF (Completed)      Follow-up: Return in about 2 weeks (around 12/17/2018) for hypertension.  Ann Held, DO

## 2018-12-05 ENCOUNTER — Encounter: Payer: Self-pay | Admitting: *Deleted

## 2018-12-17 ENCOUNTER — Ambulatory Visit: Payer: Managed Care, Other (non HMO) | Admitting: Family Medicine

## 2018-12-17 ENCOUNTER — Encounter: Payer: Self-pay | Admitting: Family Medicine

## 2018-12-17 ENCOUNTER — Other Ambulatory Visit: Payer: Self-pay

## 2018-12-17 VITALS — BP 148/90 | HR 75 | Temp 97.8°F | Resp 18 | Ht 66.0 in | Wt 185.4 lb

## 2018-12-17 DIAGNOSIS — I10 Essential (primary) hypertension: Secondary | ICD-10-CM | POA: Diagnosis not present

## 2018-12-17 MED ORDER — CARVEDILOL 25 MG PO TABS: 25.0000 mg | ORAL_TABLET | Freq: Two times a day (BID) | ORAL | 3 refills | Status: DC

## 2018-12-17 NOTE — Progress Notes (Signed)
Patient ID: Peri Longenecker, female    DOB: [birth year]  Age: 58 y.o. MRN: [id number]    Subjective:  Subjective  HPI Kinly Daube presents for bp f/u .   No complaints.   Review of Systems  Constitutional: Negative for appetite change, diaphoresis, fatigue and unexpected weight change.  Eyes: Negative for pain, redness and visual disturbance.  Respiratory: Negative for cough, chest tightness, shortness of breath and wheezing.   Cardiovascular: Negative for chest pain, palpitations and leg swelling.  Endocrine: Negative for cold intolerance, heat intolerance, polydipsia, polyphagia and polyuria.  Genitourinary: Negative for difficulty urinating, dysuria and frequency.  Neurological: Negative for dizziness, light-headedness, numbness and headaches.    History Past Medical History:  Diagnosis Date  . Allergy   . Ankle fracture, left   . Anxiety   . Asthma   . Chicken pox   . Elbow fracture   . Heart murmur   . Hyperlipemia   . Hypertension     She has a past surgical history that includes Elbow fracture surgery (Left) and Wisdom tooth extraction.   Her family history includes Cancer in her maternal grandmother; Diabetes in her maternal grandmother; Heart disease in her father; Hypertension in her father and mother.She reports that she has never smoked. She has never used smokeless tobacco. She reports current alcohol use. She reports that she does not use drugs.  Current Outpatient Medications on File Prior to Visit  Medication Sig Dispense Refill  . albuterol (PROVENTIL HFA;VENTOLIN HFA) 108 (90 BASE) MCG/ACT inhaler Inhale 1-2 puffs into the lungs every 6 (six) hours as needed for wheezing or shortness of breath. 1 Inhaler 0  . atorvastatin (LIPITOR) 40 MG tablet Take 1 tablet (40 mg total) by mouth daily. 90 tablet 1  . beclomethasone (QVAR REDIHALER) 80 MCG/ACT inhaler TAKE 2 PUFFS BY MOUTH TWICE A DAY 31.8 g 1  . escitalopram (LEXAPRO) 10 MG tablet Take 1 tablet (10 mg total) by  mouth daily. 90 tablet 1  . Levocetirizine Dihydrochloride (XYZAL ALLERGY 24HR PO) Take 1 tablet by mouth daily.    . montelukast (SINGULAIR) 10 MG tablet TAKE 1 TABLET BY MOUTH EVERYDAY AT BEDTIME 90 tablet 1  . norethindrone-ethinyl estradiol-iron (JUNEL FE 1.5/30) 1.5-30 MG-MCG tablet Take 1 tablet by mouth daily. 84 tablet 1   Current Facility-Administered Medications on File Prior to Visit  Medication Dose Route Frequency Provider Last Rate Last Dose  . 0.9 %  sodium chloride infusion  500 mL Intravenous Continuous Armbruster, Carlota Raspberry, MD         Objective:  Objective  Physical Exam Vitals signs and nursing note reviewed.  Constitutional:      Appearance: She is well-developed.  HENT:     Head: Normocephalic and atraumatic.  Eyes:     Conjunctiva/sclera: Conjunctivae normal.  Neck:     Musculoskeletal: Normal range of motion and neck supple.     Thyroid: No thyromegaly.     Vascular: No carotid bruit or JVD.  Cardiovascular:     Rate and Rhythm: Normal rate and regular rhythm.     Heart sounds: Normal heart sounds. No murmur.  Pulmonary:     Effort: Pulmonary effort is normal. No respiratory distress.     Breath sounds: Normal breath sounds. No wheezing or rales.  Chest:     Chest wall: No tenderness.  Neurological:     Mental Status: She is alert and oriented to person, place, and time.    BP (!) 148/90 (BP Location: Right  Arm, Patient Position: Sitting, Cuff Size: Normal)   Pulse 75   Temp 97.8 F (36.6 C) (Temporal)   Resp 18   Ht 5\' 6"  (1.676 m)   Wt 185 lb 6.4 oz (84.1 kg)   SpO2 96%   BMI 29.92 kg/m  Wt Readings from Last 3 Encounters:  12/17/18 185 lb 6.4 oz (84.1 kg)  12/03/18 184 lb (83.5 kg)  05/28/18 187 lb (84.8 kg)     Lab Results  Component Value Date   WBC 6.7 11/23/2017   HGB 13.2 11/23/2017   HCT 39.8 11/23/2017   PLT 280.0 11/23/2017   GLUCOSE 78 12/03/2018   CHOL 154 12/03/2018   TRIG 92.0 12/03/2018   HDL 51.60 12/03/2018    LDLCALC 84 12/03/2018   ALT 13 12/03/2018   AST 16 12/03/2018   NA 139 12/03/2018   K 4.2 12/03/2018   CL 104 12/03/2018   CREATININE 1.12 12/03/2018   BUN 15 12/03/2018   CO2 28 12/03/2018   TSH 0.89 11/23/2017    No results found.   Assessment & Plan:  Plan  I have discontinued Madaline Savage Settlemyre's chlorpheniramine-HYDROcodone and carvedilol. I am also having her start on carvedilol. Additionally, I am having her maintain her albuterol, Levocetirizine Dihydrochloride (XYZAL ALLERGY 24HR PO), beclomethasone, atorvastatin, escitalopram, norethindrone-ethinyl estradiol-iron, and montelukast. We will continue to administer sodium chloride.  Meds ordered this encounter  Medications  . carvedilol (COREG) 25 MG tablet    Sig: Take 1 tablet (25 mg total) by mouth 2 (two) times daily with a meal.    Dispense:  60 tablet    Refill:  3    Inc coreg to 25 mg bid  F/u 2 weeks   Follow-up: Return in about 3 weeks (around 01/07/2019), or if symptoms worsen or fail to improve, for hypertension.  Ann Held, DO

## 2018-12-17 NOTE — Assessment & Plan Note (Signed)
Poorly controlled will alter medications, encouraged DASH diet, minimize caffeine and obtain adequate sleep. Report concerning symptoms and follow up as directed and as needed 

## 2018-12-17 NOTE — Patient Instructions (Signed)

## 2019-01-07 ENCOUNTER — Ambulatory Visit: Payer: Managed Care, Other (non HMO) | Admitting: Family Medicine

## 2019-01-07 ENCOUNTER — Encounter: Payer: Self-pay | Admitting: Family Medicine

## 2019-01-07 ENCOUNTER — Other Ambulatory Visit: Payer: Self-pay

## 2019-01-07 VITALS — BP 148/86 | HR 75 | Temp 97.7°F | Resp 18 | Ht 66.0 in | Wt 188.4 lb

## 2019-01-07 DIAGNOSIS — I10 Essential (primary) hypertension: Secondary | ICD-10-CM

## 2019-01-07 MED ORDER — LOSARTAN POTASSIUM 25 MG PO TABS: 25.0000 mg | ORAL_TABLET | Freq: Every day | ORAL | 2 refills | Status: DC

## 2019-01-07 NOTE — Progress Notes (Signed)
Patient ID: Sandy Green, female    DOB: 1960-11-11  Age: 58 y.o. MRN: [id number]    Subjective:  Subjective  HPI Sandy Green presents for bp f/u.   No complaints  Review of Systems  Constitutional: Negative for appetite change, diaphoresis, fatigue and unexpected weight change.  Eyes: Negative for pain, redness and visual disturbance.  Respiratory: Negative for cough, chest tightness, shortness of breath and wheezing.   Cardiovascular: Negative for chest pain, palpitations and leg swelling.  Endocrine: Negative for cold intolerance, heat intolerance, polydipsia, polyphagia and polyuria.  Genitourinary: Negative for difficulty urinating, dysuria and frequency.  Neurological: Negative for dizziness, light-headedness, numbness and headaches.    History Past Medical History:  Diagnosis Date  . Allergy   . Ankle fracture, left   . Anxiety   . Asthma   . Chicken pox   . Elbow fracture   . Heart murmur   . Hyperlipemia   . Hypertension     She has a past surgical history that includes Elbow fracture surgery (Left) and Wisdom tooth extraction.   Her family history includes Cancer in her maternal grandmother; Diabetes in her maternal grandmother; Heart disease in her father; Hypertension in her father and mother.She reports that she has never smoked. She has never used smokeless tobacco. She reports current alcohol use. She reports that she does not use drugs.  Current Outpatient Medications on File Prior to Visit  Medication Sig Dispense Refill  . albuterol (PROVENTIL HFA;VENTOLIN HFA) 108 (90 BASE) MCG/ACT inhaler Inhale 1-2 puffs into the lungs every 6 (six) hours as needed for wheezing or shortness of breath. 1 Inhaler 0  . atorvastatin (LIPITOR) 40 MG tablet Take 1 tablet (40 mg total) by mouth daily. 90 tablet 1  . beclomethasone (QVAR REDIHALER) 80 MCG/ACT inhaler TAKE 2 PUFFS BY MOUTH TWICE A DAY 31.8 g 1  . carvedilol (COREG) 25 MG tablet Take 1 tablet (25 mg total) by mouth 2  (two) times daily with a meal. 60 tablet 3  . escitalopram (LEXAPRO) 10 MG tablet Take 1 tablet (10 mg total) by mouth daily. 90 tablet 1  . Levocetirizine Dihydrochloride (XYZAL ALLERGY 24HR PO) Take 1 tablet by mouth daily.    . montelukast (SINGULAIR) 10 MG tablet TAKE 1 TABLET BY MOUTH EVERYDAY AT BEDTIME 90 tablet 1  . norethindrone-ethinyl estradiol-iron (JUNEL FE 1.5/30) 1.5-30 MG-MCG tablet Take 1 tablet by mouth daily. 84 tablet 1   Current Facility-Administered Medications on File Prior to Visit  Medication Dose Route Frequency Provider Last Rate Last Dose  . 0.9 %  sodium chloride infusion  500 mL Intravenous Continuous Armbruster, Carlota Raspberry, MD         Objective:  Objective  Physical Exam Vitals signs and nursing note reviewed.  Constitutional:      Appearance: She is well-developed.  HENT:     Head: Normocephalic and atraumatic.  Eyes:     Conjunctiva/sclera: Conjunctivae normal.  Neck:     Musculoskeletal: Normal range of motion and neck supple.     Thyroid: No thyromegaly.     Vascular: No carotid bruit or JVD.  Cardiovascular:     Rate and Rhythm: Normal rate and regular rhythm.     Heart sounds: Normal heart sounds. No murmur.  Pulmonary:     Effort: Pulmonary effort is normal. No respiratory distress.     Breath sounds: Normal breath sounds. No wheezing or rales.  Chest:     Chest wall: No tenderness.  Neurological:  Mental Status: She is alert and oriented to person, place, and time.    BP (!) 148/86 (BP Location: Right Arm, Patient Position: Sitting, Cuff Size: Normal)   Pulse 75   Temp 97.7 F (36.5 C) (Temporal)   Resp 18   Ht 5\' 6"  (1.676 m)   Wt 188 lb 6.4 oz (85.5 kg)   SpO2 97%   BMI 30.41 kg/m  Wt Readings from Last 3 Encounters:  01/07/19 188 lb 6.4 oz (85.5 kg)  12/17/18 185 lb 6.4 oz (84.1 kg)  12/03/18 184 lb (83.5 kg)     Lab Results  Component Value Date   WBC 6.7 11/23/2017   HGB 13.2 11/23/2017   HCT 39.8 11/23/2017   PLT  280.0 11/23/2017   GLUCOSE 78 12/03/2018   CHOL 154 12/03/2018   TRIG 92.0 12/03/2018   HDL 51.60 12/03/2018   LDLCALC 84 12/03/2018   ALT 13 12/03/2018   AST 16 12/03/2018   NA 139 12/03/2018   K 4.2 12/03/2018   CL 104 12/03/2018   CREATININE 1.12 12/03/2018   BUN 15 12/03/2018   CO2 28 12/03/2018   TSH 0.89 11/23/2017    No results found.   Assessment & Plan:  Plan  I am having Sandy Green start on losartan. I am also having her maintain her albuterol, Levocetirizine Dihydrochloride (XYZAL ALLERGY 24HR PO), beclomethasone, atorvastatin, escitalopram, norethindrone-ethinyl estradiol-iron, montelukast, and carvedilol. We will continue to administer sodium chloride.  Meds ordered this encounter  Medications  . losartan (COZAAR) 25 MG tablet    Sig: Take 1 tablet (25 mg total) by mouth daily.    Dispense:  30 tablet    Refill:  2    Problem List Items Addressed This Visit      Unprioritized   Essential hypertension - Primary    Poorly controlled will alter medications, encouraged DASH diet, minimize caffeine and obtain adequate sleep. Report concerning symptoms and follow up as directed and as needed      Relevant Medications   losartan (COZAAR) 25 MG tablet      con't coreg , add cozaar     Follow-up: Return in about 3 months (around 04/09/2019), or if symptoms worsen or fail to improve.  Ann Held, DO

## 2019-01-07 NOTE — Assessment & Plan Note (Signed)
Poorly controlled will alter medications, encouraged DASH diet, minimize caffeine and obtain adequate sleep. Report concerning symptoms and follow up as directed and as needed 

## 2019-01-07 NOTE — Patient Instructions (Signed)

## 2019-01-25 ENCOUNTER — Other Ambulatory Visit: Payer: Self-pay | Admitting: Family Medicine

## 2019-01-31 ENCOUNTER — Other Ambulatory Visit: Payer: Self-pay | Admitting: Family Medicine

## 2019-01-31 DIAGNOSIS — F419 Anxiety disorder, unspecified: Secondary | ICD-10-CM

## 2019-02-03 ENCOUNTER — Other Ambulatory Visit: Payer: Self-pay | Admitting: Family Medicine

## 2019-02-04 ENCOUNTER — Other Ambulatory Visit: Payer: Self-pay | Admitting: Family Medicine

## 2019-02-04 DIAGNOSIS — F419 Anxiety disorder, unspecified: Secondary | ICD-10-CM

## 2019-03-21 ENCOUNTER — Other Ambulatory Visit: Payer: Self-pay | Admitting: Family Medicine

## 2019-03-21 DIAGNOSIS — I10 Essential (primary) hypertension: Secondary | ICD-10-CM

## 2019-03-21 NOTE — Telephone Encounter (Signed)
Last OV 01/07/19 Last refill 12/17/18 # 60/3 Next OV 04/12/19

## 2019-04-04 ENCOUNTER — Other Ambulatory Visit: Payer: Self-pay | Admitting: Family Medicine

## 2019-04-04 DIAGNOSIS — I10 Essential (primary) hypertension: Secondary | ICD-10-CM

## 2019-04-12 ENCOUNTER — Other Ambulatory Visit: Payer: Self-pay

## 2019-04-12 ENCOUNTER — Ambulatory Visit (INDEPENDENT_AMBULATORY_CARE_PROVIDER_SITE_OTHER): Payer: Managed Care, Other (non HMO) | Admitting: Family Medicine

## 2019-04-12 ENCOUNTER — Encounter: Payer: Self-pay | Admitting: Family Medicine

## 2019-04-12 DIAGNOSIS — E785 Hyperlipidemia, unspecified: Secondary | ICD-10-CM

## 2019-04-12 DIAGNOSIS — F419 Anxiety disorder, unspecified: Secondary | ICD-10-CM | POA: Diagnosis not present

## 2019-04-12 DIAGNOSIS — J4520 Mild intermittent asthma, uncomplicated: Secondary | ICD-10-CM | POA: Diagnosis not present

## 2019-04-12 DIAGNOSIS — I10 Essential (primary) hypertension: Secondary | ICD-10-CM | POA: Diagnosis not present

## 2019-04-12 DIAGNOSIS — Z3041 Encounter for surveillance of contraceptive pills: Secondary | ICD-10-CM

## 2019-04-12 MED ORDER — MONTELUKAST SODIUM 10 MG PO TABS: ORAL_TABLET | ORAL | 1 refills | Status: DC

## 2019-04-12 MED ORDER — CARVEDILOL 25 MG PO TABS: 25.0000 mg | ORAL_TABLET | Freq: Two times a day (BID) | ORAL | 1 refills | Status: DC

## 2019-04-12 MED ORDER — ATORVASTATIN CALCIUM 40 MG PO TABS: 40.0000 mg | ORAL_TABLET | Freq: Every day | ORAL | 1 refills | Status: DC

## 2019-04-12 MED ORDER — ESCITALOPRAM OXALATE 10 MG PO TABS: 10.0000 mg | ORAL_TABLET | Freq: Every day | ORAL | 1 refills | Status: DC

## 2019-04-12 MED ORDER — NORETHIN ACE-ETH ESTRAD-FE 1.5-30 MG-MCG PO TABS: 1.0000 | ORAL_TABLET | Freq: Every day | ORAL | 1 refills | Status: DC

## 2019-04-12 MED ORDER — IRBESARTAN 300 MG PO TABS: 300.0000 mg | ORAL_TABLET | Freq: Every day | ORAL | 1 refills | Status: DC

## 2019-04-12 NOTE — Progress Notes (Signed)
Virtual Visit via Video Note  I connected with Sandy Green on 04/12/19 at  8:00 AM EST by a video enabled telemedicine application and verified that I am speaking with the correct person using two identifiers.  Location: Patient: home alone  Provider: home    I discussed the limitations of evaluation and management by telemedicine and the availability of in person appointments. The patient expressed understanding and agreed to proceed.  History of Present Illness: Pt is home and has no complaints  She needs refills on her medications and will need to get labs    Observations/Objective: 153/110  97%   Pt is in NAD  Assessment and Plan: 1. Essential hypertension Poorly controlled will alter medications, encouraged DASH diet, minimize caffeine and obtain adequate sleep. Report concerning symptoms and follow up as directed and as needed - irbesartan (AVAPRO) 300 MG tablet; Take 1 tablet (300 mg total) by mouth daily.  Dispense: 90 tablet; Refill: 1 - carvedilol (COREG) 25 MG tablet; Take 1 tablet (25 mg total) by mouth 2 (two) times daily with a meal.  Dispense: 180 tablet; Refill: 1  2. Anxiety Stable con't meds  - escitalopram (LEXAPRO) 10 MG tablet; Take 1 tablet (10 mg total) by mouth daily.  Dispense: 90 tablet; Refill: 1  3. Hyperlipidemia, unspecified hyperlipidemia type Tolerating statin, encouraged heart healthy diet, avoid trans fats, minimize simple carbs and saturated fats. Increase exercise as tolerated - atorvastatin (LIPITOR) 40 MG tablet; Take 1 tablet (40 mg total) by mouth daily.  Dispense: 90 tablet; Refill: 1 - Lipid panel; Future - Comprehensive metabolic panel; Future  4. Mild intermittent asthma without complication Stable She has not needed he inhalers in years - montelukast (SINGULAIR) 10 MG tablet; 1 po qd  Dispense: 90 tablet; Refill: 1  5. Encounter for surveillance of contraceptive pills Refill bcp - norethindrone-ethinyl estradiol-iron (JUNEL FE  1.5/30) 1.5-30 MG-MCG tablet; Take 1 tablet by mouth daily.  Dispense: 84 tablet; Refill: 1   Follow Up Instructions:    I discussed the assessment and treatment plan with the patient. The patient was provided an opportunity to ask questions and all were answered. The patient agreed with the plan and demonstrated an understanding of the instructions.   The patient was advised to call back or seek an in-person evaluation if the symptoms worsen or if the condition fails to improve as anticipated.     Ann Held, DO

## 2019-06-10 ENCOUNTER — Encounter: Payer: Self-pay | Admitting: Family Medicine

## 2019-06-10 NOTE — Telephone Encounter (Signed)
Back back down to 150 mg avapro and add clonidine 0.1 bid #60  Ov in 2 weeks

## 2019-06-11 ENCOUNTER — Other Ambulatory Visit: Payer: Self-pay

## 2019-06-11 DIAGNOSIS — I10 Essential (primary) hypertension: Secondary | ICD-10-CM

## 2019-06-11 MED ORDER — CLONIDINE HCL 0.1 MG PO TABS: 0.1000 mg | ORAL_TABLET | Freq: Two times a day (BID) | ORAL | 0 refills | Status: DC

## 2019-06-14 ENCOUNTER — Other Ambulatory Visit: Payer: Self-pay

## 2019-06-14 ENCOUNTER — Telehealth: Payer: Self-pay | Admitting: Family Medicine

## 2019-06-14 DIAGNOSIS — I10 Essential (primary) hypertension: Secondary | ICD-10-CM

## 2019-06-14 MED ORDER — CLONIDINE 0.1 MG/24HR TD PTWK: 0.1000 mg | MEDICATED_PATCH | TRANSDERMAL | 0 refills | Status: DC

## 2019-06-14 MED ORDER — HYDROCHLOROTHIAZIDE 25 MG PO TABS: 25.0000 mg | ORAL_TABLET | Freq: Every day | ORAL | 1 refills | Status: DC

## 2019-06-14 NOTE — Telephone Encounter (Signed)
Running out of options  Hctz 25 mg 1 po qd  #90  1 refills

## 2019-06-14 NOTE — Telephone Encounter (Signed)
  [facility] ) called stating that patient was prescribed : cloNIDine (CATAPRES - DOSED IN MG/24 HR) 0.1 mg/24hr patch [id number]   And that theses patches are typically used very 7day not every 3days   Please advise    Patient also would like to if there is any other medication Dr ca change her to b/c of dry mouth

## 2019-06-14 NOTE — Telephone Encounter (Signed)
See below. Please advise.  

## 2019-06-14 NOTE — Telephone Encounter (Signed)
New med sent in

## 2019-06-14 NOTE — Telephone Encounter (Signed)
Called and they state patient would like something new due to patient stating patches are causing dry mouth. Please advise

## 2019-06-14 NOTE — Telephone Encounter (Signed)
Yes--- every 7 days #4  2 refills

## 2019-06-14 NOTE — Telephone Encounter (Signed)
Change to clonidine patch 0.1  q72 h  #10

## 2019-06-17 ENCOUNTER — Encounter: Payer: Self-pay | Admitting: Family Medicine

## 2019-06-21 ENCOUNTER — Other Ambulatory Visit: Payer: Self-pay

## 2019-06-24 ENCOUNTER — Encounter: Payer: Self-pay | Admitting: Family Medicine

## 2019-06-24 ENCOUNTER — Ambulatory Visit (INDEPENDENT_AMBULATORY_CARE_PROVIDER_SITE_OTHER): Payer: Self-pay | Admitting: Family Medicine

## 2019-06-24 ENCOUNTER — Other Ambulatory Visit: Payer: Self-pay

## 2019-06-24 VITALS — BP 113/81 | HR 80 | Temp 97.5°F | Resp 16 | Ht 66.0 in | Wt 189.5 lb

## 2019-06-24 DIAGNOSIS — I10 Essential (primary) hypertension: Secondary | ICD-10-CM

## 2019-06-24 DIAGNOSIS — R252 Cramp and spasm: Secondary | ICD-10-CM

## 2019-06-24 NOTE — Assessment & Plan Note (Signed)
Poorly controlled will alter medications, encouraged DASH diet, minimize caffeine and obtain adequate sleep. Report concerning symptoms and follow up as directed and as needed 

## 2019-06-24 NOTE — Patient Instructions (Signed)
Leg Cramps Leg cramps occur when one or more muscles tighten and you have no control over this tightening (involuntary muscle contraction). Muscle cramps can develop in any muscle, but the most common place is in the calf muscles of the leg. Those cramps can occur during exercise or when you are at rest. Leg cramps are painful, and they may last for a few seconds to a few minutes. Cramps may return several times before they finally stop. Usually, leg cramps are not caused by a serious medical problem. In many cases, the cause is not known. Some common causes include:  Excessive physical effort (overexertion), such as during intense exercise.  Overuse from repetitive motions, or doing the same thing over and over.  Staying in a certain position for a long period of time.  Improper preparation, form, or technique while performing a sport or an activity.  Dehydration.  Injury.  Side effects of certain medicines.  Abnormally low levels of minerals in your blood (electrolytes), especially potassium and calcium. This could result from: ? Pregnancy. ? Taking diuretic medicines. Follow these instructions at home: Eating and drinking  Drink enough fluid to keep your urine pale yellow. Staying hydrated may help prevent cramps.  Eat a healthy diet that includes plenty of nutrients to help your muscles function. A healthy diet includes fruits and vegetables, lean protein, whole grains, and low-fat or nonfat dairy products. Managing pain, stiffness, and swelling      Try massaging, stretching, and relaxing the affected muscle. Do this for several minutes at a time.  If directed, put ice on areas that are sore or painful after a cramp: ? Put ice in a plastic bag. ? Place a towel between your skin and the bag. ? Leave the ice on for 20 minutes, 2-3 times a day.  If directed, apply heat to muscles that are tense or tight. Do this before you exercise, or as often as told by your health care  provider. Use the heat source that your health care provider recommends, such as a moist heat pack or a heating pad. ? Place a towel between your skin and the heat source. ? Leave the heat on for 20-30 minutes. ? Remove the heat if your skin turns bright red. This is especially important if you are unable to feel pain, heat, or cold. You may have a greater risk of getting burned.  Try taking hot showers or baths to help relax tight muscles. General instructions  If you are having frequent leg cramps, avoid intense exercise for several days.  Take over-the-counter and prescription medicines only as told by your health care provider.  Keep all follow-up visits as told by your health care provider. This is important. Contact a health care provider if:  Your leg cramps get more severe or more frequent, or they do not improve over time.  Your foot becomes cold, numb, or blue. Summary  Muscle cramps can develop in any muscle, but the most common place is in the calf muscles of the leg.  Leg cramps are painful, and they may last for a few seconds to a few minutes.  Usually, leg cramps are not caused by a serious medical problem. Often, the cause is not known.  Stay hydrated and take over-the-counter and prescription medicines only as told by your health care provider. This information is not intended to replace advice given to you by your health care provider. Make sure you discuss any questions you have with your health care  provider. Document Revised: 03/03/2017 Document Reviewed: 12/29/2016 Elsevier Patient Education  2020 [employer]  

## 2019-06-24 NOTE — Progress Notes (Signed)
Patient ID: Sandy Green, female    DOB: [birth year]  Age: 59 y.o. MRN: [id number]    Subjective:  Subjective  HPI Sharni League presents for f/u leg cramps.  The cramps did not get better with the change in meds.   No other symptoms   Review of Systems  Constitutional: Negative for appetite change, diaphoresis, fatigue and unexpected weight change.  Eyes: Negative for pain, redness and visual disturbance.  Respiratory: Negative for cough, chest tightness, shortness of breath and wheezing.   Cardiovascular: Negative for chest pain, palpitations and leg swelling.  Endocrine: Negative for cold intolerance, heat intolerance, polydipsia, polyphagia and polyuria.  Genitourinary: Negative for difficulty urinating, dysuria and frequency.  Neurological: Negative for dizziness, light-headedness, numbness and headaches.    History Past Medical History:  Diagnosis Date  . Allergy   . Ankle fracture, left   . Anxiety   . Asthma   . Chicken pox   . Elbow fracture   . Heart murmur   . Hyperlipemia   . Hypertension     She has a past surgical history that includes Elbow fracture surgery (Left) and Wisdom tooth extraction.   Her family history includes Cancer in her maternal grandmother; Diabetes in her maternal grandmother; Heart disease in her father; Hypertension in her father and mother.She reports that she has never smoked. She has never used smokeless tobacco. She reports current alcohol use. She reports that she does not use drugs.  Current Outpatient Medications on File Prior to Visit  Medication Sig Dispense Refill  . albuterol (PROVENTIL HFA;VENTOLIN HFA) 108 (90 BASE) MCG/ACT inhaler Inhale 1-2 puffs into the lungs every 6 (six) hours as needed for wheezing or shortness of breath. 1 Inhaler 0  . atorvastatin (LIPITOR) 40 MG tablet Take 1 tablet (40 mg total) by mouth daily. 90 tablet 1  . carvedilol (COREG) 25 MG tablet Take 1 tablet (25 mg total) by mouth 2 (two) times daily with a  meal. 180 tablet 1  . escitalopram (LEXAPRO) 10 MG tablet Take 1 tablet (10 mg total) by mouth daily. 90 tablet 1  . hydrochlorothiazide (HYDRODIURIL) 25 MG tablet Take 1 tablet (25 mg total) by mouth daily. 90 tablet 1  . irbesartan (AVAPRO) 300 MG tablet Take 1 tablet (300 mg total) by mouth daily. 90 tablet 1  . Levocetirizine Dihydrochloride (XYZAL ALLERGY 24HR PO) Take 1 tablet by mouth daily.    . montelukast (SINGULAIR) 10 MG tablet 1 po qd 90 tablet 1  . norethindrone-ethinyl estradiol-iron (JUNEL FE 1.5/30) 1.5-30 MG-MCG tablet Take 1 tablet by mouth daily. 84 tablet 1  . QVAR REDIHALER 80 MCG/ACT inhaler TAKE 2 PUFFS BY MOUTH TWICE A DAY 31.8 g 1   Current Facility-Administered Medications on File Prior to Visit  Medication Dose Route Frequency Provider Last Rate Last Admin  . 0.9 %  sodium chloride infusion  500 mL Intravenous Continuous Armbruster, Carlota Raspberry, MD         Objective:  Objective  Physical Exam Vitals and nursing note reviewed.  Constitutional:      Appearance: She is well-developed.  HENT:     Head: Normocephalic and atraumatic.  Eyes:     Conjunctiva/sclera: Conjunctivae normal.  Neck:     Thyroid: No thyromegaly.     Vascular: No carotid bruit or JVD.  Cardiovascular:     Rate and Rhythm: Normal rate and regular rhythm.     Heart sounds: Normal heart sounds. No murmur.  Pulmonary:     Effort:  Pulmonary effort is normal. No respiratory distress.     Breath sounds: Normal breath sounds. No wheezing or rales.  Chest:     Chest wall: No tenderness.  Musculoskeletal:     Cervical back: Normal range of motion and neck supple.  Neurological:     Mental Status: She is alert and oriented to person, place, and time.    BP 113/81 (BP Location: Left Arm, Patient Position: Sitting, Cuff Size: Normal)   Pulse 80   Temp (!) 97.5 F (36.4 C) (Temporal)   Resp 16   Ht 5\' 6"  (1.676 m)   Wt 189 lb 8 oz (86 kg)   SpO2 100%   BMI 30.59 kg/m  Wt Readings from  Last 3 Encounters:  06/24/19 189 lb 8 oz (86 kg)  01/07/19 188 lb 6.4 oz (85.5 kg)  12/17/18 185 lb 6.4 oz (84.1 kg)     Lab Results  Component Value Date   WBC 6.7 11/23/2017   HGB 13.2 11/23/2017   HCT 39.8 11/23/2017   PLT 280.0 11/23/2017   GLUCOSE 78 12/03/2018   CHOL 154 12/03/2018   TRIG 92.0 12/03/2018   HDL 51.60 12/03/2018   LDLCALC 84 12/03/2018   ALT 13 12/03/2018   AST 16 12/03/2018   NA 139 12/03/2018   K 4.2 12/03/2018   CL 104 12/03/2018   CREATININE 1.12 12/03/2018   BUN 15 12/03/2018   CO2 28 12/03/2018   TSH 0.89 11/23/2017    No results found.   Assessment & Plan:  Plan  I have discontinued Madaline Savage Derocher's cloNIDine. I am also having her maintain her albuterol, Levocetirizine Dihydrochloride (XYZAL ALLERGY 24HR PO), Qvar RediHaler, irbesartan, montelukast, norethindrone-ethinyl estradiol-iron, carvedilol, escitalopram, atorvastatin, and hydrochlorothiazide. We will continue to administer sodium chloride.  No orders of the defined types were placed in this encounter.   Problem List Items Addressed This Visit      Unprioritized   Essential hypertension - Primary    Poorly controlled will alter medications, encouraged DASH diet, minimize caffeine and obtain adequate sleep. Report concerning symptoms and follow up as directed and as needed      Relevant Orders   CBC with Differential/Platelet   Comprehensive metabolic panel   Lipid panel    Other Visit Diagnoses    Leg cramps       Relevant Orders   Comprehensive metabolic panel   Magnesium   Phosphorus      Follow-up: Return in about 3 months (around 09/24/2019), or if symptoms worsen or fail to improve, for hypertension, hyperlipidemia.  Ann Held, DO

## 2019-06-25 LAB — CBC WITH DIFFERENTIAL/PLATELET
Basophils Absolute: 0.1 10*3/uL (ref 0.0–0.1)
Basophils Relative: 1.3 % (ref 0.0–3.0)
Eosinophils Absolute: 0.2 10*3/uL (ref 0.0–0.7)
Eosinophils Relative: 2.1 % (ref 0.0–5.0)
HCT: 40.9 % (ref 36.0–46.0)
Hemoglobin: 13.5 g/dL (ref 12.0–15.0)
Lymphocytes Relative: 23.8 % (ref 12.0–46.0)
Lymphs Abs: 2.3 10*3/uL (ref 0.7–4.0)
MCHC: 33 g/dL (ref 30.0–36.0)
MCV: 92.5 fl (ref 78.0–100.0)
Monocytes Absolute: 0.9 10*3/uL (ref 0.1–1.0)
Monocytes Relative: 9.7 % (ref 3.0–12.0)
Neutro Abs: 6.1 10*3/uL (ref 1.4–7.7)
Neutrophils Relative %: 63.1 % (ref 43.0–77.0)
Platelets: 297 10*3/uL (ref 150.0–400.0)
RBC: 4.42 Mil/uL (ref 3.87–5.11)
RDW: 13.3 % (ref 11.5–15.5)
WBC: 9.7 10*3/uL (ref 4.0–10.5)

## 2019-06-25 LAB — LIPID PANEL
Cholesterol: 160 mg/dL (ref 0–200)
HDL: 43.1 mg/dL (ref >39.00)
LDL Cholesterol: 95 mg/dL (ref 0–99)
NonHDL: 117.13
Total CHOL/HDL Ratio: 4
Triglycerides: 110 mg/dL (ref 0.0–149.0)
VLDL: 22 mg/dL (ref 0.0–40.0)

## 2019-06-25 LAB — COMPREHENSIVE METABOLIC PANEL
ALT: 11 U/L (ref 0–35)
AST: 14 U/L (ref 0–37)
Albumin: 3.6 g/dL (ref 3.5–5.2)
Alkaline Phosphatase: 62 U/L (ref 39–117)
BUN: 25 mg/dL — ABNORMAL HIGH (ref 6–23)
CO2: 29 mEq/L (ref 19–32)
Calcium: 8.8 mg/dL (ref 8.4–10.5)
Chloride: 104 mEq/L (ref 96–112)
Creatinine, Ser: 1.49 mg/dL — ABNORMAL HIGH (ref 0.40–1.20)
GFR: 36.06 mL/min — ABNORMAL LOW (ref >60.00)
Glucose, Bld: 69 mg/dL — ABNORMAL LOW (ref 70–99)
Potassium: 4.3 mEq/L (ref 3.5–5.1)
Sodium: 138 mEq/L (ref 135–145)
Total Bilirubin: 1.1 mg/dL (ref 0.2–1.2)
Total Protein: 6.6 g/dL (ref 6.0–8.3)

## 2019-06-25 LAB — PHOSPHORUS: Phosphorus: 3.3 mg/dL (ref 2.3–4.6)

## 2019-06-25 LAB — MAGNESIUM: Magnesium: 2.2 mg/dL (ref 1.5–2.5)

## 2019-06-26 ENCOUNTER — Encounter: Payer: Self-pay | Admitting: Family Medicine

## 2019-06-26 ENCOUNTER — Other Ambulatory Visit: Payer: Self-pay | Admitting: Family Medicine

## 2019-06-26 DIAGNOSIS — R7989 Other specified abnormal findings of blood chemistry: Secondary | ICD-10-CM

## 2019-06-26 NOTE — Telephone Encounter (Signed)
Noted  

## 2019-07-03 ENCOUNTER — Other Ambulatory Visit: Payer: Self-pay | Admitting: Family Medicine

## 2019-07-03 DIAGNOSIS — I10 Essential (primary) hypertension: Secondary | ICD-10-CM

## 2019-07-14 ENCOUNTER — Encounter: Payer: Self-pay | Admitting: Family Medicine

## 2019-07-14 DIAGNOSIS — J4520 Mild intermittent asthma, uncomplicated: Secondary | ICD-10-CM

## 2019-07-14 DIAGNOSIS — E785 Hyperlipidemia, unspecified: Secondary | ICD-10-CM

## 2019-07-14 DIAGNOSIS — I10 Essential (primary) hypertension: Secondary | ICD-10-CM

## 2019-07-14 DIAGNOSIS — F419 Anxiety disorder, unspecified: Secondary | ICD-10-CM

## 2019-07-15 MED ORDER — MONTELUKAST SODIUM 10 MG PO TABS: ORAL_TABLET | ORAL | 1 refills | Status: DC

## 2019-07-15 MED ORDER — IRBESARTAN 300 MG PO TABS: 300.0000 mg | ORAL_TABLET | Freq: Every day | ORAL | 1 refills | Status: DC

## 2019-07-15 MED ORDER — ATORVASTATIN CALCIUM 40 MG PO TABS: 40.0000 mg | ORAL_TABLET | Freq: Every day | ORAL | 1 refills | Status: DC

## 2019-07-15 MED ORDER — ESCITALOPRAM OXALATE 10 MG PO TABS: 10.0000 mg | ORAL_TABLET | Freq: Every day | ORAL | 1 refills | Status: DC

## 2019-07-15 MED ORDER — QVAR REDIHALER 80 MCG/ACT IN AERB: INHALATION_SPRAY | RESPIRATORY_TRACT | 1 refills | Status: DC

## 2019-07-17 ENCOUNTER — Encounter: Payer: Self-pay | Admitting: Family Medicine

## 2019-07-20 ENCOUNTER — Other Ambulatory Visit: Payer: Self-pay | Admitting: Family Medicine

## 2019-07-20 DIAGNOSIS — I10 Essential (primary) hypertension: Secondary | ICD-10-CM

## 2019-07-24 NOTE — Telephone Encounter (Signed)
Spoke with patient. Pt states she was able to cut the Avapro in half and doesn't believe the dizziness is coming from the Avapro. Pt states having dizziness and fatigue from the [place]. I asked patient if she wanted [place] to send in the 150 MG Avapro and she declined. Please advise about the [place]

## 2019-07-24 NOTE — Telephone Encounter (Signed)
Dizziness may be from 300 mg avapro  Call in avapro 150 mg   D/c 300 mg  1 po qd  #30   F/u 2-3 weeks in office

## 2019-07-25 NOTE — Telephone Encounter (Signed)
Stop hctz and f/u 2-3 weeks

## 2019-08-22 ENCOUNTER — Other Ambulatory Visit: Payer: Self-pay

## 2019-08-22 ENCOUNTER — Other Ambulatory Visit: Payer: Self-pay | Admitting: Family Medicine

## 2019-08-22 DIAGNOSIS — I10 Essential (primary) hypertension: Secondary | ICD-10-CM

## 2019-08-22 DIAGNOSIS — F419 Anxiety disorder, unspecified: Secondary | ICD-10-CM

## 2019-08-22 DIAGNOSIS — Z3041 Encounter for surveillance of contraceptive pills: Secondary | ICD-10-CM

## 2019-08-22 NOTE — Telephone Encounter (Signed)
Caller: [facility] Call back number [phone] Medication: escitalopram (LEXAPRO) 10 MG tablet  norethindrone-ethinyl estradiol-iron (JUNEL FE 1.5/30) 1.5-30 MG-MCG tablet  irbesartan (AVAPRO) (patient is requesting for the mg to be change to 150mg  instead of 300mg      Has the patient contacted their pharmacy?  (If no, request that the patient contact the pharmacy for the refill.) (If yes, when and what did the pharmacy advise?)     Preferred Pharmacy (with phone number or street name): [facility] [place]  [address] [place] [address]  Phone:  [phone] Fax:  [phone]     Agent: Please be advised that RX refills may take up to 3 business days. We ask that you follow-up with your pharmacy.

## 2019-08-23 ENCOUNTER — Telehealth: Payer: Self-pay | Admitting: Family Medicine

## 2019-08-23 MED ORDER — ESCITALOPRAM OXALATE 10 MG PO TABS: 10.0000 mg | ORAL_TABLET | Freq: Every day | ORAL | 1 refills | Status: DC

## 2019-08-23 MED ORDER — NORETHIN ACE-ETH ESTRAD-FE 1.5-30 MG-MCG PO TABS: 1.0000 | ORAL_TABLET | Freq: Every day | ORAL | 1 refills | Status: DC

## 2019-08-23 MED ORDER — IRBESARTAN 300 MG PO TABS: 300.0000 mg | ORAL_TABLET | Freq: Every day | ORAL | 1 refills | Status: DC

## 2019-08-23 NOTE — Telephone Encounter (Signed)
Pt on irbesartan 300mg  once daily. Rx's sent.

## 2019-08-23 NOTE — Telephone Encounter (Signed)
Medication: irbesartan (AVAPRO) 300 MG table ( Pt request for 150 Mg instead of 300 because they dont want to cut in half)   escitalopram (LEXAPRO)  JUNEL FE 1.5/30 1.5-30 MG-MCG tablet   Has the patient contacted their pharmacy? Yes.   (If no, request that the patient contact the pharmacy for the refill.) (If yes, when and what did the pharmacy advise?)  Preferred Pharmacy (with phone number or street name):  [facility] [place]  [address], [place] [address]  Phone:  [phone] Fax:  [phone]  Agent: Please be advised that RX refills may take up to 3 business days. We ask that you follow-up with your pharmacy.

## 2019-08-23 NOTE — Telephone Encounter (Signed)
Rxs sent

## 2019-09-04 ENCOUNTER — Encounter (HOSPITAL_COMMUNITY): Payer: Self-pay

## 2019-09-04 ENCOUNTER — Other Ambulatory Visit: Payer: Self-pay

## 2019-09-04 ENCOUNTER — Ambulatory Visit (HOSPITAL_COMMUNITY)
Admission: EM | Admit: 2019-09-04 | Discharge: 2019-09-04 | Disposition: A | Discharge status: Home / Self Care | Payer: Managed Care, Other (non HMO)

## 2019-09-04 DIAGNOSIS — R1030 Lower abdominal pain, unspecified: Secondary | ICD-10-CM | POA: Diagnosis not present

## 2019-09-04 LAB — POCT URINALYSIS DIP (DEVICE)
Glucose, UA: NEGATIVE mg/dL
Ketones, ur: NEGATIVE mg/dL
Leukocytes,Ua: NEGATIVE
Nitrite: NEGATIVE
Protein, ur: 30 mg/dL — AB
Specific Gravity, Urine: >=1.03 (ref 1.005–1.030)
Urobilinogen, UA: 0.2 mg/dL (ref 0.0–1.0)
pH: 5.5 (ref 5.0–8.0)

## 2019-09-04 NOTE — ED Triage Notes (Signed)
Pt c/o 4/10 pain in pelvic areax2 days. Pain is worse when she lays flat on back. Pt denies vaginal discharge or urinary issues.

## 2019-09-04 NOTE — Discharge Instructions (Addendum)
Believe your pain is most likely related to the abdominal muscles and muscle strain. Your urine did not show any infection but you do appear to be mildly dehydrated.  Make sure you are drinking plenty of water You can try doing a low-dose of ibuprofen to see if this helps with your pain.  Otherwise you can take Tylenol If your pain worsens to include severe abdominal pain, nausea or vomiting you will need to go to the ER Follow up as needed for continued or worsening symptoms

## 2019-09-04 NOTE — ED Provider Notes (Signed)
[facility]    CSN: [id number] Arrival date & time: 09/04/19  1013      History   Chief Complaint Chief Complaint  Patient presents with  . pelvic pain    HPI Sandy Green is a 59 y.o. female.   Patient is a 59 year old female past medical history of allergy, anxiety, asthma, hyperlipidemia, hypertension.  She presents today with approximately 2 days of suprapubic discomfort and lower abdominal discomfort.  Pain is worse with lying flat on her back and moving in certain positions from lying to sitting.  Reporting more pain when she feels like she needs to go urinate and some mild retention.  No dysuria, hematuria or frequency.  Denies any vaginal discharge, vaginal bleeding.  No flank pain, nausea or vomiting.  No fever.  ROS per HPI      Past Medical History:  Diagnosis Date  . Allergy   . Ankle fracture, left   . Anxiety   . Asthma   . Chicken pox   . Elbow fracture   . Heart murmur   . Hyperlipemia   . Hypertension     Patient Active Problem List   Diagnosis Date Noted  . Anxiety 05/28/2018  . Left wrist pain 09/21/2015  . Pain of left heel 09/21/2015  . Vaginitis and vulvovaginitis 03/23/2015  . Rash and nonspecific skin eruption 01/01/2015  . Essential hypertension 06/16/2014  . Conjunctivitis 04/09/2014  . Hyperlipidemia 02/28/2013  . Multiple allergies 02/28/2013  . Asthma, mild intermittent 02/28/2013  . Obesity (BMI 30-39.9) 02/27/2013    Past Surgical History:  Procedure Laterality Date  . ELBOW FRACTURE SURGERY Left   . WISDOM TOOTH EXTRACTION      OB History   No obstetric history on file.      Home Medications    Prior to Admission medications   Medication Sig Start Date End Date Taking? Authorizing Provider  fluticasone (FLONASE) 50 MCG/ACT nasal spray Place into both nostrils daily.   Yes [provider]  atorvastatin (LIPITOR) 40 MG tablet Take 1 tablet (40 mg total) by mouth daily. 07/15/19   Ann Held, DO  azelastine (ASTELIN) 0.1 % nasal spray  08/22/19   [provider]  beclomethasone (QVAR REDIHALER) 80 MCG/ACT inhaler TAKE 2 PUFFS BY MOUTH TWICE A DAY 07/15/19   Carollee Herter, Alferd Apa, DO  carvedilol (COREG) 25 MG tablet Take 1 tablet (25 mg total) by mouth 2 (two) times daily with a meal. 04/12/19   Carollee Herter, Alferd Apa, DO  escitalopram (LEXAPRO) 10 MG tablet Take 1 tablet (10 mg total) by mouth daily. 08/23/19   Ann Held, DO  Levocetirizine Dihydrochloride (XYZAL ALLERGY 24HR PO) Take 1 tablet by mouth daily.    [provider]  montelukast (SINGULAIR) 10 MG tablet 1 po qd 07/15/19   Carollee Herter, Alferd Apa, DO  norethindrone-ethinyl estradiol-iron (JUNEL FE 1.5/30) 1.5-30 MG-MCG tablet Take 1 tablet by mouth daily. 08/23/19   [name] R, DO  albuterol (PROVENTIL HFA;VENTOLIN HFA) 108 (90 BASE) MCG/ACT inhaler Inhale 1-2 puffs into the lungs every 6 (six) hours as needed for wheezing or shortness of breath. 05/03/14 09/04/19  Harden Mo, MD  cloNIDine (CATAPRES) 0.1 MG tablet TAKE 1 TABLET (0.1 MG TOTAL) BY MOUTH 2 (TWO) TIMES DAILY. 07/22/19 09/04/19  Ann Held, DO  hydrochlorothiazide (HYDRODIURIL) 25 MG tablet Take 1 tablet (25 mg total) by mouth daily. 06/14/19 09/04/19  Ann Held, DO  irbesartan (AVAPRO) 300 MG tablet Take 1 tablet (300 mg total) by mouth daily. Patient taking differently: Take 150 mg by mouth daily.  08/23/19 09/04/19  Ann Held, DO    Family History Family History  Problem Relation Age of Onset  . Hypertension Mother   . Hypertension Father   . Heart disease Father        cabg.[id number]  . Diabetes Maternal Grandmother   . Cancer Maternal Grandmother        Unsure of kind? Breast  . Colon cancer Neg Hx   . Esophageal cancer Neg Hx   . Rectal cancer Neg Hx   . Stomach cancer Neg Hx     Social History Social History   Tobacco Use  . Smoking status: Never Smoker  . Smokeless tobacco: Never Used   Substance Use Topics  . Alcohol use: Yes    Alcohol/week: 0.0 standard drinks    Comment: Rare  . Drug use: No     Allergies   Amlodipine, Naproxen, Penicillins, and Tetracyclines & related   Review of Systems Review of Systems   Physical Exam Triage Vital Signs ED Triage Vitals  Enc Vitals Group     BP 09/04/19 1031 (!) 131/94     Pulse Rate 09/04/19 1031 84     Resp 09/04/19 1031 16     Temp 09/04/19 1031 97.6 F (36.4 C)     Temp Source 09/04/19 1031 Oral     SpO2 09/04/19 1031 99 %     Weight 09/04/19 1032 192 lb (87.1 kg)     Height 09/04/19 1032 5\' 6"  (1.676 m)     Head Circumference --      Peak Flow --      Pain Score 09/04/19 1031 4     Pain Loc --      Pain Edu? --      Excl. in [place]? --    No data found.  Updated Vital Signs BP (!) 131/94   Pulse 84   Temp 97.6 F (36.4 C) (Oral)   Resp 16   Ht 5\' 6"  (1.676 m)   Wt 192 lb (87.1 kg)   SpO2 99%   BMI 30.99 kg/m   Visual Acuity Right Eye Distance:   Left Eye Distance:   Bilateral Distance:    Right Eye Near:   Left Eye Near:    Bilateral Near:     Physical Exam Vitals and nursing note reviewed.  Constitutional:      General: She is not in acute distress.    Appearance: Normal appearance. She is not ill-appearing, toxic-appearing or diaphoretic.  HENT:     Head: Normocephalic.     Nose: Nose normal.     Mouth/Throat:     Pharynx: Oropharynx is clear.  Eyes:     Conjunctiva/sclera: Conjunctivae normal.  Pulmonary:     Effort: Pulmonary effort is normal.  Abdominal:     General: Abdomen is flat.     Palpations: Abdomen is soft.     Tenderness: There is no abdominal tenderness. There is no right CVA tenderness or left CVA tenderness.       Comments: Mildly TTP  Musculoskeletal:        General: Normal range of motion.     Cervical back: Normal range of motion.  Skin:    General: Skin is warm and dry.     Findings: No rash.  Neurological:     Mental Status: She is alert.  Psychiatric:        Mood and Affect: Mood normal.      UC Treatments / Results  Labs (all labs ordered are listed, but only abnormal results are displayed) Labs Reviewed  POCT URINALYSIS DIP (DEVICE) - Abnormal; Notable for the following components:      Result Value   Bilirubin Urine SMALL (*)    Hgb urine dipstick SMALL (*)    Protein, ur 30 (*)    All other components within normal limits    EKG   Radiology No results found.  Procedures Procedures (including critical care time)  Medications Ordered in UC Medications - No data to display  Initial Impression / Assessment and Plan / UC Course  I have reviewed the triage vital signs and the nursing notes.  Pertinent labs & imaging results that were available during my care of the patient were reviewed by me and considered in my medical decision making (see chart for details).     Lower abdominal pain No acute abdomen on exam.  Generalized suprapubic tenderness Mildly tender.  No guarding or rebound. Urine with small hemoglobin and small bilirubin but otherwise negative.  No concerns for urinary tract infection.  Recommended push fluids. Most likely muscle strain in the abdominal area from lifting heavy few days prior. Recommended low-dose ibuprofen and Tylenol for pain Return and ER precautions given.  Final Clinical Impressions(s) / UC Diagnoses   Final diagnoses:  Lower abdominal pain     Discharge Instructions     Believe your pain is most likely related to the abdominal muscles and muscle strain. Your urine did not show any infection but you do appear to be mildly dehydrated.  Make sure you are drinking plenty of water You can try doing a low-dose of ibuprofen to see if this helps with your pain.  Otherwise you can take Tylenol If your pain worsens to include severe abdominal pain, nausea or vomiting you will need to go to the ER Follow up as needed for continued or worsening symptoms      ED  Prescriptions    None     PDMP not reviewed this encounter.   Loura Halt A, NP 09/05/19 1041

## 2019-09-18 ENCOUNTER — Telehealth: Payer: Self-pay

## 2019-09-18 NOTE — Telephone Encounter (Signed)
Telephone note already sent to [facility]

## 2019-09-18 NOTE — Telephone Encounter (Signed)
Pharmacy called. They wanted to confirm that patient should be taking birth control. Please advise.    Ph # [phone], pharmacy [facility], Ref # [id number]

## 2019-09-18 NOTE — Telephone Encounter (Signed)
[place], [facility] Ph # [phone], pharmacy [facility], Ref # [id number].  Wants to clarify why pt is taking birth control pills.  Please call back the abovementioned number to let them know why.

## 2019-09-19 ENCOUNTER — Encounter: Payer: Self-pay | Admitting: Family Medicine

## 2019-09-19 NOTE — Telephone Encounter (Signed)
Yes ---she is  Will discuss possibility of coming off of them next ov

## 2019-09-20 NOTE — Telephone Encounter (Signed)
Attempted call. Automatic system stated there was longer than normal wait times and states being 6th in line. Will try to call next week.

## 2019-09-24 ENCOUNTER — Ambulatory Visit (INDEPENDENT_AMBULATORY_CARE_PROVIDER_SITE_OTHER): Payer: Managed Care, Other (non HMO) | Admitting: Family Medicine

## 2019-09-24 ENCOUNTER — Other Ambulatory Visit: Payer: Self-pay

## 2019-09-24 ENCOUNTER — Encounter: Payer: Self-pay | Admitting: Family Medicine

## 2019-09-24 VITALS — BP 132/88 | HR 71 | Temp 97.5°F | Resp 18 | Ht 66.0 in | Wt 193.0 lb

## 2019-09-24 DIAGNOSIS — E785 Hyperlipidemia, unspecified: Secondary | ICD-10-CM | POA: Diagnosis not present

## 2019-09-24 DIAGNOSIS — I10 Essential (primary) hypertension: Secondary | ICD-10-CM | POA: Diagnosis not present

## 2019-09-24 DIAGNOSIS — E1169 Type 2 diabetes mellitus with other specified complication: Secondary | ICD-10-CM | POA: Diagnosis not present

## 2019-09-24 DIAGNOSIS — N912 Amenorrhea, unspecified: Secondary | ICD-10-CM | POA: Diagnosis not present

## 2019-09-24 LAB — FOLLICLE STIMULATING HORMONE: FSH: 0.5 m[IU]/mL

## 2019-09-24 LAB — COMPREHENSIVE METABOLIC PANEL
ALT: 15 U/L (ref 0–35)
AST: 15 U/L (ref 0–37)
Albumin: 3.4 g/dL — ABNORMAL LOW (ref 3.5–5.2)
Alkaline Phosphatase: 62 U/L (ref 39–117)
BUN: 16 mg/dL (ref 6–23)
CO2: 28 mEq/L (ref 19–32)
Calcium: 8.5 mg/dL (ref 8.4–10.5)
Chloride: 107 mEq/L (ref 96–112)
Creatinine, Ser: 1.07 mg/dL (ref 0.40–1.20)
GFR: 52.79 mL/min — ABNORMAL LOW (ref >60.00)
Glucose, Bld: 86 mg/dL (ref 70–99)
Potassium: 4.3 mEq/L (ref 3.5–5.1)
Sodium: 141 mEq/L (ref 135–145)
Total Bilirubin: 1 mg/dL (ref 0.2–1.2)
Total Protein: 6.2 g/dL (ref 6.0–8.3)

## 2019-09-24 LAB — LIPID PANEL
Cholesterol: 151 mg/dL (ref 0–200)
HDL: 40.4 mg/dL (ref >39.00)
LDL Cholesterol: 98 mg/dL (ref 0–99)
NonHDL: 110.55
Total CHOL/HDL Ratio: 4
Triglycerides: 63 mg/dL (ref 0.0–149.0)
VLDL: 12.6 mg/dL (ref 0.0–40.0)

## 2019-09-24 LAB — ESTRADIOL: Estradiol: 16 pg/mL

## 2019-09-24 LAB — LUTEINIZING HORMONE: LH: 0.53 m[IU]/mL

## 2019-09-24 NOTE — Assessment & Plan Note (Signed)
No period in years and pt on bcp Check labs today

## 2019-09-24 NOTE — Progress Notes (Signed)
Patient ID: Sandy Green, female    DOB: Feb 12, 1961  Age: 59 y.o. MRN: [id number]    Subjective:  Subjective  HPI Sandy Green presents for f/u bp and cholesterol   She is also asking about her bcp.   She has had no period in years and has remained on bcp.    No other complaints   Review of Systems  Constitutional: Negative for appetite change, diaphoresis, fatigue and unexpected weight change.  Eyes: Negative for pain, redness and visual disturbance.  Respiratory: Negative for cough, chest tightness, shortness of breath and wheezing.   Cardiovascular: Negative for chest pain, palpitations and leg swelling.  Endocrine: Negative for cold intolerance, heat intolerance, polydipsia, polyphagia and polyuria.  Genitourinary: Negative for difficulty urinating, dysuria and frequency.  Neurological: Negative for dizziness, light-headedness, numbness and headaches.    History Past Medical History:  Diagnosis Date  . Allergy   . Ankle fracture, left   . Anxiety   . Asthma   . Chicken pox   . Elbow fracture   . Heart murmur   . Hyperlipemia   . Hypertension     She has a past surgical history that includes Elbow fracture surgery (Left) and Wisdom tooth extraction.   Her family history includes Cancer in her maternal grandmother; Diabetes in her maternal grandmother; Heart disease in her father; Hypertension in her father and mother.She reports that she has never smoked. She has never used smokeless tobacco. She reports current alcohol use. She reports that she does not use drugs.  Current Outpatient Medications on File Prior to Visit  Medication Sig Dispense Refill  . atorvastatin (LIPITOR) 40 MG tablet Take 1 tablet (40 mg total) by mouth daily. 90 tablet 1  . azelastine (ASTELIN) 0.1 % nasal spray     . beclomethasone (QVAR REDIHALER) 80 MCG/ACT inhaler TAKE 2 PUFFS BY MOUTH TWICE A DAY 31.8 g 1  . carvedilol (COREG) 25 MG tablet Take 1 tablet (25 mg total) by mouth 2 (two) times daily with  a meal. 180 tablet 1  . escitalopram (LEXAPRO) 10 MG tablet Take 1 tablet (10 mg total) by mouth daily. 90 tablet 1  . fluticasone (FLONASE) 50 MCG/ACT nasal spray Place into both nostrils daily.    . irbesartan (AVAPRO) 150 MG tablet Take 150 mg by mouth daily.    . Levocetirizine Dihydrochloride (XYZAL ALLERGY 24HR PO) Take 1 tablet by mouth daily.    . montelukast (SINGULAIR) 10 MG tablet 1 po qd 90 tablet 1  . norethindrone-ethinyl estradiol-iron (JUNEL FE 1.5/30) 1.5-30 MG-MCG tablet Take 1 tablet by mouth daily. 84 tablet 1  . [DISCONTINUED] albuterol (PROVENTIL HFA;VENTOLIN HFA) 108 (90 BASE) MCG/ACT inhaler Inhale 1-2 puffs into the lungs every 6 (six) hours as needed for wheezing or shortness of breath. 1 Inhaler 0  . [DISCONTINUED] cloNIDine (CATAPRES) 0.1 MG tablet TAKE 1 TABLET (0.1 MG TOTAL) BY MOUTH 2 (TWO) TIMES DAILY. 180 tablet 1  . [DISCONTINUED] hydrochlorothiazide (HYDRODIURIL) 25 MG tablet Take 1 tablet (25 mg total) by mouth daily. 90 tablet 1   Current Facility-Administered Medications on File Prior to Visit  Medication Dose Route Frequency Provider Last Rate Last Admin  . 0.9 %  sodium chloride infusion  500 mL Intravenous Continuous Sandy Green, Sandy Raspberry, MD         Objective:  Objective  Physical Exam Vitals and nursing note reviewed.  Constitutional:      Appearance: She is well-developed.  HENT:     Head: Normocephalic and  atraumatic.  Eyes:     Conjunctiva/sclera: Conjunctivae normal.  Neck:     Thyroid: No thyromegaly.     Vascular: No carotid bruit or JVD.  Cardiovascular:     Rate and Rhythm: Normal rate and regular rhythm.     Heart sounds: Normal heart sounds. No murmur heard.   Pulmonary:     Effort: Pulmonary effort is normal. No respiratory distress.     Breath sounds: Normal breath sounds. No wheezing or rales.  Chest:     Chest wall: No tenderness.  Musculoskeletal:     Cervical back: Normal range of motion and neck supple.  Neurological:       Mental Status: She is alert and oriented to person, place, and time.    BP 132/88 (BP Location: Right Arm, Patient Position: Sitting, Cuff Size: Normal)   Pulse 71   Temp (!) 97.5 F (36.4 C) (Temporal)   Resp 18   Ht 5\' 6"  (1.676 m)   Wt 193 lb (87.5 kg)   SpO2 97%   BMI 31.15 kg/m  Wt Readings from Last 3 Encounters:  09/24/19 193 lb (87.5 kg)  09/04/19 192 lb (87.1 kg)  06/24/19 189 lb 8 oz (86 kg)     Lab Results  Component Value Date   WBC 9.7 06/24/2019   HGB 13.5 06/24/2019   HCT 40.9 06/24/2019   PLT 297.0 06/24/2019   GLUCOSE 69 (L) 06/24/2019   CHOL 160 06/24/2019   TRIG 110.0 06/24/2019   HDL 43.10 06/24/2019   LDLCALC 95 06/24/2019   ALT 11 06/24/2019   AST 14 06/24/2019   NA 138 06/24/2019   K 4.3 06/24/2019   CL 104 06/24/2019   CREATININE 1.49 (H) 06/24/2019   BUN 25 (H) 06/24/2019   CO2 29 06/24/2019   TSH 0.89 11/23/2017    No results found.   Assessment & Plan:  Plan  I am having Sandy Green maintain her Levocetirizine Dihydrochloride (XYZAL ALLERGY 24HR PO), carvedilol, atorvastatin, Qvar RediHaler, montelukast, norethindrone-ethinyl estradiol-iron, escitalopram, fluticasone, azelastine, and irbesartan. We will continue to administer sodium chloride.  No orders of the defined types were placed in this encounter.   Problem List Items Addressed This Visit      Unprioritized   Amenorrhea - Primary    No period in years and pt on bcp Check labs today       Relevant Orders   FSH   Estradiol   LH   Essential hypertension    Slightly high today but pt has not taken her meds Check labs  Dash diet       Relevant Medications   irbesartan (AVAPRO) 150 MG tablet   Hyperlipidemia    Encouraged heart healthy diet, increase exercise, avoid trans fats, consider a krill oil cap daily      Relevant Medications   irbesartan (AVAPRO) 150 MG tablet    Other Visit Diagnoses    Hyperlipidemia associated with type 2 diabetes mellitus  ([place])       Relevant Medications   irbesartan (AVAPRO) 150 MG tablet   Other Relevant Orders   Lipid panel   Comprehensive metabolic panel      Follow-up: Return in about 6 months (around 03/25/2020), or if symptoms worsen or fail to improve, for fasting, annual exam.  Ann Held, DO    +

## 2019-09-24 NOTE — Assessment & Plan Note (Signed)
Slightly high today but pt has not taken her meds Check labs  Dash diet

## 2019-09-24 NOTE — Patient Instructions (Signed)
DASH Eating Plan DASH stands for "Dietary Approaches to Stop Hypertension." The DASH eating plan is a healthy eating plan that has been shown to reduce high blood pressure (hypertension). It may also reduce your risk for type 2 diabetes, heart disease, and stroke. The DASH eating plan may also help with weight loss. What are tips for following this plan?  General guidelines  Avoid eating more than 2,300 mg (milligrams) of salt (sodium) a day. If you have hypertension, you may need to reduce your sodium intake to 1,500 mg a day.  Limit alcohol intake to no more than 1 drink a day for nonpregnant women and 2 drinks a day for men. One drink equals 12 oz of beer, 5 oz of wine, or 1 oz of hard liquor.  Work with your health care provider to maintain a healthy body weight or to lose weight. Ask what an ideal weight is for you.  Get at least 30 minutes of exercise that causes your heart to beat faster (aerobic exercise) most days of the week. Activities may include walking, swimming, or biking.  Work with your health care provider or diet and nutrition specialist (dietitian) to adjust your eating plan to your individual calorie needs. Reading food labels   Check food labels for the amount of sodium per serving. Choose foods with less than 5 percent of the Daily Value of sodium. Generally, foods with less than 300 mg of sodium per serving fit into this eating plan.  To find whole grains, look for the word "whole" as the first word in the ingredient list. Shopping  Buy products labeled as "low-sodium" or "no salt added."  Buy fresh foods. Avoid canned foods and premade or frozen meals. Cooking  Avoid adding salt when cooking. Use salt-free seasonings or herbs instead of table salt or sea salt. Check with your health care provider or pharmacist before using salt substitutes.  Do not fry foods. Cook foods using healthy methods such as baking, boiling, grilling, and broiling instead.  Cook with  heart-healthy oils, such as olive, canola, soybean, or sunflower oil. Meal planning  Eat a balanced diet that includes: ? 5 or more servings of fruits and vegetables each day. At each meal, try to fill half of your plate with fruits and vegetables. ? Up to 6-8 servings of whole grains each day. ? Less than 6 oz of lean meat, poultry, or fish each day. A 3-oz serving of meat is about the same size as a deck of cards. One egg equals 1 oz. ? 2 servings of low-fat dairy each day. ? A serving of nuts, seeds, or beans 5 times each week. ? Heart-healthy fats. Healthy fats called Omega-3 fatty acids are found in foods such as flaxseeds and coldwater fish, like sardines, salmon, and mackerel.  Limit how much you eat of the following: ? Canned or prepackaged foods. ? Food that is high in trans fat, such as fried foods. ? Food that is high in saturated fat, such as fatty meat. ? Sweets, desserts, sugary drinks, and other foods with added sugar. ? Full-fat dairy products.  Do not salt foods before eating.  Try to eat at least 2 vegetarian meals each week.  Eat more home-cooked food and less restaurant, buffet, and fast food.  When eating at a restaurant, ask that your food be prepared with less salt or no salt, if possible. What foods are recommended? The items listed may not be a complete list. Talk with your dietitian about   what dietary choices are best for you. Grains Whole-grain or whole-wheat bread. Whole-grain or whole-wheat pasta. Gratz rice. Oatmeal. Quinoa. Bulgur. Whole-grain and low-sodium cereals. Pita bread. Low-fat, low-sodium crackers. Whole-wheat flour tortillas. Vegetables Fresh or frozen vegetables (raw, steamed, roasted, or grilled). Low-sodium or reduced-sodium tomato and vegetable juice. Low-sodium or reduced-sodium tomato sauce and tomato paste. Low-sodium or reduced-sodium canned vegetables. Fruits All fresh, dried, or frozen fruit. Canned fruit in natural juice (without  added sugar). Meat and other protein foods Skinless chicken or turkey. Ground chicken or turkey. Pork with fat trimmed off. Fish and seafood. Egg whites. Dried beans, peas, or lentils. Unsalted nuts, nut butters, and seeds. Unsalted canned beans. Lean cuts of beef with fat trimmed off. Low-sodium, lean deli meat. Dairy Low-fat (1%) or fat-free (skim) milk. Fat-free, low-fat, or reduced-fat cheeses. Nonfat, low-sodium ricotta or cottage cheese. Low-fat or nonfat yogurt. Low-fat, low-sodium cheese. Fats and oils Soft margarine without trans fats. Vegetable oil. Low-fat, reduced-fat, or light mayonnaise and salad dressings (reduced-sodium). Canola, safflower, olive, soybean, and sunflower oils. Avocado. Seasoning and other foods Herbs. Spices. Seasoning mixes without salt. Unsalted popcorn and pretzels. Fat-free sweets. What foods are not recommended? The items listed may not be a complete list. Talk with your dietitian about what dietary choices are best for you. Grains Baked goods made with fat, such as croissants, muffins, or some breads. Dry pasta or rice meal packs. Vegetables Creamed or fried vegetables. Vegetables in a cheese sauce. Regular canned vegetables (not low-sodium or reduced-sodium). Regular canned tomato sauce and paste (not low-sodium or reduced-sodium). Regular tomato and vegetable juice (not low-sodium or reduced-sodium). Pickles. Olives. Fruits Canned fruit in a light or heavy syrup. Fried fruit. Fruit in cream or butter sauce. Meat and other protein foods Fatty cuts of meat. Ribs. Fried meat. Bacon. Sausage. Bologna and other processed lunch meats. Salami. Fatback. Hotdogs. Bratwurst. Salted nuts and seeds. Canned beans with added salt. Canned or smoked fish. Whole eggs or egg yolks. Chicken or turkey with skin. Dairy Whole or 2% milk, cream, and half-and-half. Whole or full-fat cream cheese. Whole-fat or sweetened yogurt. Full-fat cheese. Nondairy creamers. Whipped toppings.  Processed cheese and cheese spreads. Fats and oils Butter. Stick margarine. Lard. Shortening. Ghee. Bacon fat. Tropical oils, such as coconut, palm kernel, or palm oil. Seasoning and other foods Salted popcorn and pretzels. Onion salt, garlic salt, seasoned salt, table salt, and sea salt. Worcestershire sauce. Tartar sauce. Barbecue sauce. Teriyaki sauce. Soy sauce, including reduced-sodium. Steak sauce. Canned and packaged gravies. Fish sauce. Oyster sauce. Cocktail sauce. Horseradish that you find on the shelf. Ketchup. Mustard. Meat flavorings and tenderizers. Bouillon cubes. Hot sauce and Tabasco sauce. Premade or packaged marinades. Premade or packaged taco seasonings. Relishes. Regular salad dressings. Where to find more information:  National Heart, Lung, and Blood Institute: www.nhlbi.nih.gov  American Heart Association: www.heart.org Summary  The DASH eating plan is a healthy eating plan that has been shown to reduce high blood pressure (hypertension). It may also reduce your risk for type 2 diabetes, heart disease, and stroke.  With the DASH eating plan, you should limit salt (sodium) intake to 2,300 mg a day. If you have hypertension, you may need to reduce your sodium intake to 1,500 mg a day.  When on the DASH eating plan, aim to eat more fresh fruits and vegetables, whole grains, lean proteins, low-fat dairy, and heart-healthy fats.  Work with your health care provider or diet and nutrition specialist (dietitian) to adjust your eating plan to your   individual calorie needs. This information is not intended to replace advice given to you by your health care provider. Make sure you discuss any questions you have with your health care provider. Document Revised: 03/03/2017 Document Reviewed: 03/14/2016 Elsevier Patient Education  2020 [employer]  

## 2019-09-24 NOTE — Assessment & Plan Note (Signed)
Encouraged heart healthy diet, increase exercise, avoid trans fats, consider a krill oil cap daily 

## 2019-10-10 ENCOUNTER — Other Ambulatory Visit: Payer: Self-pay | Admitting: Family Medicine

## 2019-10-10 DIAGNOSIS — Z3041 Encounter for surveillance of contraceptive pills: Secondary | ICD-10-CM

## 2019-10-16 ENCOUNTER — Other Ambulatory Visit: Payer: Self-pay | Admitting: Family Medicine

## 2019-10-16 DIAGNOSIS — I10 Essential (primary) hypertension: Secondary | ICD-10-CM

## 2020-01-06 ENCOUNTER — Other Ambulatory Visit: Payer: Self-pay | Admitting: Family Medicine

## 2020-01-07 ENCOUNTER — Other Ambulatory Visit: Payer: Self-pay | Admitting: Family Medicine

## 2020-01-07 DIAGNOSIS — J4520 Mild intermittent asthma, uncomplicated: Secondary | ICD-10-CM

## 2020-01-07 DIAGNOSIS — E785 Hyperlipidemia, unspecified: Secondary | ICD-10-CM

## 2020-02-10 ENCOUNTER — Encounter: Payer: Self-pay | Admitting: Family Medicine

## 2020-02-10 DIAGNOSIS — I10 Essential (primary) hypertension: Secondary | ICD-10-CM

## 2020-02-11 MED ORDER — CARVEDILOL 25 MG PO TABS: 25.0000 mg | ORAL_TABLET | Freq: Two times a day (BID) | ORAL | 1 refills | Status: DC

## 2020-02-18 ENCOUNTER — Other Ambulatory Visit (HOSPITAL_BASED_OUTPATIENT_CLINIC_OR_DEPARTMENT_OTHER): Payer: Self-pay

## 2020-02-18 DIAGNOSIS — R0683 Snoring: Secondary | ICD-10-CM

## 2020-03-06 ENCOUNTER — Other Ambulatory Visit: Payer: Self-pay

## 2020-03-06 ENCOUNTER — Ambulatory Visit (INDEPENDENT_AMBULATORY_CARE_PROVIDER_SITE_OTHER): Payer: Managed Care, Other (non HMO) | Admitting: Podiatry

## 2020-03-06 DIAGNOSIS — L600 Ingrowing nail: Secondary | ICD-10-CM | POA: Diagnosis not present

## 2020-03-06 DIAGNOSIS — M722 Plantar fascial fibromatosis: Secondary | ICD-10-CM

## 2020-03-06 MED ORDER — TRIAMCINOLONE ACETONIDE 10 MG/ML IJ SUSP
10.0000 mg | Freq: Once | INTRAMUSCULAR | Status: AC
  Administered 2020-03-06: 10 mg

## 2020-03-06 NOTE — Patient Instructions (Addendum)
Plantar Fasciitis (Heel Spur Syndrome) with Rehab The plantar fascia is a fibrous, ligament-like, soft-tissue structure that spans the bottom of the foot. Plantar fasciitis is a condition that causes pain in the foot due to inflammation of the tissue. SYMPTOMS   Pain and tenderness on the underneath side of the foot.  Pain that worsens with standing or walking. CAUSES  Plantar fasciitis is caused by irritation and injury to the plantar fascia on the underneath side of the foot. Common mechanisms of injury include:  Direct trauma to bottom of the foot.  Damage to a small nerve that runs under the foot where the main fascia attaches to the heel bone.  Stress placed on the plantar fascia due to bone spurs. RISK INCREASES WITH:   Activities that place stress on the plantar fascia (running, jumping, pivoting, or cutting).  Poor strength and flexibility.  Improperly fitted shoes.  Tight calf muscles.  Flat feet.  Failure to warm-up properly before activity.  Obesity. PREVENTION  Warm up and stretch properly before activity.  Allow for adequate recovery between workouts.  Maintain physical fitness:  Strength, flexibility, and endurance.  Cardiovascular fitness.  Maintain a health body weight.  Avoid stress on the plantar fascia.  Wear properly fitted shoes, including arch supports for individuals who have flat feet.  PROGNOSIS  If treated properly, then the symptoms of plantar fasciitis usually resolve without surgery. However, occasionally surgery is necessary.  RELATED COMPLICATIONS   Recurrent symptoms that may result in a chronic condition.  Problems of the lower back that are caused by compensating for the injury, such as limping.  Pain or weakness of the foot during push-off following surgery.  Chronic inflammation, scarring, and partial or complete fascia tear, occurring more often from repeated injections.  TREATMENT  Treatment initially involves the  use of ice and medication to help reduce pain and inflammation. The use of strengthening and stretching exercises may help reduce pain with activity, especially stretches of the Achilles tendon. These exercises may be performed at home or with a therapist. Your caregiver may recommend that you use heel cups of arch supports to help reduce stress on the plantar fascia. Occasionally, corticosteroid injections are given to reduce inflammation. If symptoms persist for greater than 6 months despite non-surgical (conservative), then surgery may be recommended.   MEDICATION   If pain medication is necessary, then nonsteroidal anti-inflammatory medications, such as aspirin and ibuprofen, or other minor pain relievers, such as acetaminophen, are often recommended.  Do not take pain medication within 7 days before surgery.  Prescription pain relievers may be given if deemed necessary by your caregiver. Use only as directed and only as much as you need.  Corticosteroid injections may be given by your caregiver. These injections should be reserved for the most serious cases, because they may only be given a certain number of times.  HEAT AND COLD  Cold treatment (icing) relieves pain and reduces inflammation. Cold treatment should be applied for 10 to 15 minutes every 2 to 3 hours for inflammation and pain and immediately after any activity that aggravates your symptoms. Use ice packs or massage the area with a piece of ice (ice massage).  Heat treatment may be used prior to performing the stretching and strengthening activities prescribed by your caregiver, physical therapist, or athletic trainer. Use a heat pack or soak the injury in warm water.  SEEK IMMEDIATE MEDICAL CARE IF:  Treatment seems to offer no benefit, or the condition worsens.  Any medications   produce adverse side effects.  EXERCISES- RANGE OF MOTION (ROM) AND STRETCHING EXERCISES - Plantar Fasciitis (Heel Spur Syndrome) These exercises  may help you when beginning to rehabilitate your injury. Your symptoms may resolve with or without further involvement from your physician, physical therapist or athletic trainer. While completing these exercises, remember:   Restoring tissue flexibility helps normal motion to return to the joints. This allows healthier, less painful movement and activity.  An effective stretch should be held for at least 30 seconds.  A stretch should never be painful. You should only feel a gentle lengthening or release in the stretched tissue.  RANGE OF MOTION - Toe Extension, Flexion  Sit with your right / left leg crossed over your opposite knee.  Grasp your toes and gently pull them back toward the top of your foot. You should feel a stretch on the bottom of your toes and/or foot.  Hold this stretch for 10 seconds.  Now, gently pull your toes toward the bottom of your foot. You should feel a stretch on the top of your toes and or foot.  Hold this stretch for 10 seconds. Repeat  times. Complete this stretch 3 times per day.   RANGE OF MOTION - Ankle Dorsiflexion, Active Assisted  Remove shoes and sit on a chair that is preferably not on a carpeted surface.  Place right / left foot under knee. Extend your opposite leg for support.  Keeping your heel down, slide your right / left foot back toward the chair until you feel a stretch at your ankle or calf. If you do not feel a stretch, slide your bottom forward to the edge of the chair, while still keeping your heel down.  Hold this stretch for 10 seconds. Repeat 3 times. Complete this stretch 2 times per day.   STRETCH  Gastroc, Standing  Place hands on wall.  Extend right / left leg, keeping the front knee somewhat bent.  Slightly point your toes inward on your back foot.  Keeping your right / left heel on the floor and your knee straight, shift your weight toward the wall, not allowing your back to arch.  You should feel a gentle stretch  in the right / left calf. Hold this position for 10 seconds. Repeat 3 times. Complete this stretch 2 times per day.  STRETCH  Soleus, Standing  Place hands on wall.  Extend right / left leg, keeping the other knee somewhat bent.  Slightly point your toes inward on your back foot.  Keep your right / left heel on the floor, bend your back knee, and slightly shift your weight over the back leg so that you feel a gentle stretch deep in your back calf.  Hold this position for 10 seconds. Repeat 3 times. Complete this stretch 2 times per day.  STRETCH  Gastrocsoleus, Standing  Note: This exercise can place a lot of stress on your foot and ankle. Please complete this exercise only if specifically instructed by your caregiver.   Place the ball of your right / left foot on a step, keeping your other foot firmly on the same step.  Hold on to the wall or a rail for balance.  Slowly lift your other foot, allowing your body weight to press your heel down over the edge of the step.  You should feel a stretch in your right / left calf.  Hold this position for 10 seconds.  Repeat this exercise with a slight bend in your right /   left knee. Repeat 3 times. Complete this stretch 2 times per day.   STRENGTHENING EXERCISES - Plantar Fasciitis (Heel Spur Syndrome)  These exercises may help you when beginning to rehabilitate your injury. They may resolve your symptoms with or without further involvement from your physician, physical therapist or athletic trainer. While completing these exercises, remember:   Muscles can gain both the endurance and the strength needed for everyday activities through controlled exercises.  Complete these exercises as instructed by your physician, physical therapist or athletic trainer. Progress the resistance and repetitions only as guided.  STRENGTH - Towel Curls  Sit in a chair positioned on a non-carpeted surface.  Place your foot on a towel, keeping your heel  on the floor.  Pull the towel toward your heel by only curling your toes. Keep your heel on the floor. Repeat 3 times. Complete this exercise 2 times per day.  STRENGTH - Ankle Inversion  Secure one end of a rubber exercise band/tubing to a fixed object (table, pole). Loop the other end around your foot just before your toes.  Place your fists between your knees. This will focus your strengthening at your ankle.  Slowly, pull your big toe up and in, making sure the band/tubing is positioned to resist the entire motion.  Hold this position for 10 seconds.  Have your muscles resist the band/tubing as it slowly pulls your foot back to the starting position. Repeat 3 times. Complete this exercises 2 times per day.  Document Released: 03/21/2005 Document Revised: 06/13/2011 Document Reviewed: 07/03/2008 [facility] Patient Information 2014 [place], [place].  Place 1/4 cup of epsom salts in a quart of warm tap water.  Submerge your foot or feet in the solution and soak for 20 minutes.  This soak should be done twice a day.  Next, remove your foot or feet from solution, blot dry the affected area. Apply ointment and cover if instructed by your doctor.   IF YOUR SKIN BECOMES IRRITATED WHILE USING THESE INSTRUCTIONS, IT IS OKAY TO SWITCH TO  WHITE VINEGAR AND WATER.  As another alternative soak, you may use antibacterial soap and water.  Monitor for any signs/symptoms of infection. Call the office immediately if any occur or go directly to the emergency room. Call with any questions/concerns.

## 2020-03-09 NOTE — Progress Notes (Signed)
Subjective:   Patient ID: Sandy Green, female   DOB: 59 y.o.   MRN: [id number]   HPI patient presents with 2 problems with 1 being an ingrown toenail of the left big toe sore and the other being discomfort in the plantar aspect of the right heel that has reoccurred.  States the ingrown makes it hard for her to wear shoe gear and she is tried to soak it and trim it   ROS      Objective:  Physical Exam  Neurovascular status intact with patient's left hallux medial border incurvated and sore in the corner and the right heel sore with palpation of the medial band     Assessment:  Ingrown toenail deformity left hallux painful medial border and plantar fasciitis right     Plan:  H&P and reviewed condition with patient.  At this point I recommended correction of the ingrown toenail and I went ahead did sterile prep and injected the plantar fascia right 3 mg Kenalog 5 mg Xylocaine.  For the ingrown I allowed her to sign consent form understanding risk and I infiltrated 60 mg like Marcaine mixture sterile prep done and using sterile instrumentation remove border exposed matrix applied phenol 3 applications 30 seconds followed by alcohol lavage.  I then went ahead applied sterile dressing gave instructions on leaving dressing on 24 hours but taking off earlier if any throbbing were to occur and patient will be seen back again in the next several weeks and is encouraged to call with questions concerns

## 2020-03-13 ENCOUNTER — Other Ambulatory Visit: Payer: Self-pay | Admitting: Family Medicine

## 2020-03-13 DIAGNOSIS — I10 Essential (primary) hypertension: Secondary | ICD-10-CM

## 2020-03-13 DIAGNOSIS — F419 Anxiety disorder, unspecified: Secondary | ICD-10-CM

## 2020-03-16 ENCOUNTER — Ambulatory Visit (HOSPITAL_BASED_OUTPATIENT_CLINIC_OR_DEPARTMENT_OTHER): Payer: Managed Care, Other (non HMO) | Attending: Otolaryngology | Admitting: Internal Medicine

## 2020-03-16 ENCOUNTER — Other Ambulatory Visit: Payer: Self-pay

## 2020-03-16 DIAGNOSIS — R0683 Snoring: Secondary | ICD-10-CM

## 2020-03-22 DIAGNOSIS — R0683 Snoring: Secondary | ICD-10-CM

## 2020-03-22 NOTE — Procedures (Signed)
   Patient Name: Sandy Green, Sandy Green Date: 03/17/2020 Gender: Female D.O.B: 08/26/1960 Age (years): 71 Referring Provider: Leta Baptist Height (inches): 66 Interpreting Physician: Baird Lyons MD, ABSM Weight (lbs): 188 RPSGT: Jacolyn Reedy BMI: 30 MRN: [id number] Neck Size: 14.00  CLINICAL INFORMATION Sleep Study Type: HST Indication for sleep study: Snoring Epworth Sleepiness Score: 6  SLEEP STUDY TECHNIQUE A multi-channel overnight portable sleep study was performed. The channels recorded were: nasal airflow, thoracic respiratory movement, and oxygen saturation with a pulse oximetry. Snoring was also monitored.  MEDICATIONS Patient self administered medications include: none reported  SLEEP ARCHITECTURE Patient was studied for 377.5 minutes. The sleep efficiency was 100.0 % and the patient was supine for 0%. The arousal index was 0.0 per hour.  RESPIRATORY PARAMETERS The overall AHI was 12.6 per hour, with a central apnea index of 0.3 per hour. The oxygen nadir was 83% during sleep.  CARDIAC DATA Mean heart rate during sleep was 67.1 bpm.  IMPRESSIONS - Mild obstructive sleep apnea occurred during this study (AHI = 12.6/h). - No significant central sleep apnea occurred during this study (CAI = 0.3/h). - Oxygen desaturation was noted during this study (Min O2 = 83%). Mean O2 sat 92%. - Patient snored.  DIAGNOSIS - Obstructive Sleep Apnea (G47.33)  RECOMMENDATIONS - Common options in this score range include CPAP titration or autopap, a fitted oral appliance, or ENT sugical management, based on clinical judgment. - Be careful with alcohol, sedatives and other CNS depressants that may worsen sleep apnea and disrupt normal sleep architecture. - Sleep hygiene should be reviewed to assess factors that may improve sleep quality. - Weight management and regular exercise should be initiated or continued.  [Electronically signed] 03/22/2020 12:00 PM  Baird Lyons MD,  [place], American Board of Sleep Medicine   NPI: [id number]                          [place], [place] [facility]  ELECTRONICALLY SIGNED ON:  03/22/2020, 11:57 AM [place] PH: [phone]   FX: [phone] [place]

## 2020-04-08 ENCOUNTER — Encounter: Payer: Self-pay | Admitting: Family Medicine

## 2020-04-13 ENCOUNTER — Other Ambulatory Visit: Payer: Self-pay | Admitting: Family Medicine

## 2020-04-13 ENCOUNTER — Other Ambulatory Visit: Payer: Self-pay

## 2020-04-13 DIAGNOSIS — J4520 Mild intermittent asthma, uncomplicated: Secondary | ICD-10-CM

## 2020-04-13 MED ORDER — IRBESARTAN 150 MG PO TABS: 150.0000 mg | ORAL_TABLET | Freq: Every day | ORAL | 1 refills | Status: DC

## 2020-04-13 MED ORDER — MONTELUKAST SODIUM 10 MG PO TABS: ORAL_TABLET | ORAL | 3 refills | Status: DC

## 2020-04-13 NOTE — Telephone Encounter (Signed)
150 ---  but she was due for ov in Dec

## 2020-05-01 ENCOUNTER — Other Ambulatory Visit: Payer: Self-pay

## 2020-05-01 ENCOUNTER — Ambulatory Visit: Payer: Managed Care, Other (non HMO) | Admitting: Podiatry

## 2020-05-01 DIAGNOSIS — M722 Plantar fascial fibromatosis: Secondary | ICD-10-CM | POA: Diagnosis not present

## 2020-05-01 MED ORDER — TRIAMCINOLONE ACETONIDE 10 MG/ML IJ SUSP
10.0000 mg | Freq: Once | INTRAMUSCULAR | Status: AC
  Administered 2020-05-01: 10 mg

## 2020-05-03 NOTE — Progress Notes (Signed)
Subjective:   Patient ID: Sandy Green, female   DOB: 60 y.o.   MRN: [id number]   HPI Patient states she was doing fine but she has had a reoccurrence of pain in the right plantar heel   ROS      Objective:  Physical Exam  Neurovascular status intact with exquisite discomfort noted right plantar heel at the insertional point that reoccurred but she has been very much more active over the last few weeks     Assessment:  Acute reoccurrence plantar fasciitis right     Plan:  Reviewed condition recommended treating it conservatively and today I did sterile prep and reinjected the fascia 3 mg Kenalog 5 mg Xylocaine and patient will be seen back to recheck

## 2020-05-04 ENCOUNTER — Encounter: Payer: Self-pay | Admitting: Pulmonary Disease

## 2020-05-04 ENCOUNTER — Ambulatory Visit: Payer: Managed Care, Other (non HMO) | Admitting: Pulmonary Disease

## 2020-05-04 ENCOUNTER — Other Ambulatory Visit: Payer: Self-pay

## 2020-05-04 VITALS — BP 132/84 | HR 71 | Temp 97.7°F | Ht 66.0 in | Wt 190.0 lb

## 2020-05-04 DIAGNOSIS — G4733 Obstructive sleep apnea (adult) (pediatric): Secondary | ICD-10-CM | POA: Diagnosis not present

## 2020-05-04 NOTE — Progress Notes (Signed)
Sandy Green    [id number]    Oct 27, 1960  Primary Care Physician:Lowne Cheri Rous Alferd Apa, DO  Referring Physician: Carollee Herter, Alferd Apa, DO [address] [place],  [place] [phone]  Chief complaint:   Patient with mild obstructive sleep apnea  HPI:  Patient recently had a home sleep study which revealed mild obstructive sleep apnea with AHI of 12.6  Admits to snoring Some daytime fatigue Does suffer from a lot of allergies, does have pet rabbits which she is sensitive to  Usually goes to bed at about 1 AM Wake up time about 9-10 AM Usually wakes up feeling like is that a decent rest  She is really concerned about health problems  Does have a history of hypertension, asthma, hypercholesterolemia  Outpatient Encounter Medications as of 05/04/2020  Medication Sig  . atorvastatin (LIPITOR) 40 MG tablet TAKE 1 TABLET DAILY  . azelastine (ASTELIN) 0.1 % nasal spray   . beclomethasone (QVAR REDIHALER) 80 MCG/ACT inhaler USE 2 INHALATIONS TWICE A DAY  . carvedilol (COREG) 25 MG tablet Take 1 tablet (25 mg total) by mouth 2 (two) times daily with a meal.  . escitalopram (LEXAPRO) 10 MG tablet TAKE 1 TABLET DAILY  . fluticasone (FLONASE) 50 MCG/ACT nasal spray Place into both nostrils daily.  . irbesartan (AVAPRO) 150 MG tablet Take 1 tablet (150 mg total) by mouth daily.  . Levocetirizine Dihydrochloride (XYZAL ALLERGY 24HR PO) Take 1 tablet by mouth daily.  . montelukast (SINGULAIR) 10 MG tablet TAKE 1 TABLET DAILY  . norethindrone-ethinyl estradiol-iron (JUNEL FE 1.5/30) 1.5-30 MG-MCG tablet Take 1 tablet by mouth daily.  . [DISCONTINUED] albuterol (PROVENTIL HFA;VENTOLIN HFA) 108 (90 BASE) MCG/ACT inhaler Inhale 1-2 puffs into the lungs every 6 (six) hours as needed for wheezing or shortness of breath.  . [DISCONTINUED] cloNIDine (CATAPRES) 0.1 MG tablet TAKE 1 TABLET (0.1 MG TOTAL) BY MOUTH 2 (TWO) TIMES DAILY.  . [DISCONTINUED] hydrochlorothiazide  (HYDRODIURIL) 25 MG tablet Take 1 tablet (25 mg total) by mouth daily.   Facility-Administered Encounter Medications as of 05/04/2020  Medication  . 0.9 %  sodium chloride infusion    Allergies as of 05/04/2020 - Review Complete 05/04/2020  Allergen Reaction Noted  . Amlodipine  07/11/2017  . Naproxen  07/11/2017  . Penicillins Rash and Other (See Comments) 02/18/2013  . Tetracyclines & related Nausea Only and Rash 02/27/2013    Past Medical History:  Diagnosis Date  . Allergy   . Ankle fracture, left   . Anxiety   . Asthma   . Chicken pox   . Elbow fracture   . Heart murmur   . Hyperlipemia   . Hypertension     Past Surgical History:  Procedure Laterality Date  . ELBOW FRACTURE SURGERY Left   . WISDOM TOOTH EXTRACTION      Family History  Problem Relation Age of Onset  . Hypertension Mother   . Hypertension Father   . Heart disease Father        cabg.6  . Diabetes Maternal Grandmother   . Cancer Maternal Grandmother        Unsure of kind? Breast  . Colon cancer Neg Hx   . Esophageal cancer Neg Hx   . Rectal cancer Neg Hx   . Stomach cancer Neg Hx     Social History   Socioeconomic History  . Marital status: Married    Spouse name: Not on file  . Number  of children: Not on file  . Years of education: Not on file  . Highest education level: Not on file  Occupational History  . Not on file  Tobacco Use  . Smoking status: Never Smoker  . Smokeless tobacco: Never Used  Substance and Sexual Activity  . Alcohol use: Yes    Alcohol/week: 0.0 standard drinks    Comment: Rare  . Drug use: No  . Sexual activity: Yes    Partners: Male  Other Topics Concern  . Not on file  Social History Narrative   Exercise-- no    Married,lives with husband.   Unemployed.   Social Determinants of Health   Financial Resource Strain: Not on file  Food Insecurity: Not on file  Transportation Needs: Not on file  Physical Activity: Not on file  Stress: Not on file   Social Connections: Not on file  Intimate Partner Violence: Not on file    Review of Systems  Constitutional: Negative for fatigue.  Respiratory: Positive for apnea. Negative for shortness of breath.   Psychiatric/Behavioral: Positive for sleep disturbance.    Vitals:   05/04/20 1343  BP: 132/84  Pulse: 71  Temp: 97.7 F (36.5 C)  SpO2: 98%     Physical Exam Constitutional:      Appearance: She is obese.  HENT:     Head: Normocephalic.     Mouth/Throat:     Mouth: Mucous membranes are moist.  Eyes:     Pupils: Pupils are equal, round, and reactive to light.  Cardiovascular:     Rate and Rhythm: Normal rate and regular rhythm.     Heart sounds: No murmur heard. No friction rub.  Pulmonary:     Effort: Pulmonary effort is normal. No respiratory distress.     Breath sounds: Normal breath sounds. No wheezing or rhonchi.  Musculoskeletal:     Cervical back: No rigidity or tenderness.  Neurological:     Mental Status: She is alert.  Psychiatric:        Mood and Affect: Mood normal.    Data Reviewed: Sleep study reviewed with the patient showing an AHI of 12.6, mild oxygen desaturations  Assessment:  Mild obstructive sleep apnea with significant daytime sleepiness  Nonrestorative sleep  Obesity  Pathophysiology of sleep disordered breathing discussed with the patient Treatment options for sleep disordered breathing discussed with the patient   Plan/Recommendations: We will initiate CPAP treatment  Patient will continue to focus on weight loss and regular exercise  Encouraged to call with any significant concerns  Tentative follow-up in about 3 to 4 months   Sherrilyn Rist MD [facility] 05/04/2020, 2:18 PM  CC: Ann Held, *

## 2020-05-04 NOTE — Patient Instructions (Signed)
DME referral for CPAP  Auto CPAP settings of 5-15 Patients mask of choice   Graded exercise as tolerated Aggressive weight loss efforts   I will see you back in about 3 to 4 months  Call with significant concerns

## 2020-06-11 ENCOUNTER — Other Ambulatory Visit: Payer: Self-pay | Admitting: Family Medicine

## 2020-06-11 DIAGNOSIS — F419 Anxiety disorder, unspecified: Secondary | ICD-10-CM

## 2020-06-26 ENCOUNTER — Other Ambulatory Visit: Payer: Self-pay | Admitting: Family Medicine

## 2020-06-26 DIAGNOSIS — J4520 Mild intermittent asthma, uncomplicated: Secondary | ICD-10-CM

## 2020-07-17 ENCOUNTER — Other Ambulatory Visit: Payer: Self-pay | Admitting: Family Medicine

## 2020-07-17 DIAGNOSIS — I10 Essential (primary) hypertension: Secondary | ICD-10-CM

## 2020-09-09 ENCOUNTER — Other Ambulatory Visit: Payer: Self-pay | Admitting: Family Medicine

## 2020-09-09 DIAGNOSIS — F419 Anxiety disorder, unspecified: Secondary | ICD-10-CM

## 2020-09-17 ENCOUNTER — Other Ambulatory Visit: Payer: Self-pay | Admitting: Family Medicine

## 2020-09-28 ENCOUNTER — Ambulatory Visit (HOSPITAL_COMMUNITY)
Admission: EM | Admit: 2020-09-28 | Discharge: 2020-09-28 | Disposition: A | Discharge status: Home / Self Care | Payer: Managed Care, Other (non HMO) | Attending: Emergency Medicine | Admitting: Emergency Medicine

## 2020-09-28 ENCOUNTER — Encounter (HOSPITAL_COMMUNITY): Payer: Self-pay

## 2020-09-28 DIAGNOSIS — T148XXA Other injury of unspecified body region, initial encounter: Secondary | ICD-10-CM

## 2020-09-28 MED ORDER — METHOCARBAMOL 500 MG PO TABS
500.0000 mg | ORAL_TABLET | Freq: Two times a day (BID) | ORAL | 0 refills | Status: DC
Start: 2020-09-28 — End: 2020-10-29

## 2020-09-28 NOTE — ED Triage Notes (Signed)
Pt reports right sided back pain x 2 days. "Feels like muscle strain". Reporst she being moving furniture in her moms house. Ibuprofen gives no relief. Denies dysuria.

## 2020-09-28 NOTE — ED Provider Notes (Signed)
[facility]    CSN: [id number] Arrival date & time: 09/28/20  1131      History   Chief Complaint Chief Complaint  Patient presents with   Back Pain    HPI Sandy Green is a 60 y.o. female.   Pt is here was moving boxes 2 days  ago and has a sore muscle to rt side of mid back area. None midline. No sob, no chest pain,. Has taken motrin pta with minimal relief. No cv tenderness. No urinary sx.    Past Medical History:  Diagnosis Date   Allergy    Ankle fracture, left    Anxiety    Asthma    Chicken pox    Elbow fracture    Heart murmur    Hyperlipemia    Hypertension     Patient Active Problem List   Diagnosis Date Noted   Amenorrhea 09/24/2019   Anxiety 05/28/2018   Left wrist pain 09/21/2015   Pain of left heel 09/21/2015   Vaginitis and vulvovaginitis 03/23/2015   Rash and nonspecific skin eruption 01/01/2015   Essential hypertension 06/16/2014   Conjunctivitis 04/09/2014   Hyperlipidemia 02/28/2013   Multiple allergies 02/28/2013   Asthma, mild intermittent 02/28/2013   Obesity (BMI 30-39.9) 02/27/2013    Past Surgical History:  Procedure Laterality Date   ELBOW FRACTURE SURGERY Left    WISDOM TOOTH EXTRACTION      OB History   No obstetric history on file.      Home Medications    Prior to Admission medications   Medication Sig Start Date End Date Taking? Authorizing Provider  methocarbamol (ROBAXIN) 500 MG tablet Take 1 tablet (500 mg total) by mouth 2 (two) times daily. 09/28/20  Yes Marney Setting, NP  atorvastatin (LIPITOR) 40 MG tablet TAKE 1 TABLET DAILY 01/07/20   Carollee Herter, Alferd Apa, DO  azelastine (ASTELIN) 0.1 % nasal spray  08/22/19   [provider]  beclomethasone (QVAR REDIHALER) 80 MCG/ACT inhaler USE 2 INHALATIONS TWICE A DAY 01/06/20   Carollee Herter, Alferd Apa, DO  carvedilol (COREG) 25 MG tablet Take 1 tablet (25 mg total) by mouth 2 (two) times daily with a meal. 07/20/20   Carollee Herter, Yvonne R, DO   escitalopram (LEXAPRO) 10 MG tablet TAKE 1 TABLET DAILY (DUE FOR FOLLOW UP WITH PRIMARY CARE PHYSICIAN) 09/09/20   Carollee Herter, Alferd Apa, DO  fluticasone (FLONASE) 50 MCG/ACT nasal spray Place into both nostrils daily.    [provider]  irbesartan (AVAPRO) 150 MG tablet Take 1 tablet (150 mg total) by mouth daily. 09/17/20   Ann Held, DO  Levocetirizine Dihydrochloride (XYZAL ALLERGY 24HR PO) Take 1 tablet by mouth daily.    [provider]  montelukast (SINGULAIR) 10 MG tablet TAKE 1 TABLET BY MOUTH EVERY DAY 06/26/20   Carollee Herter, Alferd Apa, DO  norethindrone-ethinyl estradiol-iron (JUNEL FE 1.5/30) 1.5-30 MG-MCG tablet Take 1 tablet by mouth daily. 08/23/19   [name] R, DO  albuterol (PROVENTIL HFA;VENTOLIN HFA) 108 (90 BASE) MCG/ACT inhaler Inhale 1-2 puffs into the lungs every 6 (six) hours as needed for wheezing or shortness of breath. 05/03/14 09/04/19  Harden Mo, MD  cloNIDine (CATAPRES) 0.1 MG tablet TAKE 1 TABLET (0.1 MG TOTAL) BY MOUTH 2 (TWO) TIMES DAILY. 07/22/19 09/04/19  Ann Held, DO  hydrochlorothiazide (HYDRODIURIL) 25 MG tablet Take 1 tablet (25 mg total) by mouth daily. 06/14/19 09/04/19  Ann Held, DO  Family History Family History  Problem Relation Age of Onset   Hypertension Mother    Hypertension Father    Heart disease Father        cabg.6   Diabetes Maternal Grandmother    Cancer Maternal Grandmother        Unsure of kind? Breast   Colon cancer Neg Hx    Esophageal cancer Neg Hx    Rectal cancer Neg Hx    Stomach cancer Neg Hx     Social History Social History   Tobacco Use   Smoking status: Never   Smokeless tobacco: Never  Substance Use Topics   Alcohol use: Yes    Alcohol/week: 0.0 standard drinks    Comment: Rare   Drug use: No     Allergies   Amlodipine, Naproxen, Penicillins, and Tetracyclines & related   Review of Systems Review of Systems  Constitutional: Negative.   Negative for activity change.  Respiratory: Negative.    Cardiovascular: Negative.   Gastrointestinal: Negative.   Genitourinary: Negative.   Musculoskeletal:        Rt side muscle pain to mid back,   Skin: Negative.   Neurological: Negative.     Physical Exam Triage Vital Signs ED Triage Vitals  Enc Vitals Group     BP 09/28/20 1242 126/84     Pulse Rate 09/28/20 1242 75     Resp 09/28/20 1242 18     Temp 09/28/20 1242 98.2 F (36.8 C)     Temp Source 09/28/20 1242 Oral     SpO2 09/28/20 1242 98 %     Weight --      Height --      Head Circumference --      Peak Flow --      Pain Score 09/28/20 1240 7     Pain Loc --      Pain Edu? --      Excl. in [place]? --    No data found.  Updated Vital Signs BP 126/84 (BP Location: Right Arm)   Pulse 75   Temp 98.2 F (36.8 C) (Oral)   Resp 18   LMP 04/26/2014 (LMP Unknown)   SpO2 98%   Visual Acuity     Physical Exam Constitutional:      Appearance: Normal appearance.  Cardiovascular:     Rate and Rhythm: Normal rate.  Pulmonary:     Effort: Pulmonary effort is normal.  Abdominal:     General: Abdomen is flat.  Musculoskeletal:        General: Tenderness present. No swelling or signs of injury.     Cervical back: Normal range of motion.  Skin:    General: Skin is warm.     Findings: No erythema or rash.  Neurological:     Mental Status: She is alert.     UC Treatments / Results  Labs (all labs ordered are listed, but only abnormal results are displayed) Labs Reviewed - No data to display  EKG   Radiology No results found.  Procedures Procedures (including critical care time)  Medications Ordered in UC Medications - No data to display  Initial Impression / Assessment and Plan / UC Course  I have reviewed the triage vital signs and the nursing notes.  Pertinent labs & imaging results that were available during my care of the patient were reviewed by me and considered in my medical decision making  (see chart for details).     Warm compresses  Take  meds as needed Take nsaids for pain   Final Clinical Impressions(s) / UC Diagnoses   Final diagnoses:  Muscle strain   Discharge Instructions   None    ED Prescriptions     Medication Sig Dispense Auth. Provider   methocarbamol (ROBAXIN) 500 MG tablet Take 1 tablet (500 mg total) by mouth 2 (two) times daily. 20 tablet Marney Setting, NP      PDMP not reviewed this encounter.   Marney Setting, NP 09/28/20 1313

## 2020-10-29 ENCOUNTER — Other Ambulatory Visit: Payer: Self-pay

## 2020-10-29 ENCOUNTER — Ambulatory Visit: Payer: Managed Care, Other (non HMO) | Admitting: Pulmonary Disease

## 2020-10-29 ENCOUNTER — Encounter: Payer: Self-pay | Admitting: Pulmonary Disease

## 2020-10-29 VITALS — BP 120/74 | HR 75 | Temp 97.0°F | Ht 66.0 in | Wt 194.8 lb

## 2020-10-29 DIAGNOSIS — Z9989 Dependence on other enabling machines and devices: Secondary | ICD-10-CM

## 2020-10-29 DIAGNOSIS — G4733 Obstructive sleep apnea (adult) (pediatric): Secondary | ICD-10-CM | POA: Diagnosis not present

## 2020-10-29 NOTE — Progress Notes (Signed)
Sandy Green    EY:[id number]    12-09-1960  Primary Care Physician:Lowne Cheri Rous Alferd Apa, DO  Referring Physician: Carollee Herter, Alferd Apa, DO [address] [place],  [place] [address]  Chief complaint:   Patient with mild obstructive sleep apnea  HPI: Has been using CPAP for a few months now Feels she is tolerating CPAP well  Sleep quality is about the same  Denies significant daytime fatigue  Diagnosed with mild obstructive sleep apnea  Usually goes to bed at about 1 AM Wake up time about 9-10 AM Usually wakes up feeling like is that a decent rest  She is really concerned about health problems  Does have a history of hypertension, asthma, hypercholesterolemia  Outpatient Encounter Medications as of 10/29/2020  Medication Sig   atorvastatin (LIPITOR) 40 MG tablet TAKE 1 TABLET DAILY   azelastine (ASTELIN) 0.1 % nasal spray    beclomethasone (QVAR REDIHALER) 80 MCG/ACT inhaler USE 2 INHALATIONS TWICE A DAY   carvedilol (COREG) 25 MG tablet Take 1 tablet (25 mg total) by mouth 2 (two) times daily with a meal.   escitalopram (LEXAPRO) 10 MG tablet TAKE 1 TABLET DAILY (DUE FOR FOLLOW UP WITH PRIMARY CARE PHYSICIAN)   fluticasone (FLONASE) 50 MCG/ACT nasal spray Place into both nostrils daily.   irbesartan (AVAPRO) 150 MG tablet Take 1 tablet (150 mg total) by mouth daily.   Levocetirizine Dihydrochloride (XYZAL ALLERGY 24HR PO) Take 1 tablet by mouth daily.   montelukast (SINGULAIR) 10 MG tablet TAKE 1 TABLET BY MOUTH EVERY DAY   [DISCONTINUED] albuterol (PROVENTIL HFA;VENTOLIN HFA) 108 (90 BASE) MCG/ACT inhaler Inhale 1-2 puffs into the lungs every 6 (six) hours as needed for wheezing or shortness of breath.   [DISCONTINUED] cloNIDine (CATAPRES) 0.1 MG tablet TAKE 1 TABLET (0.1 MG TOTAL) BY MOUTH 2 (TWO) TIMES DAILY.   [DISCONTINUED] hydrochlorothiazide (HYDRODIURIL) 25 MG tablet Take 1 tablet (25 mg total) by mouth daily.   [DISCONTINUED] methocarbamol  (ROBAXIN) 500 MG tablet Take 1 tablet (500 mg total) by mouth 2 (two) times daily.   [DISCONTINUED] norethindrone-ethinyl estradiol-iron (JUNEL FE 1.5/30) 1.5-30 MG-MCG tablet Take 1 tablet by mouth daily.   Facility-Administered Encounter Medications as of 10/29/2020  Medication   0.9 %  sodium chloride infusion    Allergies as of 10/29/2020 - Review Complete 10/29/2020  Allergen Reaction Noted   Naproxen Other (See Comments) 07/11/2017   Amlodipine Other (See Comments) 07/11/2017   Penicillins Rash and Other (See Comments) 02/18/2013   Tetracyclines & related Nausea Only and Rash 02/27/2013    Past Medical History:  Diagnosis Date   Allergy    Ankle fracture, left    Anxiety    Asthma    Chicken pox    Elbow fracture    Heart murmur    Hyperlipemia    Hypertension     Past Surgical History:  Procedure Laterality Date   ELBOW FRACTURE SURGERY Left    WISDOM TOOTH EXTRACTION      Family History  Problem Relation Age of Onset   Hypertension Mother    Hypertension Father    Heart disease Father        cabg.6   Diabetes Maternal Grandmother    Cancer Maternal Grandmother        Unsure of kind? Breast   Colon cancer Neg Hx    Esophageal cancer Neg Hx    Rectal cancer Neg Hx    Stomach  cancer Neg Hx     Social History   Socioeconomic History   Marital status: Married    Spouse name: Not on file   Number of children: Not on file   Years of education: Not on file   Highest education level: Not on file  Occupational History   Not on file  Tobacco Use   Smoking status: Never   Smokeless tobacco: Never  Substance and Sexual Activity   Alcohol use: Yes    Alcohol/week: 0.0 standard drinks    Comment: Rare   Drug use: No   Sexual activity: Yes    Partners: Male  Other Topics Concern   Not on file  Social History Narrative   Exercise-- no    Married,lives with husband.   Unemployed.   Social Determinants of Health   Financial Resource Strain: Not on  file  Food Insecurity: Not on file  Transportation Needs: Not on file  Physical Activity: Not on file  Stress: Not on file  Social Connections: Not on file  Intimate Partner Violence: Not on file    Review of Systems  Constitutional:  Negative for fatigue.  Respiratory:  Positive for apnea. Negative for shortness of breath.   Psychiatric/Behavioral:  Positive for sleep disturbance.    Vitals:   10/29/20 1014  BP: 120/74  Pulse: 75  Temp: (!) 97 F (36.1 C)  SpO2: 97%     Physical Exam Constitutional:      Appearance: She is obese.  HENT:     Head: Normocephalic.     Mouth/Throat:     Mouth: Mucous membranes are moist.  Cardiovascular:     Rate and Rhythm: Normal rate and regular rhythm.     Heart sounds: No murmur heard.   No friction rub.  Pulmonary:     Effort: No respiratory distress.     Breath sounds: No stridor. No wheezing or rhonchi.  Musculoskeletal:     Cervical back: No rigidity or tenderness.  Neurological:     Mental Status: She is alert.  Psychiatric:        Mood and Affect: Mood normal.   Data Reviewed: Sleep study reviewed with the patient showing an AHI of 12.6, mild oxygen desaturations  Compliance data shows 100% compliance with CPAP Set between 5 and 15 95 percentile pressure of 7.8 Residual AHI of 0.6  Assessment:  Mild obstructive sleep apnea with daytime sleepiness-daytime sleepiness is better  Compliant with CPAP use  Obesity   Plan/Recommendations: Encouraged to continue CPAP use on a nightly basis I will see her back in about 6 months from now  Encouraged to call with any significant concerns    Sherrilyn Rist MD [facility] 10/29/2020, 10:24 AM  CC: Ann Held, *

## 2020-10-29 NOTE — Patient Instructions (Signed)
Download from his CPAP seems to show that it is treating sleep apnea well  Continue using CPAP on a nightly basis  Call with significant concerns  I will see you 6 months from here

## 2020-11-16 ENCOUNTER — Other Ambulatory Visit: Payer: Self-pay

## 2020-11-17 ENCOUNTER — Encounter: Payer: Self-pay | Admitting: Family Medicine

## 2020-11-17 ENCOUNTER — Other Ambulatory Visit (HOSPITAL_COMMUNITY)
Admission: RE | Admit: 2020-11-17 | Discharge: 2020-11-17 | Disposition: A | Discharge status: Home / Self Care | Payer: Managed Care, Other (non HMO) | Source: Ambulatory Visit | Attending: Family Medicine | Admitting: Family Medicine

## 2020-11-17 ENCOUNTER — Ambulatory Visit (INDEPENDENT_AMBULATORY_CARE_PROVIDER_SITE_OTHER): Payer: Managed Care, Other (non HMO) | Admitting: Family Medicine

## 2020-11-17 VITALS — BP 114/84 | HR 71 | Temp 98.3°F | Resp 18 | Ht 66.0 in | Wt 194.0 lb

## 2020-11-17 DIAGNOSIS — F419 Anxiety disorder, unspecified: Secondary | ICD-10-CM | POA: Diagnosis not present

## 2020-11-17 DIAGNOSIS — I10 Essential (primary) hypertension: Secondary | ICD-10-CM

## 2020-11-17 DIAGNOSIS — Z23 Encounter for immunization: Secondary | ICD-10-CM

## 2020-11-17 DIAGNOSIS — Z0000 Encounter for general adult medical examination without abnormal findings: Secondary | ICD-10-CM | POA: Diagnosis not present

## 2020-11-17 DIAGNOSIS — Z1231 Encounter for screening mammogram for malignant neoplasm of breast: Secondary | ICD-10-CM

## 2020-11-17 DIAGNOSIS — J4520 Mild intermittent asthma, uncomplicated: Secondary | ICD-10-CM | POA: Diagnosis not present

## 2020-11-17 DIAGNOSIS — E785 Hyperlipidemia, unspecified: Secondary | ICD-10-CM

## 2020-11-17 MED ORDER — CARVEDILOL 25 MG PO TABS: 25.0000 mg | ORAL_TABLET | Freq: Two times a day (BID) | ORAL | 3 refills | Status: DC

## 2020-11-17 MED ORDER — AZELASTINE HCL 0.1 % NA SOLN: 1.0000 | Freq: Two times a day (BID) | NASAL | 3 refills | Status: DC

## 2020-11-17 MED ORDER — ESCITALOPRAM OXALATE 10 MG PO TABS: ORAL_TABLET | ORAL | 3 refills | Status: DC

## 2020-11-17 MED ORDER — QVAR REDIHALER 80 MCG/ACT IN AERB: INHALATION_SPRAY | RESPIRATORY_TRACT | 3 refills | Status: DC

## 2020-11-17 MED ORDER — MONTELUKAST SODIUM 10 MG PO TABS: ORAL_TABLET | ORAL | 3 refills | Status: DC

## 2020-11-17 MED ORDER — IRBESARTAN 150 MG PO TABS: 150.0000 mg | ORAL_TABLET | Freq: Every day | ORAL | 3 refills | Status: DC

## 2020-11-17 MED ORDER — ATORVASTATIN CALCIUM 40 MG PO TABS: 40.0000 mg | ORAL_TABLET | Freq: Every day | ORAL | 3 refills | Status: DC

## 2020-11-17 NOTE — Patient Instructions (Signed)
Preventive Care 58-58 Years Old, Female Preventive care refers to lifestyle choices and visits with your health care provider that can promote health and wellness. This includes: A yearly physical exam. This is also called an annual wellness visit. Regular dental and eye exams. Immunizations. Screening for certain conditions. Healthy lifestyle choices, such as: Eating a healthy diet. Getting regular exercise. Not using drugs or products that contain nicotine and tobacco. Limiting alcohol use. What can I expect for my preventive care visit? Physical exam Your health care provider will check your: Height and weight. These may be used to calculate your BMI (body mass index). BMI is a measurement that tells if you are at a healthy weight. Heart rate and blood pressure. Body temperature. Skin for abnormal spots. Counseling Your health care provider may ask you questions about your: Past medical problems. Family's medical history. Alcohol, tobacco, and drug use. Emotional well-being. Home life and relationship well-being. Sexual activity. Diet, exercise, and sleep habits. Work and work Statistician. Access to firearms. Method of birth control. Menstrual cycle. Pregnancy history. What immunizations do I need?  Vaccines are usually given at various ages, according to a schedule. Your health care provider will recommend vaccines for you based on your age, medicalhistory, and lifestyle or other factors, such as travel or where you work. What tests do I need? Blood tests Lipid and cholesterol levels. These may be checked every 5 years, or more often if you are over 37 years old. Hepatitis C test. Hepatitis B test. Screening Lung cancer screening. You may have this screening every year starting at age 30 if you have a 30-pack-year history of smoking and currently smoke or have quit within the past 15 years. Colorectal cancer screening. All adults should have this screening starting at  age 23 and continuing until age 3. Your health care provider may recommend screening at age 88 if you are at increased risk. You will have tests every 1-10 years, depending on your results and the type of screening test. Diabetes screening. This is done by checking your blood sugar (glucose) after you have not eaten for a while (fasting). You may have this done every 1-3 years. Mammogram. This may be done every 1-2 years. Talk with your health care provider about when you should start having regular mammograms. This may depend on whether you have a family history of breast cancer. BRCA-related cancer screening. This may be done if you have a family history of breast, ovarian, tubal, or peritoneal cancers. Pelvic exam and Pap test. This may be done every 3 years starting at age 79. Starting at age 54, this may be done every 5 years if you have a Pap test in combination with an HPV test. Other tests STD (sexually transmitted disease) testing, if you are at risk. Bone density scan. This is done to screen for osteoporosis. You may have this scan if you are at high risk for osteoporosis. Talk with your health care provider about your test results, treatment options,and if necessary, the need for more tests. Follow these instructions at home: Eating and drinking  Eat a diet that includes fresh fruits and vegetables, whole grains, lean protein, and low-fat dairy products. Take vitamin and mineral supplements as recommended by your health care provider. Do not drink alcohol if: Your health care provider tells you not to drink. You are pregnant, may be pregnant, or are planning to become pregnant. If you drink alcohol: Limit how much you have to 0-1 drink a day. Be aware  of how much alcohol is in your drink. In the [place] one drink equals one 12 oz bottle of beer (355 mL), one 5 oz glass of wine (148 mL), or one 1 oz glass of hard liquor (44 mL).  Lifestyle Take daily care of your teeth and  gums. Brush your teeth every morning and night with fluoride toothpaste. Floss one time each day. Stay active. Exercise for at least 30 minutes 5 or more days each week. Do not use any products that contain nicotine or tobacco, such as cigarettes, e-cigarettes, and chewing tobacco. If you need help quitting, ask your health care provider. Do not use drugs. If you are sexually active, practice safe sex. Use a condom or other form of protection to prevent STIs (sexually transmitted infections). If you do not wish to become pregnant, use a form of birth control. If you plan to become pregnant, see your health care provider for a prepregnancy visit. If told by your health care provider, take low-dose aspirin daily starting at age 29. Find healthy ways to cope with stress, such as: Meditation, yoga, or listening to music. Journaling. Talking to a trusted person. Spending time with friends and family. Safety Always wear your seat belt while driving or riding in a vehicle. Do not drive: If you have been drinking alcohol. Do not ride with someone who has been drinking. When you are tired or distracted. While texting. Wear a helmet and other protective equipment during sports activities. If you have firearms in your house, make sure you follow all gun safety procedures. What's next? Visit your health care provider once a year for an annual wellness visit. Ask your health care provider how often you should have your eyes and teeth checked. Stay up to date on all vaccines. This information is not intended to replace advice given to you by your health care provider. Make sure you discuss any questions you have with your healthcare provider. Document Revised: 12/24/2019 Document Reviewed: 11/30/2017 Elsevier Patient Education  2022 [employer].

## 2020-11-17 NOTE — Assessment & Plan Note (Signed)
Stable Refill inhalers Pneum 20 given

## 2020-11-17 NOTE — Assessment & Plan Note (Signed)
ghm utd Check labs See AVS 

## 2020-11-17 NOTE — Assessment & Plan Note (Signed)
Encourage heart healthy diet such as MIND or DASH diet, increase exercise, avoid trans fats, simple carbohydrates and processed foods, consider a krill or fish or flaxseed oil cap daily.  °

## 2020-11-17 NOTE — Assessment & Plan Note (Signed)
Well controlled, no changes to meds. Encouraged heart healthy diet such as the DASH diet and exercise as tolerated.  °

## 2020-11-17 NOTE — Progress Notes (Signed)
Subjective:   By signing my name below, I, Shehryar Baig, attest that this documentation has been prepared under the direction and in the presence of Dr. [name], DO. 11/17/2020    Patient ID: Sandy Green, female    DOB: Jul 26, 1960, 59 y.o.   MRN: [id number]  Chief Complaint  Patient presents with   Annual Exam    Pt states not fasting. Pt needs pap     HPI Patient is in today for a comprehensive physical exam.  She is requesting a refill for 10 mg Lexapro daily PO, 80 mcg/act Qvar inhaler, 10 mg Singulair daily PO, 25 mg carvedilol 2x daily PO, 150 mg Avapro daily PO, 40 mg Lipitor daily PO, and 0.1% Astelin nasal spray.  She denies having any fever, ear pain, congestion, sinus pain, sore throat, eye pain, chest pain, palpations, cough, SOB, wheezing, n/v/d, constipation, blood in stool, dysuria, frequency, hematuria, or headaches at this time. She has no recent changes to her family medical history. She has no recent changes to her surgical history. She does not smoke tobacco products. She does not use drugs. She does not drink alcohol. She is sexually active with one female partner.  She does not participate in regular exercise at this time.  She is not interested in getting HIV screening at this time. She is UTD on shingles vaccines. She has 4 Covid-19 vaccines at this time. She is eligible for the pneumonia vaccine and is interested in getting it.  She is UTD on vision care.   Past Medical History:  Diagnosis Date   Allergy    Ankle fracture, left    Anxiety    Asthma    Chicken pox    Elbow fracture    Heart murmur    Hyperlipemia    Hypertension    Past Surgical History:  Procedure Laterality Date   ELBOW FRACTURE SURGERY Left    WISDOM TOOTH EXTRACTION      Family History  Problem Relation Age of Onset   Hypertension Mother    Hypertension Father    Heart disease Father        cabg.6   Diabetes Maternal Grandmother    Cancer Maternal Grandmother         Unsure of kind? Breast   Colon cancer Neg Hx    Esophageal cancer Neg Hx    Rectal cancer Neg Hx    Stomach cancer Neg Hx     Social History   Socioeconomic History   Marital status: Married    Spouse name: Not on file   Number of children: Not on file   Years of education: Not on file   Highest education level: Not on file  Occupational History   Not on file  Tobacco Use   Smoking status: Never   Smokeless tobacco: Never  Substance and Sexual Activity   Alcohol use: Yes    Alcohol/week: 0.0 standard drinks    Comment: Rare   Drug use: No   Sexual activity: Yes    Partners: Male  Other Topics Concern   Not on file  Social History Narrative   Exercise-- no    Married,lives with husband.   Unemployed.   Social Determinants of Health   Financial Resource Strain: Not on file  Food Insecurity: Not on file  Transportation Needs: Not on file  Physical Activity: Not on file  Stress: Not on file  Social Connections: Not on file  Intimate Partner Violence: Not on file  Outpatient Medications Prior to Visit  Medication Sig Dispense Refill   fluticasone (FLONASE) 50 MCG/ACT nasal spray Place into both nostrils daily.     Levocetirizine Dihydrochloride (XYZAL ALLERGY 24HR PO) Take 1 tablet by mouth daily.     atorvastatin (LIPITOR) 40 MG tablet TAKE 1 TABLET DAILY 90 tablet 3   azelastine (ASTELIN) 0.1 % nasal spray      beclomethasone (QVAR REDIHALER) 80 MCG/ACT inhaler USE 2 INHALATIONS TWICE A DAY 31.8 g 3   carvedilol (COREG) 25 MG tablet Take 1 tablet (25 mg total) by mouth 2 (two) times daily with a meal. 180 tablet 1   escitalopram (LEXAPRO) 10 MG tablet TAKE 1 TABLET DAILY (DUE FOR FOLLOW UP WITH PRIMARY CARE PHYSICIAN) 90 tablet 0   irbesartan (AVAPRO) 150 MG tablet Take 1 tablet (150 mg total) by mouth daily. 30 tablet 0   montelukast (SINGULAIR) 10 MG tablet TAKE 1 TABLET BY MOUTH EVERY DAY 90 tablet 1   Facility-Administered Medications Prior to Visit   Medication Dose Route Frequency Provider Last Rate Last Admin   0.9 %  sodium chloride infusion  500 mL Intravenous Continuous Armbruster, Carlota Raspberry, MD        Allergies  Allergen Reactions   Naproxen Other (See Comments)    "tired and dizziness"   Amlodipine Other (See Comments)    "dizziness"   Penicillins Rash and Other (See Comments)    Reaction unknown   Tetracyclines & Related Nausea Only and Rash    GI upset    Review of Systems  Constitutional:  Negative for fever.  HENT:  Negative for congestion, ear pain, sinus pain and sore throat.   Eyes:  Negative for pain.  Respiratory:  Negative for cough, shortness of breath and wheezing.   Cardiovascular:  Negative for chest pain and palpitations.  Gastrointestinal:  Negative for blood in stool, constipation, diarrhea, nausea and vomiting.  Genitourinary:  Negative for dysuria, frequency and hematuria.  Neurological:  Negative for headaches.  Psychiatric/Behavioral:  Negative for depression. The patient is not nervous/anxious.       Objective:    Physical Exam Constitutional:      General: She is not in acute distress.    Appearance: Normal appearance. She is not ill-appearing.  HENT:     Head: Normocephalic and atraumatic.     Right Ear: Tympanic membrane, ear canal and external ear normal.     Left Ear: Tympanic membrane, ear canal and external ear normal.  Eyes:     Extraocular Movements: Extraocular movements intact.     Pupils: Pupils are equal, round, and reactive to light.  Cardiovascular:     Rate and Rhythm: Normal rate and regular rhythm.     Heart sounds: Normal heart sounds. No murmur heard.   No gallop.  Pulmonary:     Effort: Pulmonary effort is normal. No respiratory distress.     Breath sounds: Normal breath sounds. No wheezing or rales.  Abdominal:     General: Bowel sounds are normal. There is no distension.     Palpations: Abdomen is soft. There is no mass.     Tenderness: There is no abdominal  tenderness. There is no guarding or rebound.  Genitourinary:    General: Normal vulva.     Labia:        Right: No rash.        Left: No rash.      Vagina: Normal.     Cervix: Normal.  Rectum: Guaiac result negative.  Skin:    General: Skin is warm and dry.  Neurological:     Mental Status: She is alert and oriented to person, place, and time.  Psychiatric:        Behavior: Behavior normal.        Judgment: Judgment normal.    BP 114/84 (BP Location: Right Arm, Patient Position: Sitting, Cuff Size: Normal)   Pulse 71   Temp 98.3 F (36.8 C) (Oral)   Resp 18   Ht '5\' 6"'$  (1.676 m)   Wt 194 lb (88 kg)   LMP 04/26/2014 (LMP Unknown)   SpO2 97%   BMI 31.31 kg/m  Wt Readings from Last 3 Encounters:  11/17/20 194 lb (88 kg)  10/29/20 194 lb 12.8 oz (88.4 kg)  05/04/20 190 lb (86.2 kg)    Diabetic Foot Exam - Simple   No data filed    Lab Results  Component Value Date   WBC 9.7 06/24/2019   HGB 13.5 06/24/2019   HCT 40.9 06/24/2019   PLT 297.0 06/24/2019   GLUCOSE 86 09/24/2019   CHOL 151 09/24/2019   TRIG 63.0 09/24/2019   HDL 40.40 09/24/2019   LDLCALC 98 09/24/2019   ALT 15 09/24/2019   AST 15 09/24/2019   NA 141 09/24/2019   K 4.3 09/24/2019   CL 107 09/24/2019   CREATININE 1.07 09/24/2019   BUN 16 09/24/2019   CO2 28 09/24/2019   TSH 0.89 11/23/2017    Lab Results  Component Value Date   TSH 0.89 11/23/2017   Lab Results  Component Value Date   WBC 9.7 06/24/2019   HGB 13.5 06/24/2019   HCT 40.9 06/24/2019   MCV 92.5 06/24/2019   PLT 297.0 06/24/2019   Lab Results  Component Value Date   NA 141 09/24/2019   K 4.3 09/24/2019   CO2 28 09/24/2019   GLUCOSE 86 09/24/2019   BUN 16 09/24/2019   CREATININE 1.07 09/24/2019   BILITOT 1.0 09/24/2019   ALKPHOS 62 09/24/2019   AST 15 09/24/2019   ALT 15 09/24/2019   PROT 6.2 09/24/2019   ALBUMIN 3.4 (L) 09/24/2019   CALCIUM 8.5 09/24/2019   GFR 52.79 (L) 09/24/2019   Lab Results  Component  Value Date   CHOL 151 09/24/2019   Lab Results  Component Value Date   HDL 40.40 09/24/2019   Lab Results  Component Value Date   LDLCALC 98 09/24/2019   Lab Results  Component Value Date   TRIG 63.0 09/24/2019   Lab Results  Component Value Date   CHOLHDL 4 09/24/2019   No results found for: HGBA1C  Mammogram- Last completed 05/28/2015. Results normal. Repeat in 1 year.  Pap Smear- Last completed 03/23/2015. Results normal. Repeat in 3 years.  Colonoscopy- Last completed 07/18/2016. Results showed one 5 mm polyp in sigmoid colon which was removed, diverticulosis in the entire examined colon, internal hemorrhoids, otherwise results are normal.      Assessment & Plan:   Problem List Items Addressed This Visit       Unprioritized   Anxiety   Relevant Medications   escitalopram (LEXAPRO) 10 MG tablet   Asthma, mild intermittent    Stable Refill inhalers Pneum 20 given       Relevant Medications   azelastine (ASTELIN) 0.1 % nasal spray   beclomethasone (QVAR REDIHALER) 80 MCG/ACT inhaler   montelukast (SINGULAIR) 10 MG tablet   Essential hypertension    Well controlled, no changes to  meds. Encouraged heart healthy diet such as the DASH diet and exercise as tolerated.       Relevant Medications   atorvastatin (LIPITOR) 40 MG tablet   carvedilol (COREG) 25 MG tablet   irbesartan (AVAPRO) 150 MG tablet   Other Relevant Orders   Lipid panel   CBC with Differential/Platelet   TSH   Comprehensive metabolic panel   Hyperlipidemia    Encourage heart healthy diet such as MIND or DASH diet, increase exercise, avoid trans fats, simple carbohydrates and processed foods, consider a krill or fish or flaxseed oil cap daily.       Relevant Medications   atorvastatin (LIPITOR) 40 MG tablet   carvedilol (COREG) 25 MG tablet   irbesartan (AVAPRO) 150 MG tablet   Other Relevant Orders   Lipid panel   CBC with Differential/Platelet   TSH   Comprehensive metabolic panel    Preventative health care - Primary    ghm utd Check labs  See AVS       Relevant Orders   Cytology - PAP( [place])   Lipid panel   CBC with Differential/Platelet   TSH   Comprehensive metabolic panel   Other Visit Diagnoses     Encounter for screening mammogram for malignant neoplasm of breast       Relevant Orders   MM DIGITAL SCREENING BILATERAL   Need for pneumococcal vaccination       Relevant Orders   Pneumococcal conjugate vaccine 20-valent (Prevnar 20) (Completed)        Meds ordered this encounter  Medications   atorvastatin (LIPITOR) 40 MG tablet    Sig: Take 1 tablet (40 mg total) by mouth daily.    Dispense:  90 tablet    Refill:  3   azelastine (ASTELIN) 0.1 % nasal spray    Sig: Place 1 spray into both nostrils 2 (two) times daily.    Dispense:  90 mL    Refill:  3   beclomethasone (QVAR REDIHALER) 80 MCG/ACT inhaler    Sig: USE 2 INHALATIONS TWICE A DAY    Dispense:  3 each    Refill:  3   carvedilol (COREG) 25 MG tablet    Sig: Take 1 tablet (25 mg total) by mouth 2 (two) times daily with a meal.    Dispense:  180 tablet    Refill:  3   escitalopram (LEXAPRO) 10 MG tablet    Sig: TAKE 1 TABLET DAILY (DUE FOR FOLLOW UP WITH PRIMARY CARE PHYSICIAN)    Dispense:  90 tablet    Refill:  3   irbesartan (AVAPRO) 150 MG tablet    Sig: Take 1 tablet (150 mg total) by mouth daily.    Dispense:  90 tablet    Refill:  3    Requested drug refills are authorized, however, the patient needs further evaluation and/or laboratory testing before further refills are given. Ask her to make an appointment for this.   montelukast (SINGULAIR) 10 MG tablet    Sig: TAKE 1 TABLET BY MOUTH EVERY DAY    Dispense:  90 tablet    Refill:  3    I, Dr. [name], DO, personally preformed the services described in this documentation.  All medical record entries made by the scribe were at my direction and in my presence.  I have reviewed the chart and discharge  instructions (if applicable) and agree that the record reflects my personal performance and is accurate and complete. 11/17/2020  I,Shehryar Baig,acting as a Education administrator for [employer], DO.,have documented all relevant documentation on the behalf of Sandy Held, DO,as directed by  Sandy Held, DO while in the presence of Sandy Held, DO.   Sandy Held, DO

## 2020-11-18 LAB — COMPREHENSIVE METABOLIC PANEL
ALT: 20 U/L (ref 0–35)
AST: 22 U/L (ref 0–37)
Albumin: 3.5 g/dL (ref 3.5–5.2)
Alkaline Phosphatase: 103 U/L (ref 39–117)
BUN: 17 mg/dL (ref 6–23)
CO2: 28 mEq/L (ref 19–32)
Calcium: 9.2 mg/dL (ref 8.4–10.5)
Chloride: 105 mEq/L (ref 96–112)
Creatinine, Ser: 1.08 mg/dL (ref 0.40–1.20)
GFR: 56.63 mL/min — ABNORMAL LOW (ref >60.00)
Glucose, Bld: 87 mg/dL (ref 70–99)
Potassium: 4.2 mEq/L (ref 3.5–5.1)
Sodium: 141 mEq/L (ref 135–145)
Total Bilirubin: 1.2 mg/dL (ref 0.2–1.2)
Total Protein: 6.4 g/dL (ref 6.0–8.3)

## 2020-11-18 LAB — CBC WITH DIFFERENTIAL/PLATELET
Basophils Absolute: 0.1 10*3/uL (ref 0.0–0.1)
Basophils Relative: 1 % (ref 0.0–3.0)
Eosinophils Absolute: 0.1 10*3/uL (ref 0.0–0.7)
Eosinophils Relative: 2.3 % (ref 0.0–5.0)
HCT: 40.4 % (ref 36.0–46.0)
Hemoglobin: 13.1 g/dL (ref 12.0–15.0)
Lymphocytes Relative: 31.3 % (ref 12.0–46.0)
Lymphs Abs: 1.9 10*3/uL (ref 0.7–4.0)
MCHC: 32.4 g/dL (ref 30.0–36.0)
MCV: 92.4 fl (ref 78.0–100.0)
Monocytes Absolute: 0.6 10*3/uL (ref 0.1–1.0)
Monocytes Relative: 10.5 % (ref 3.0–12.0)
Neutro Abs: 3.4 10*3/uL (ref 1.4–7.7)
Neutrophils Relative %: 54.9 % (ref 43.0–77.0)
Platelets: 204 10*3/uL (ref 150.0–400.0)
RBC: 4.37 Mil/uL (ref 3.87–5.11)
RDW: 14.3 % (ref 11.5–15.5)
WBC: 6.1 10*3/uL (ref 4.0–10.5)

## 2020-11-18 LAB — LIPID PANEL
Cholesterol: 148 mg/dL (ref 0–200)
HDL: 63.2 mg/dL (ref >39.00)
LDL Cholesterol: 70 mg/dL (ref 0–99)
NonHDL: 84.39
Total CHOL/HDL Ratio: 2
Triglycerides: 72 mg/dL (ref 0.0–149.0)
VLDL: 14.4 mg/dL (ref 0.0–40.0)

## 2020-11-18 LAB — TSH: TSH: 1.28 u[IU]/mL (ref 0.35–5.50)

## 2020-11-20 LAB — CYTOLOGY - PAP: Adequacy: ABNORMAL

## 2020-12-29 ENCOUNTER — Emergency Department (HOSPITAL_BASED_OUTPATIENT_CLINIC_OR_DEPARTMENT_OTHER): Payer: Managed Care, Other (non HMO)

## 2020-12-29 ENCOUNTER — Other Ambulatory Visit: Payer: Self-pay

## 2020-12-29 ENCOUNTER — Ambulatory Visit (HOSPITAL_COMMUNITY)
Admission: EM | Admit: 2020-12-29 | Discharge: 2020-12-29 | Disposition: A | Discharge status: Home / Self Care | Payer: Managed Care, Other (non HMO)

## 2020-12-29 ENCOUNTER — Emergency Department (HOSPITAL_COMMUNITY)
Admission: EM | Admit: 2020-12-29 | Discharge: 2020-12-29 | Disposition: A | Discharge status: Home / Self Care | Payer: Managed Care, Other (non HMO) | Attending: Emergency Medicine | Admitting: Emergency Medicine

## 2020-12-29 ENCOUNTER — Encounter (HOSPITAL_COMMUNITY): Payer: Self-pay | Admitting: Emergency Medicine

## 2020-12-29 DIAGNOSIS — J4520 Mild intermittent asthma, uncomplicated: Secondary | ICD-10-CM | POA: Insufficient documentation

## 2020-12-29 DIAGNOSIS — M7122 Synovial cyst of popliteal space [Baker], left knee: Secondary | ICD-10-CM | POA: Insufficient documentation

## 2020-12-29 DIAGNOSIS — M7989 Other specified soft tissue disorders: Secondary | ICD-10-CM | POA: Diagnosis not present

## 2020-12-29 DIAGNOSIS — S86819A Strain of other muscle(s) and tendon(s) at lower leg level, unspecified leg, initial encounter: Secondary | ICD-10-CM

## 2020-12-29 DIAGNOSIS — M79605 Pain in left leg: Secondary | ICD-10-CM | POA: Diagnosis present

## 2020-12-29 DIAGNOSIS — Z79899 Other long term (current) drug therapy: Secondary | ICD-10-CM | POA: Diagnosis not present

## 2020-12-29 DIAGNOSIS — Z7952 Long term (current) use of systemic steroids: Secondary | ICD-10-CM | POA: Insufficient documentation

## 2020-12-29 DIAGNOSIS — M25562 Pain in left knee: Secondary | ICD-10-CM

## 2020-12-29 DIAGNOSIS — S86812A Strain of other muscle(s) and tendon(s) at lower leg level, left leg, initial encounter: Secondary | ICD-10-CM

## 2020-12-29 LAB — CBC WITH DIFFERENTIAL/PLATELET
Abs Immature Granulocytes: 0.02 10*3/uL (ref 0.00–0.07)
Basophils Absolute: 0.1 10*3/uL (ref 0.0–0.1)
Basophils Relative: 1 %
Eosinophils Absolute: 0.2 10*3/uL (ref 0.0–0.5)
Eosinophils Relative: 2 %
HCT: 39.7 % (ref 36.0–46.0)
Hemoglobin: 12.4 g/dL (ref 12.0–15.0)
Immature Granulocytes: 0 %
Lymphocytes Relative: 23 %
Lymphs Abs: 2.1 10*3/uL (ref 0.7–4.0)
MCH: 29.8 pg (ref 26.0–34.0)
MCHC: 31.2 g/dL (ref 30.0–36.0)
MCV: 95.4 fL (ref 80.0–100.0)
Monocytes Absolute: 0.9 10*3/uL (ref 0.1–1.0)
Monocytes Relative: 10 %
Neutro Abs: 5.8 10*3/uL (ref 1.7–7.7)
Neutrophils Relative %: 64 %
Platelets: 238 10*3/uL (ref 150–400)
RBC: 4.16 MIL/uL (ref 3.87–5.11)
RDW: 13.5 % (ref 11.5–15.5)
WBC: 9.1 10*3/uL (ref 4.0–10.5)
nRBC: 0 % (ref 0.0–0.2)

## 2020-12-29 LAB — BASIC METABOLIC PANEL
Anion gap: 5 (ref 5–15)
BUN: 16 mg/dL (ref 6–20)
CO2: 27 mmol/L (ref 22–32)
Calcium: 9 mg/dL (ref 8.9–10.3)
Chloride: 109 mmol/L (ref 98–111)
Creatinine, Ser: 0.99 mg/dL (ref 0.44–1.00)
GFR, Estimated: >60 mL/min (ref >60)
Glucose, Bld: 92 mg/dL (ref 70–99)
Potassium: 4.1 mmol/L (ref 3.5–5.1)
Sodium: 141 mmol/L (ref 135–145)

## 2020-12-29 NOTE — ED Provider Notes (Addendum)
Sandy Green    CSN: [id number] Arrival date & time: 12/29/20  1324      History   Chief Complaint Chief Complaint  Patient presents with   Leg Swelling    Left leg     HPI Leanore Biggers is a 60 y.o. female.   60 year old female patient presents to urgent care chief complaint of left lower leg pain after feeling like she pulled her calf muscle 2 days prior.  Patient states her left leg feels swollen.  Patient is able to bear weight, no discoloration no erythema, DP +2 bilaterally  The history is provided by the patient. No language interpreter was used.   Past Medical History:  Diagnosis Date   Allergy    Ankle fracture, left    Anxiety    Asthma    Chicken pox    Elbow fracture    Heart murmur    Hyperlipemia    Hypertension     Patient Active Problem List   Diagnosis Date Noted   Strain of calf muscle 12/29/2020   Preventative health care 11/17/2020   Amenorrhea 09/24/2019   Anxiety 05/28/2018   Left wrist pain 09/21/2015   Pain of left heel 09/21/2015   Vaginitis and vulvovaginitis 03/23/2015   Rash and nonspecific skin eruption 01/01/2015   Essential hypertension 06/16/2014   Conjunctivitis 04/09/2014   Hyperlipidemia 02/28/2013   Multiple allergies 02/28/2013   Asthma, mild intermittent 02/28/2013   Obesity (BMI 30-39.9) 02/27/2013    Past Surgical History:  Procedure Laterality Date   ELBOW FRACTURE SURGERY Left    WISDOM TOOTH EXTRACTION      OB History   No obstetric history on file.      Home Medications    Prior to Admission medications   Medication Sig Start Date End Date Taking? Authorizing Provider  atorvastatin (LIPITOR) 40 MG tablet Take 1 tablet (40 mg total) by mouth daily. 11/17/20  Yes [name] R, DO  azelastine (ASTELIN) 0.1 % nasal spray Place 1 spray into both nostrils 2 (two) times daily. 11/17/20  Yes [name] R, DO  beclomethasone (QVAR REDIHALER) 80 MCG/ACT inhaler USE 2 INHALATIONS TWICE A  DAY 11/17/20  Yes [name] R, DO  carvedilol (COREG) 25 MG tablet Take 1 tablet (25 mg total) by mouth 2 (two) times daily with a meal. 11/17/20  Yes Carollee Herter, Yvonne R, DO  escitalopram (LEXAPRO) 10 MG tablet TAKE 1 TABLET DAILY (DUE FOR FOLLOW UP WITH PRIMARY CARE PHYSICIAN) 11/17/20  Yes Carollee Herter, Yvonne R, DO  fluticasone (FLONASE) 50 MCG/ACT nasal spray Place into both nostrils daily.   Yes [provider]  ipratropium (ATROVENT) 0.06 % nasal spray Place into both nostrils. 10/05/20  Yes [provider]  irbesartan (AVAPRO) 150 MG tablet Take 1 tablet (150 mg total) by mouth daily. 11/17/20  Yes Ann Held, DO  Levocetirizine Dihydrochloride (XYZAL ALLERGY 24HR PO) Take 1 tablet by mouth daily.   Yes [provider]  montelukast (SINGULAIR) 10 MG tablet TAKE 1 TABLET BY MOUTH EVERY DAY 11/17/20  Yes [name] R, DO  albuterol (PROVENTIL HFA;VENTOLIN HFA) 108 (90 BASE) MCG/ACT inhaler Inhale 1-2 puffs into the lungs every 6 (six) hours as needed for wheezing or shortness of breath. 05/03/14 09/04/19  Harden Mo, MD  cloNIDine (CATAPRES) 0.1 MG tablet TAKE 1 TABLET (0.1 MG TOTAL) BY MOUTH 2 (TWO) TIMES DAILY. 07/22/19 09/04/19  Ann Held, DO  hydrochlorothiazide (  HYDRODIURIL) 25 MG tablet Take 1 tablet (25 mg total) by mouth daily. 06/14/19 09/04/19  Ann Held, DO    Family History Family History  Problem Relation Age of Onset   Hypertension Mother    Hypertension Father    Heart disease Father        cabg.6   Diabetes Maternal Grandmother    Cancer Maternal Grandmother        Unsure of kind? Breast   Colon cancer Neg Hx    Esophageal cancer Neg Hx    Rectal cancer Neg Hx    Stomach cancer Neg Hx     Social History Social History   Tobacco Use   Smoking status: Never   Smokeless tobacco: Never  Vaping Use   Vaping Use: Never used  Substance Use Topics   Alcohol use: Yes    Alcohol/week: 0.0  standard drinks    Comment: Rare   Drug use: No     Allergies   Naproxen, Amlodipine, Penicillins, and Tetracyclines & related   Review of Systems Review of Systems  Constitutional:  Negative for fever.  Musculoskeletal:  Positive for gait problem and myalgias.  Skin:  Negative for color change.  All other systems reviewed and are negative.   Physical Exam Triage Vital Signs ED Triage Vitals  Enc Vitals Group     BP 12/29/20 0917 136/78     Pulse Rate 12/29/20 0917 72     Resp --      Temp 12/29/20 0917 98.1 F (36.7 C)     Temp Source 12/29/20 0917 Oral     SpO2 12/29/20 0917 97 %     Weight --      Height --      Head Circumference --      Peak Flow --      Pain Score 12/29/20 0919 0     Pain Loc --      Pain Edu? --      Excl. in [place]? --    No data found.  Updated Vital Signs BP 136/78 (BP Location: Left Arm)   Pulse 72   Temp 98.1 F (36.7 C) (Oral)   LMP 04/26/2014 (LMP Unknown)   SpO2 97%   Visual Acuity Right Eye Distance:   Left Eye Distance:   Bilateral Distance:    Right Eye Near:   Left Eye Near:    Bilateral Near:     Physical Exam Vitals and nursing note reviewed.  Constitutional:      Appearance: Normal appearance. She is well-developed and well-groomed.  HENT:     Head: Normocephalic.  Cardiovascular:     Rate and Rhythm: Normal rate.     Pulses: Normal pulses.          Dorsalis pedis pulses are 2+ on the right side and 2+ on the left side.     Comments: No appreciable mass, -Thompson test Skin:    General: Skin is warm.     Capillary Refill: Capillary refill takes less than 2 seconds.     Findings: No erythema.          Comments: Both calves measure 17 inches in circumference, -[place]  Neurological:     General: No focal deficit present.     Mental Status: She is alert and oriented to person, place, and time.     GCS: GCS eye subscore is 4. GCS verbal subscore is 5. GCS motor subscore is 6.     Cranial  Nerves: Cranial nerves  are intact.     Sensory: Sensation is intact.     Motor: Motor function is intact.     Coordination: Coordination is intact.     Gait: Gait is intact.  Psychiatric:        Behavior: Behavior is cooperative.     UC Treatments / Results  Labs (all labs ordered are listed, but only abnormal results are displayed) Labs Reviewed - No data to display  EKG     Procedures Procedures (including critical care time)  Medications Ordered in UC Medications - No data to display  Initial Impression / Assessment and Plan / UC Course  I have reviewed the triage vital signs and the nursing notes.  Pertinent labs & imaging results that were available during my care of the patient were reviewed by me and considered in my medical decision making (see chart for details).     DDX: muscle strain, Achilles rupture, DVT, Baker cyst Final Clinical Impressions(s) / UC Diagnoses   Final diagnoses:  Strain of calf muscle, left, initial encounter     Discharge Instructions      Please follow up with PCP for US doppler outpt to r/o DVT, if PCP unable to schedule please go to Er today for [place]. Most likely you have a muscle strain, rest,ice,elevate left leg. May take OTC tylenol as label directed.      ED Prescriptions   None    PDMP not reviewed this encounter.   Tori Milks, NP 47/42/59 [id number]    Tori Milks, NP 75/64/33 [id number]

## 2020-12-29 NOTE — ED Triage Notes (Signed)
Pt with L leg swelling, first noticed on Sunday. Denies constant pain, although does have some pain with some movement. Possible injury to same while getting out of the car and hearing a "pop" last week. Denies shob, long trips. Sent here by UC for DVT study.

## 2020-12-29 NOTE — ED Provider Notes (Signed)
Emergency Medicine Provider Triage Evaluation Note  Sandy Green , a 60 y.o. female  was evaluated in triage.  Pt complains of left leg swelling.  Patient presents to the emergency department after being seen by urgent care with complaints of left lower leg pain.  She says that her left upper leg has been swollen for couple of days.  She describes not having pain but having generalized discomfort in that leg.  She denies any trauma to the area.  She is able to ambulate easily.  There is no discoloration or erythema of the area.  Urgent care sent her here for evaluation of a DVT.  Of note she does say she could have had an injury last week while getting out of the car and hearing a pop in her left lower leg and over this unsure that this is related.  She denies any hormone use, history of PE or DVT, recent surgery, recent bedrest.  She denies any shortness of breath, chest pain, abdominal pain, nausea, vomiting, fever, chills.   Review of Systems  Positive: Leg swelling Negative: Shortness of breath, chest pain, abdominal pain, nausea, vomiting, chills, fever, leg pain  Physical Exam  BP (!) 140/99   Pulse 66   Temp 98.4 F (36.9 C) (Oral)   Resp 16   LMP 04/26/2014 (LMP Unknown)   SpO2 97%  Gen:   Awake, no distress  Resp:  Normal effort  MSK:   Moves extremities without difficulty  Other:  Difficult to evaluate left upper thigh due to patient's jeans being on.  Appears to be slightly more swollen than the right.  No tenderness to palpation on upper thigh or lower thigh.  Pedal pulses 2+.  Patient able to ambulate with no difficulty.  Medical Decision Making  Medically screening exam initiated at 12:39 PM.  Appropriate orders placed.  Sandy Green was informed that the remainder of the evaluation will be completed by another provider, this initial triage assessment does not replace that evaluation, and the importance of remaining in the ED until their evaluation is complete.   Sheila Oats 12/29/20 1242    Daleen Bo, MD 12/29/20 1640

## 2020-12-29 NOTE — Progress Notes (Signed)
Lower extremity venous LT study completed.  Preliminary results relayed to [place], [place].  See CV Proc for preliminary results report.   Darlin Coco, RDMS, RVT

## 2020-12-29 NOTE — ED Triage Notes (Signed)
Pt c/o left leg swelling x 2 days.

## 2020-12-29 NOTE — Discharge Instructions (Addendum)
Please follow up with PCP for US doppler outpt to r/o DVT, if PCP unable to schedule please go to Er today for [place]. Most likely you have a muscle strain, rest,ice,elevate left leg. May take OTC tylenol as label directed.

## 2020-12-29 NOTE — ED Provider Notes (Signed)
[facility] EMERGENCY DEPARTMENT Provider Note   CSN: [id number] Arrival date & time: 12/29/20  1150     History Chief Complaint  Patient presents with   Leg Swelling    Addisen Chappelle is a 60 y.o. female.  She reports that her leg has been bothering her slightly for about a week.  About a week ago, she also felt some immediate posterior knee pain and a pop while stepping out of a car.  Symptoms have waxed and waned since that point, and she was seen at urgent care today.  She was sent for rule out DVT.  The history is provided by the patient.  Leg Pain Location:  Leg Time since incident:  1 week Injury: no   Leg location:  L upper leg and L lower leg Pain details:    Quality: sore.   Radiates to:  Does not radiate   Severity:  Mild   Onset quality:  Gradual   Duration:  1 week   Timing:  Constant   Progression:  Unchanged Chronicity:  New Prior injury to area:  No Relieved by:  Nothing Worsened by:  Flexion Ineffective treatments:  None tried Associated symptoms: swelling   Associated symptoms: no back pain, no decreased ROM, no fever, no muscle weakness, no numbness and no tingling       Past Medical History:  Diagnosis Date   Allergy    Ankle fracture, left    Anxiety    Asthma    Chicken pox    Elbow fracture    Heart murmur    Hyperlipemia    Hypertension     Patient Active Problem List   Diagnosis Date Noted   Strain of calf muscle 12/29/2020   Preventative health care 11/17/2020   Amenorrhea 09/24/2019   Anxiety 05/28/2018   Left wrist pain 09/21/2015   Pain of left heel 09/21/2015   Vaginitis and vulvovaginitis 03/23/2015   Rash and nonspecific skin eruption 01/01/2015   Essential hypertension 06/16/2014   Conjunctivitis 04/09/2014   Hyperlipidemia 02/28/2013   Multiple allergies 02/28/2013   Asthma, mild intermittent 02/28/2013   Obesity (BMI 30-39.9) 02/27/2013    Past Surgical History:  Procedure Laterality Date    ELBOW FRACTURE SURGERY Left    WISDOM TOOTH EXTRACTION       OB History   No obstetric history on file.     Family History  Problem Relation Age of Onset   Hypertension Mother    Hypertension Father    Heart disease Father        cabg.6   Diabetes Maternal Grandmother    Cancer Maternal Grandmother        Unsure of kind? Breast   Colon cancer Neg Hx    Esophageal cancer Neg Hx    Rectal cancer Neg Hx    Stomach cancer Neg Hx     Social History   Tobacco Use   Smoking status: Never   Smokeless tobacco: Never  Vaping Use   Vaping Use: Never used  Substance Use Topics   Alcohol use: Yes    Alcohol/week: 0.0 standard drinks    Comment: Rare   Drug use: No    Home Medications Prior to Admission medications   Medication Sig Start Date End Date Taking? Authorizing Provider  atorvastatin (LIPITOR) 40 MG tablet Take 1 tablet (40 mg total) by mouth daily. 11/17/20   Ann Held, DO  azelastine (ASTELIN) 0.1 % nasal spray Place 1 spray  into both nostrils 2 (two) times daily. 11/17/20   [name] R, DO  beclomethasone (QVAR REDIHALER) 80 MCG/ACT inhaler USE 2 INHALATIONS TWICE A DAY 11/17/20   Carollee Herter, Alferd Apa, DO  carvedilol (COREG) 25 MG tablet Take 1 tablet (25 mg total) by mouth 2 (two) times daily with a meal. 11/17/20   Carollee Herter, Yvonne R, DO  escitalopram (LEXAPRO) 10 MG tablet TAKE 1 TABLET DAILY (DUE FOR FOLLOW UP WITH PRIMARY CARE PHYSICIAN) 11/17/20   Carollee Herter, Alferd Apa, DO  fluticasone (FLONASE) 50 MCG/ACT nasal spray Place into both nostrils daily.    [provider]  ipratropium (ATROVENT) 0.06 % nasal spray Place into both nostrils. 10/05/20   [provider]  irbesartan (AVAPRO) 150 MG tablet Take 1 tablet (150 mg total) by mouth daily. 11/17/20   Ann Held, DO  Levocetirizine Dihydrochloride (XYZAL ALLERGY 24HR PO) Take 1 tablet by mouth daily.    [provider]  montelukast (SINGULAIR) 10 MG tablet  TAKE 1 TABLET BY MOUTH EVERY DAY 11/17/20   Carollee Herter, Alferd Apa, DO  albuterol (PROVENTIL HFA;VENTOLIN HFA) 108 (90 BASE) MCG/ACT inhaler Inhale 1-2 puffs into the lungs every 6 (six) hours as needed for wheezing or shortness of breath. 05/03/14 09/04/19  Harden Mo, MD  cloNIDine (CATAPRES) 0.1 MG tablet TAKE 1 TABLET (0.1 MG TOTAL) BY MOUTH 2 (TWO) TIMES DAILY. 07/22/19 09/04/19  Ann Held, DO  hydrochlorothiazide (HYDRODIURIL) 25 MG tablet Take 1 tablet (25 mg total) by mouth daily. 06/14/19 09/04/19  Ann Held, DO    Allergies    Naproxen, Amlodipine, Penicillins, and Tetracyclines & related  Review of Systems   Review of Systems  Constitutional:  Negative for chills and fever.  HENT:  Negative for ear pain and sore throat.   Eyes:  Negative for pain and visual disturbance.  Respiratory:  Negative for cough and shortness of breath.   Cardiovascular:  Positive for leg swelling. Negative for chest pain and palpitations.  Gastrointestinal:  Negative for abdominal pain and vomiting.  Genitourinary:  Negative for dysuria and hematuria.  Musculoskeletal:  Negative for arthralgias and back pain.  Skin:  Negative for color change and rash.  Neurological:  Negative for seizures and syncope.  All other systems reviewed and are negative.  Physical Exam Updated Vital Signs BP (!) 140/99   Pulse 66   Temp 98.4 F (36.9 C) (Oral)   Resp 16   LMP 04/26/2014 (LMP Unknown)   SpO2 97%   Physical Exam Vitals and nursing note reviewed.  HENT:     Head: Normocephalic and atraumatic.  Eyes:     General: No scleral icterus. Pulmonary:     Effort: Pulmonary effort is normal. No respiratory distress.  Musculoskeletal:     Cervical back: Normal range of motion.     Comments: Fullness in popliteal fossa  No knee effusion.  Knee range of motion normal.  No lower extremity swelling.  Compartments are soft.  No muscular tenderness.  Distal pulses are normal.  Sensation is  normal.  Skin:    General: Skin is warm and dry.  Neurological:     Mental Status: She is alert.  Psychiatric:        Mood and Affect: Mood normal.    ED Results / Procedures / Treatments   Labs (all labs ordered are listed, but only abnormal results are displayed) Labs Reviewed  BASIC METABOLIC PANEL  CBC WITH DIFFERENTIAL/PLATELET  EKG None  Radiology VAS [place] LOWER EXTREMITY VENOUS (DVT) ([facility] and [facility] 7a-7p)  Result Date: 12/29/2020  Lower Venous DVT Study Patient Name:  MARKEITA ALICIA  Date of Exam:   12/29/2020 Medical Rec #: [id number]   Accession #:    [id number] Date of Birth: [birth year]   Patient Gender: F Patient Age:   47 years Exam Location:  [facility] Procedure:      VAS [place] LOWER EXTREMITY VENOUS (DVT) Referring Phys: GRACE LOEFFLER --------------------------------------------------------------------------------  Indications: Pain and swelling of LT knee.  Comparison Study: No prior studies. Performing Technologist: Darlin Coco RDMS, RVT  Examination Guidelines: A complete evaluation includes B-mode imaging, spectral Doppler, color Doppler, and power Doppler as needed of all accessible portions of each vessel. Bilateral testing is considered an integral part of a complete examination. Limited examinations for reoccurring indications may be performed as noted. The reflux portion of the exam is performed with the patient in reverse Trendelenburg.  +-----+---------------+---------+-----------+----------+--------------+ RIGHTCompressibilityPhasicitySpontaneityPropertiesThrombus Aging +-----+---------------+---------+-----------+----------+--------------+ CFV  Full           Yes      Yes                                 +-----+---------------+---------+-----------+----------+--------------+   +---------+---------------+---------+-----------+----------+--------------+ LEFT     CompressibilityPhasicitySpontaneityPropertiesThrombus Aging  +---------+---------------+---------+-----------+----------+--------------+ CFV      Full           Yes      Yes                                 +---------+---------------+---------+-----------+----------+--------------+ SFJ      Full                                                        +---------+---------------+---------+-----------+----------+--------------+ FV Prox  Full                                                        +---------+---------------+---------+-----------+----------+--------------+ FV Mid   Full                                                        +---------+---------------+---------+-----------+----------+--------------+ FV DistalFull                                                        +---------+---------------+---------+-----------+----------+--------------+ PFV      Full                                                        +---------+---------------+---------+-----------+----------+--------------+ POP  Full           Yes      Yes                                 +---------+---------------+---------+-----------+----------+--------------+ PTV      Full                                                        +---------+---------------+---------+-----------+----------+--------------+ PERO     Full                                                        +---------+---------------+---------+-----------+----------+--------------+     Summary: RIGHT: - No evidence of common femoral vein obstruction.  LEFT: - There is no evidence of deep vein thrombosis in the lower extremity.  - A cystic structure is found in the popliteal fossa.  *See table(s) above for measurements and observations.    Preliminary     Procedures Procedures   Medications Ordered in ED Medications - No data to display  ED Course  I have reviewed the triage vital signs and the nursing notes.  Pertinent labs & imaging results that were available  during my care of the patient were reviewed by me and considered in my medical decision making (see chart for details).    MDM Rules/Calculators/A&P                           Nyashia Raney presents with posterior knee swelling.  No evidence of DVT on ultrasound.  Rather, findings are consistent with synovial cyst.  Labs do not support other etiology of swelling.  No indication clinically or via labs for renal failure.  No evidence of florid congestive heart failure.  No CHF, myositis, or other emergent etiology of pain. Final Clinical Impression(s) / ED Diagnoses Final diagnoses:  Synovial cyst of knee, left    Rx / DC Orders ED Discharge Orders     None        Arnaldo Natal, MD 12/29/20 1539

## 2021-01-12 ENCOUNTER — Ambulatory Visit
Admission: RE | Admit: 2021-01-12 | Discharge: 2021-01-12 | Disposition: A | Discharge status: Home / Self Care | Payer: Managed Care, Other (non HMO) | Source: Ambulatory Visit | Attending: Family Medicine | Admitting: Family Medicine

## 2021-01-12 DIAGNOSIS — Z1231 Encounter for screening mammogram for malignant neoplasm of breast: Secondary | ICD-10-CM

## 2021-04-05 ENCOUNTER — Ambulatory Visit (HOSPITAL_COMMUNITY)
Admission: EM | Admit: 2021-04-05 | Discharge: 2021-04-05 | Disposition: A | Discharge status: Home / Self Care | Payer: Managed Care, Other (non HMO) | Attending: Family Medicine | Admitting: Family Medicine

## 2021-04-05 ENCOUNTER — Encounter (HOSPITAL_COMMUNITY): Payer: Self-pay

## 2021-04-05 ENCOUNTER — Other Ambulatory Visit: Payer: Self-pay

## 2021-04-05 DIAGNOSIS — U071 COVID-19: Secondary | ICD-10-CM

## 2021-04-05 MED ORDER — NIRMATRELVIR/RITONAVIR (PAXLOVID)TABLET: ORAL_TABLET | ORAL | 0 refills | Status: DC

## 2021-04-05 MED ORDER — ALBUTEROL SULFATE HFA 108 (90 BASE) MCG/ACT IN AERS: 1.0000 | INHALATION_SPRAY | RESPIRATORY_TRACT | 0 refills | Status: DC | PRN

## 2021-04-05 NOTE — ED Provider Notes (Signed)
[facility]    CSN: [id number] Arrival date & time: 04/05/21  1408      History   Chief Complaint Chief Complaint  Patient presents with   Covid +    HPI Sandy Green is a 61 y.o. female.   HPI Here for mild h/a, rhinorrhea and cough, and also malaise, which all began today. No f/c/n/v/d. Tested positive at home for covid this AM.  BMI is 31.1 PMH: has htn, on meds. H/o asthma. No wheezing currently.  Cr 0.99, eGFR >60 in September  Past Medical History:  Diagnosis Date   Allergy    Ankle fracture, left    Anxiety    Asthma    Chicken pox    Elbow fracture    Heart murmur    Hyperlipemia    Hypertension     Patient Active Problem List   Diagnosis Date Noted   Strain of calf muscle 12/29/2020   Preventative health care 11/17/2020   Amenorrhea 09/24/2019   Anxiety 05/28/2018   Left wrist pain 09/21/2015   Pain of left heel 09/21/2015   Vaginitis and vulvovaginitis 03/23/2015   Rash and nonspecific skin eruption 01/01/2015   Essential hypertension 06/16/2014   Conjunctivitis 04/09/2014   Hyperlipidemia 02/28/2013   Multiple allergies 02/28/2013   Asthma, mild intermittent 02/28/2013   Obesity (BMI 30-39.9) 02/27/2013    Past Surgical History:  Procedure Laterality Date   ELBOW FRACTURE SURGERY Left    WISDOM TOOTH EXTRACTION      OB History   No obstetric history on file.      Home Medications    Prior to Admission medications   Medication Sig Start Date End Date Taking? Authorizing Provider  albuterol (VENTOLIN HFA) 108 (90 Base) MCG/ACT inhaler Inhale 1-2 puffs into the lungs every 4 (four) hours as needed for wheezing or shortness of breath. 04/05/21  Yes Barrett Henle, MD  nirmatrelvir/ritonavir EUA (PAXLOVID) 20 x 150 MG & 10 x 100MG TABS Patient GFR is >60. Symptom onset 04/05/21. Risk factors are htn, and BMI elevated. Take nirmatrelvir (150 mg) two tablets twice daily for 5 days and ritonavir (100 mg) one tablet twice daily for  5 days. 04/05/21  Yes Barrett Henle, MD  atorvastatin (LIPITOR) 40 MG tablet Take 1 tablet (40 mg total) by mouth daily. 11/17/20   Ann Held, DO  azelastine (ASTELIN) 0.1 % nasal spray Place 1 spray into both nostrils 2 (two) times daily. 11/17/20   [name] R, DO  beclomethasone (QVAR REDIHALER) 80 MCG/ACT inhaler USE 2 INHALATIONS TWICE A DAY 11/17/20   Carollee Herter, Alferd Apa, DO  carvedilol (COREG) 25 MG tablet Take 1 tablet (25 mg total) by mouth 2 (two) times daily with a meal. 11/17/20   Carollee Herter, Yvonne R, DO  escitalopram (LEXAPRO) 10 MG tablet TAKE 1 TABLET DAILY (DUE FOR FOLLOW UP WITH PRIMARY CARE PHYSICIAN) 11/17/20   Carollee Herter, Alferd Apa, DO  fluticasone (FLONASE) 50 MCG/ACT nasal spray Place into both nostrils daily.    [provider]  ipratropium (ATROVENT) 0.06 % nasal spray Place into both nostrils. 10/05/20   [provider]  irbesartan (AVAPRO) 150 MG tablet Take 1 tablet (150 mg total) by mouth daily. 11/17/20   Ann Held, DO  Levocetirizine Dihydrochloride (XYZAL ALLERGY 24HR PO) Take 1 tablet by mouth daily.    [provider]  montelukast (SINGULAIR) 10 MG tablet TAKE 1 TABLET BY MOUTH EVERY DAY 11/17/20  Carollee Herter, Yvonne R, DO  cloNIDine (CATAPRES) 0.1 MG tablet TAKE 1 TABLET (0.1 MG TOTAL) BY MOUTH 2 (TWO) TIMES DAILY. 07/22/19 09/04/19  Ann Held, DO  hydrochlorothiazide (HYDRODIURIL) 25 MG tablet Take 1 tablet (25 mg total) by mouth daily. 06/14/19 09/04/19  Ann Held, DO    Family History Family History  Problem Relation Age of Onset   Hypertension Mother    Hypertension Father    Heart disease Father        cabg.6   Diabetes Maternal Grandmother    Cancer Maternal Grandmother        Unsure of kind? Breast   Colon cancer Neg Hx    Esophageal cancer Neg Hx    Rectal cancer Neg Hx    Stomach cancer Neg Hx     Social History Social History   Tobacco Use   Smoking status:  Never   Smokeless tobacco: Never  Vaping Use   Vaping Use: Never used  Substance Use Topics   Alcohol use: Yes    Alcohol/week: 0.0 standard drinks    Comment: Rare   Drug use: No     Allergies   Naproxen, Amlodipine, Penicillins, and Tetracyclines & related   Review of Systems Review of Systems   Physical Exam Triage Vital Signs ED Triage Vitals  Enc Vitals Group     BP 04/05/21 1733 (!) 152/99     Pulse Rate 04/05/21 1733 76     Resp 04/05/21 1733 18     Temp 04/05/21 1733 98.8 F (37.1 C)     Temp Source 04/05/21 1733 Oral     SpO2 04/05/21 1733 96 %     Weight --      Height --      Head Circumference --      Peak Flow --      Pain Score 04/05/21 1732 4     Pain Loc --      Pain Edu? --      Excl. in [place]? --    No data found.  Updated Vital Signs BP (!) 152/99 (BP Location: Right Arm)    Pulse 76    Temp 98.8 F (37.1 C) (Oral)    Resp 18    LMP 04/26/2014 (LMP Unknown)    SpO2 96%   Visual Acuity Right Eye Distance:   Left Eye Distance:   Bilateral Distance:    Right Eye Near:   Left Eye Near:    Bilateral Near:     Physical Exam Vitals reviewed.  Constitutional:      General: She is not in acute distress.    Appearance: She is not toxic-appearing.  HENT:     Right Ear: Tympanic membrane and ear canal normal.     Left Ear: Tympanic membrane and ear canal normal.     Nose: Nose normal.     Mouth/Throat:     Mouth: Mucous membranes are moist.     Pharynx: No oropharyngeal exudate or posterior oropharyngeal erythema.  Eyes:     Extraocular Movements: Extraocular movements intact.     Conjunctiva/sclera: Conjunctivae normal.     Pupils: Pupils are equal, round, and reactive to light.  Cardiovascular:     Rate and Rhythm: Normal rate and regular rhythm.     Heart sounds: No murmur heard. Pulmonary:     Effort: Pulmonary effort is normal.     Breath sounds: Normal breath sounds. No wheezing, rhonchi or rales.  Musculoskeletal:  Cervical  back: Neck supple.  Lymphadenopathy:     Cervical: No cervical adenopathy.  Skin:    Capillary Refill: Capillary refill takes less than 2 seconds.     Coloration: Skin is not jaundiced or pale.  Neurological:     General: No focal deficit present.     Mental Status: She is alert and oriented to person, place, and time.  Psychiatric:        Behavior: Behavior normal.     UC Treatments / Results  Labs (all labs ordered are listed, but only abnormal results are displayed) Labs Reviewed - No data to display  EKG   Radiology No results found.  Procedures Procedures (including critical care time)  Medications Ordered in UC Medications - No data to display  Initial Impression / Assessment and Plan / UC Course  I have reviewed the triage vital signs and the nursing notes.  Pertinent labs & imaging results that were available during my care of the patient were reviewed by me and considered in my medical decision making (see chart for details).     With her risk factors of elevated BMI and htn, will treat with the paxlovid. Final Clinical Impressions(s) / UC Diagnoses   Final diagnoses:  COVID     Discharge Instructions      Paxlovid as directed per package instructions Albuterol inhaler as needed.      ED Prescriptions     Medication Sig Dispense Auth. Provider   nirmatrelvir/ritonavir EUA (PAXLOVID) 20 x 150 MG & 10 x 100MG TABS Patient GFR is >60. Symptom onset 04/05/21. Risk factors are htn, and BMI elevated. Take nirmatrelvir (150 mg) two tablets twice daily for 5 days and ritonavir (100 mg) one tablet twice daily for 5 days. 30 tablet Antwonette Feliz, Gwenlyn Perking, MD   albuterol (VENTOLIN HFA) 108 (90 Base) MCG/ACT inhaler Inhale 1-2 puffs into the lungs every 4 (four) hours as needed for wheezing or shortness of breath. 1 each Barrett Henle, MD      PDMP not reviewed this encounter.   Barrett Henle, MD 04/05/21 [phone]

## 2021-04-05 NOTE — ED Triage Notes (Signed)
Pt presents with slight headache, nasal drainage, and slight cough; pt tested positive for covid this morning.  Pt states she took tylenol this morning.

## 2021-04-05 NOTE — Discharge Instructions (Addendum)
Paxlovid as directed per package instructions Albuterol inhaler as needed.

## 2021-04-06 ENCOUNTER — Telehealth: Payer: Self-pay

## 2021-04-06 NOTE — Telephone Encounter (Signed)
Nurse Assessment Nurse: Zenia Resides, RN, Diane Date/Time Eilene Ghazi Time): 04/05/2021 12:12:53 PM Confirm and document reason for call. If symptomatic, describe symptoms. ---Caller tested pos for covid. She has sore throat, congestion, and headache. Hasn't checked her temp. No chest pain or pressure or sob. Symptoms started last night. Does the patient have any new or worsening symptoms? ---Yes Will a triage be completed? ---Yes Related visit to physician within the last 2 weeks? ---No Does the PT have any chronic conditions? (i.e. diabetes, asthma, this includes High risk factors for pregnancy, etc.) ---Yes List chronic conditions. ---HTN, asthma Is this a behavioral health or substance abuse call? ---No Guidelines Guideline Title Affirmed Question Affirmed Notes Nurse Date/Time (Eastern Time) COVID-19 - Diagnosed or Suspected [1] HIGH RISK for severe COVID complications (e.g., weak immune system, age > 66 years, obesity with BMI 30 or higher, pregnant, chronic lung disease or other chronic medical Zenia Resides, RN, Diane 04/05/2021 12:13:46 PM PLEASE NOTE: All timestamps contained within this report are represented as [place] Standard Time. CONFIDENTIALTY NOTICE: This fax transmission is intended only for the addressee. It contains information that is legally privileged, confidential or otherwise protected from use or disclosure. If you are not the intended recipient, you are strictly prohibited from reviewing, disclosing, copying using or disseminating any of this information or taking any action in reliance on or regarding this information. If you have received this fax in error, please notify us immediately by telephone so that we can arrange for its return to [place]. Phone: [phone], Toll-Free: [phone], Fax: [phone] Page: 2 of 2 Call Id: [id number] Guidelines Guideline Title Affirmed Question Affirmed Notes Nurse Date/Time Eilene Ghazi Time) condition) AND [2] COVID  symptoms (e.g., cough, fever) (Exceptions: Already seen by PCP and no new or worsening symptoms.) Disp. Time Eilene Ghazi Time) Disposition Final User 04/05/2021 12:16:02 PM Call PCP within 24 Hours Yes Zenia Resides, RN, Diane Caller Disagree/Comply Comply Caller Understands Yes PreDisposition Call Doctor Care Advice Given Per Guideline CALL PCP WITHIN 24 HOURS: * You need to discuss this with your doctor (or NP/PA) within the next 24 hours. COUGH MEDICINES: * COUGH DROPS: Over-the-counter cough drops can help a lot, especially for mild coughs. They soothe an irritated throat and remove the tickle sensation in the back of the throat. Cough drops are easy to carry with you. * COUGH SYRUP WITH DEXTROMETHORPHAN: An over-the-counter cough syrup can help your cough. The most common cough suppressant in overthe-counter cough medicines is dextromethorphan. CALL BACK IF: * You become worse CARE ADVICE given per COVID-19 - DIAGNOSED OR SUSPECTED (Adult) guideline. Referrals REFERRED TO PCP OFFICE   Pt seen at ED.

## 2021-06-22 ENCOUNTER — Other Ambulatory Visit: Payer: Self-pay

## 2021-06-22 ENCOUNTER — Emergency Department (HOSPITAL_BASED_OUTPATIENT_CLINIC_OR_DEPARTMENT_OTHER)
Admission: EM | Admit: 2021-06-22 | Discharge: 2021-06-22 | Disposition: A | Discharge status: Home / Self Care | Payer: Managed Care, Other (non HMO) | Attending: Emergency Medicine | Admitting: Emergency Medicine

## 2021-06-22 ENCOUNTER — Other Ambulatory Visit (HOSPITAL_BASED_OUTPATIENT_CLINIC_OR_DEPARTMENT_OTHER): Payer: Self-pay

## 2021-06-22 DIAGNOSIS — Z7951 Long term (current) use of inhaled steroids: Secondary | ICD-10-CM | POA: Diagnosis not present

## 2021-06-22 DIAGNOSIS — R040 Epistaxis: Secondary | ICD-10-CM | POA: Diagnosis present

## 2021-06-22 DIAGNOSIS — J45909 Unspecified asthma, uncomplicated: Secondary | ICD-10-CM | POA: Diagnosis not present

## 2021-06-22 DIAGNOSIS — I10 Essential (primary) hypertension: Secondary | ICD-10-CM | POA: Insufficient documentation

## 2021-06-22 LAB — BASIC METABOLIC PANEL
Anion gap: 7 (ref 5–15)
BUN: 18 mg/dL (ref 6–20)
CO2: 26 mmol/L (ref 22–32)
Calcium: 9 mg/dL (ref 8.9–10.3)
Chloride: 108 mmol/L (ref 98–111)
Creatinine, Ser: 1.03 mg/dL — ABNORMAL HIGH (ref 0.44–1.00)
GFR, Estimated: >60 mL/min (ref >60)
Glucose, Bld: 110 mg/dL — ABNORMAL HIGH (ref 70–99)
Potassium: 3.6 mmol/L (ref 3.5–5.1)
Sodium: 141 mmol/L (ref 135–145)

## 2021-06-22 LAB — CBC WITH DIFFERENTIAL/PLATELET
Abs Immature Granulocytes: 0.01 10*3/uL (ref 0.00–0.07)
Basophils Absolute: 0.1 10*3/uL (ref 0.0–0.1)
Basophils Relative: 1 %
Eosinophils Absolute: 0.2 10*3/uL (ref 0.0–0.5)
Eosinophils Relative: 3 %
HCT: 40.4 % (ref 36.0–46.0)
Hemoglobin: 13.1 g/dL (ref 12.0–15.0)
Immature Granulocytes: 0 %
Lymphocytes Relative: 45 %
Lymphs Abs: 2.9 10*3/uL (ref 0.7–4.0)
MCH: 29.7 pg (ref 26.0–34.0)
MCHC: 32.4 g/dL (ref 30.0–36.0)
MCV: 91.6 fL (ref 80.0–100.0)
Monocytes Absolute: 0.6 10*3/uL (ref 0.1–1.0)
Monocytes Relative: 9 %
Neutro Abs: 2.7 10*3/uL (ref 1.7–7.7)
Neutrophils Relative %: 42 %
Platelets: 227 10*3/uL (ref 150–400)
RBC: 4.41 MIL/uL (ref 3.87–5.11)
RDW: 13.4 % (ref 11.5–15.5)
WBC: 6.5 10*3/uL (ref 4.0–10.5)
nRBC: 0 % (ref 0.0–0.2)

## 2021-06-22 LAB — PROTIME-INR
INR: 1.1 (ref 0.8–1.2)
Prothrombin Time: 14.2 seconds (ref 11.4–15.2)

## 2021-06-22 MED ORDER — OXYMETAZOLINE HCL 0.05 % NA SOLN
1.0000 | Freq: Two times a day (BID) | NASAL | 0 refills | Status: DC
  Filled 2021-06-22: qty 30, 30d supply, fill #0

## 2021-06-22 MED ORDER — LIDOCAINE-EPINEPHRINE 2 %-1:100000 IJ SOLN
20.0000 mL | Freq: Once | INTRAMUSCULAR | Status: AC
  Administered 2021-06-22: 20 mL
  Filled 2021-06-22: qty 20.4

## 2021-06-22 MED ORDER — OXYMETAZOLINE HCL 0.05 % NA SOLN
1.0000 | Freq: Once | NASAL | Status: AC
  Administered 2021-06-22: 1 via NASAL
  Filled 2021-06-22: qty 30

## 2021-06-22 MED ORDER — TRANEXAMIC ACID 1000 MG/10ML IV SOLN
500.0000 mg | Freq: Once | INTRAVENOUS | Status: AC
  Administered 2021-06-22: 500 mg via TOPICAL
  Filled 2021-06-22: qty 10

## 2021-06-22 NOTE — ED Triage Notes (Signed)
Patient arrives with complaints of heavy nose bleeding x2 hours. Patient tired to get control of the bleeding at home by pinching her nose with no relief. Patient arrive still bleeding from nose and spitting blood out of mouth. Spouse at bedside. She reports prior nosebleeds, but none to this extent.  ?

## 2021-06-22 NOTE — ED Notes (Signed)
Epistaxis controlled at this time.

## 2021-06-22 NOTE — Discharge Instructions (Addendum)
You were evaluated in the Emergency Department and after careful evaluation, we did not find any emergent condition requiring admission or further testing in the hospital. ? ?Your exam/testing today was overall reassuring.  Your left-sided nosebleed resolved after administration of TXA, Afrin and lidocaine and pressure.  There was no need for packing after observation in the emergency department with no recurrence of bleeding.  Recommend Afrin as needed but do not use more than 3 days as long-term use of Afrin can cause nasal septal necrosis. ? ?Please return to the Emergency Department if you experience any worsening of your condition.  Thank you for allowing [place] to be a part of your care. ? ?

## 2021-06-22 NOTE — ED Provider Notes (Signed)
?[facility] ?Provider Note ? ? ?CSN: [id number] ?Arrival date & time: 06/22/21  2376 ? ?  ? ?History ? ?Chief Complaint  ?Patient presents with  ? Epistaxis  ? ? ?Sandy Green is a 61 y.o. female. ? ? ?Epistaxis ?Location:  L nare ?Severity:  Severe ?Duration:  2 hours ?Timing:  Constant ?Progression:  Unchanged ?Chronicity:  Recurrent ?Context: not anticoagulants, not bleeding disorder and not nose picking   ?Relieved by:  Nothing ?Ineffective treatments:  Applying pressure ?Associated symptoms: blood in oropharynx   ?Associated symptoms: no congestion and no syncope   ? ?  ? ?Home Medications ?Prior to Admission medications   ?Medication Sig Start Date End Date Taking? Authorizing Provider  ?oxymetazoline (AFRIN NASAL SPRAY) 0.05 % nasal spray Place 1 spray into both nostrils 2 (two) times daily. 06/22/21  Yes Regan Lemming, MD  ?albuterol (VENTOLIN HFA) 108 (90 Base) MCG/ACT inhaler Inhale 1-2 puffs into the lungs every 4 (four) hours as needed for wheezing or shortness of breath. 04/05/21   Barrett Henle, MD  ?atorvastatin (LIPITOR) 40 MG tablet Take 1 tablet (40 mg total) by mouth daily. 11/17/20   Ann Held, DO  ?azelastine (ASTELIN) 0.1 % nasal spray Place 1 spray into both nostrils 2 (two) times daily. 11/17/20   [name] R, DO  ?beclomethasone (QVAR REDIHALER) 80 MCG/ACT inhaler USE 2 INHALATIONS TWICE A DAY 11/17/20   Ann Held, DO  ?carvedilol (COREG) 25 MG tablet Take 1 tablet (25 mg total) by mouth 2 (two) times daily with a meal. 11/17/20   Carollee Herter, Alferd Apa, DO  ?escitalopram (LEXAPRO) 10 MG tablet TAKE 1 TABLET DAILY (DUE FOR FOLLOW UP WITH PRIMARY CARE PHYSICIAN) 11/17/20   Carollee Herter, Alferd Apa, DO  ?fluticasone (FLONASE) 50 MCG/ACT nasal spray Place into both nostrils daily.    [provider]  ?ipratropium (ATROVENT) 0.06 % nasal spray Place into both nostrils. 10/05/20   [provider]  ?irbesartan (AVAPRO) 150 MG  tablet Take 1 tablet (150 mg total) by mouth daily. 11/17/20   Ann Held, DO  ?Levocetirizine Dihydrochloride (XYZAL ALLERGY 24HR PO) Take 1 tablet by mouth daily.    [provider]  ?montelukast (SINGULAIR) 10 MG tablet TAKE 1 TABLET BY MOUTH EVERY DAY 11/17/20   Ann Held, DO  ?nirmatrelvir/ritonavir EUA (PAXLOVID) 20 x 150 MG & 10 x '100MG'$  TABS Patient GFR is >60. Symptom onset 04/05/21. Risk factors are htn, and BMI elevated. Take nirmatrelvir (150 mg) two tablets twice daily for 5 days and ritonavir (100 mg) one tablet twice daily for 5 days. 04/05/21   Barrett Henle, MD  ?cloNIDine (CATAPRES) 0.1 MG tablet TAKE 1 TABLET (0.1 MG TOTAL) BY MOUTH 2 (TWO) TIMES DAILY. 07/22/19 09/04/19  Ann Held, DO  ?hydrochlorothiazide (HYDRODIURIL) 25 MG tablet Take 1 tablet (25 mg total) by mouth daily. 06/14/19 09/04/19  Ann Held, DO  ?   ? ?Allergies    ?Naproxen, Amlodipine, Penicillins, and Tetracyclines & related   ? ?Review of Systems   ?Review of Systems  ?HENT:  Positive for nosebleeds. Negative for congestion.   ?Cardiovascular:  Negative for syncope.  ? ?Physical Exam ?Updated Vital Signs ?BP (!) 154/102   Pulse 76   Temp (!) 97.1 ?F (36.2 ?C) (Axillary)   Resp 16   Ht '5\' 6"'$  (1.676 m)   Wt 85.3 kg   LMP 04/26/2014 (LMP Unknown)  SpO2 100%   BMI 30.34 kg/m?  ?Physical Exam ?Vitals and nursing note reviewed.  ?Constitutional:   ?   General: She is not in acute distress. ?HENT:  ?   Head: Normocephalic and atraumatic.  ?   Nose:  ?   Right Nostril: No epistaxis.  ?   Left Nostril: Epistaxis present.  ?   Comments: Active epistaxis from the left nare ?Eyes:  ?   Conjunctiva/sclera: Conjunctivae normal.  ?   Pupils: Pupils are equal, round, and reactive to light.  ?Cardiovascular:  ?   Rate and Rhythm: Normal rate and regular rhythm.  ?Pulmonary:  ?   Effort: Pulmonary effort is normal. No respiratory distress.  ?Abdominal:  ?   General: There is no distension.   ?   Tenderness: There is no guarding.  ?Musculoskeletal:     ?   General: No deformity or signs of injury.  ?   Cervical back: Neck supple.  ?Skin: ?   Findings: No lesion or rash.  ?Neurological:  ?   General: No focal deficit present.  ?   Mental Status: She is alert. Mental status is at baseline.  ? ? ?ED Results / Procedures / Treatments   ?Labs ?(all labs ordered are listed, but only abnormal results are displayed) ?Labs Reviewed  ?BASIC METABOLIC PANEL - Abnormal; Notable for the following components:  ?    Result Value  ? Glucose, Bld 110 (*)   ? Creatinine, Ser 1.03 (*)   ? All other components within normal limits  ?CBC WITH DIFFERENTIAL/PLATELET  ?PROTIME-INR  ? ? ?EKG ?None ? ?Radiology ?No results found. ? ?Procedures ?Procedures  ? ? ?Medications Ordered in ED ?Medications  ?oxymetazoline (AFRIN) 0.05 % nasal spray 1 spray (1 spray Each Nare Given 06/22/21 1005)  ?tranexamic acid (CYKLOKAPRON) injection 500 mg (500 mg Topical Given 06/22/21 1005)  ?lidocaine-EPINEPHrine (XYLOCAINE W/EPI) 2 %-1:100000 (with pres) injection 20 mL (20 mLs Other Given 06/22/21 1005)  ? ? ?ED Course/ Medical Decision Making/ A&P ?  ?                        ?Medical Decision Making ?Amount and/or Complexity of Data Reviewed ?Labs: ordered. ? ?Risk ?OTC drugs. ?Prescription drug management. ? ? ?61 year old female with medical history significant for asthma, HTN, HLD, anxiety, previous episodes of epistaxis who presents to the emergency department with a chief complaint of epistaxis.  The patient denies a history of bleeding disorders.  She denies a history of anticoagulant use.  She denies a history of picking at her nose.  She states she utilizes nasal sprays for allergies.  She has had problems with epistaxis in the past.  She developed acute onset left nare epistaxis 2 hours ago that was not alleviated by pressure so she presented to the emergency department for further evaluation and management.  On arrival, the patient was  hemodynamically stable, mildly hypertensive BP 154/108.  Active epistaxis was noted from the left nare.  The patient blew her left nare and large clots were passed.  A mixture of TXA, Afrin and lidocaine with epinephrine was then applied to the nares bilaterally via nebulizer. ? ?On reassessment, symptoms had resolved with no further episodes of bleeding.  Provided return precautions and a short 3-day course of Afrin as needed.  Advised ENT  to follow-up. ? ?Final Clinical Impression(s) / ED Diagnoses ?Final diagnoses:  ?Epistaxis  ? ? ?Rx / DC Orders ?ED Discharge Orders   ? ?  Ordered  ?  Ambulatory referral to ENT       ? 06/22/21 1130  ?  oxymetazoline (AFRIN NASAL SPRAY) 0.05 % nasal spray  2 times daily       ? 06/22/21 1131  ? ?  ?  ? ?  ? ? ?  ?Regan Lemming, MD ?06/22/21 1131 ? ?

## 2021-07-02 ENCOUNTER — Other Ambulatory Visit (HOSPITAL_BASED_OUTPATIENT_CLINIC_OR_DEPARTMENT_OTHER): Payer: Self-pay

## 2021-07-05 ENCOUNTER — Other Ambulatory Visit (HOSPITAL_BASED_OUTPATIENT_CLINIC_OR_DEPARTMENT_OTHER): Payer: Self-pay

## 2021-08-24 ENCOUNTER — Encounter: Payer: Self-pay | Admitting: Gastroenterology

## 2021-10-25 ENCOUNTER — Other Ambulatory Visit: Payer: Self-pay | Admitting: Family Medicine

## 2021-10-25 DIAGNOSIS — J4520 Mild intermittent asthma, uncomplicated: Secondary | ICD-10-CM

## 2021-11-12 ENCOUNTER — Other Ambulatory Visit: Payer: Self-pay | Admitting: Family Medicine

## 2021-11-12 DIAGNOSIS — F419 Anxiety disorder, unspecified: Secondary | ICD-10-CM

## 2021-11-12 DIAGNOSIS — J4520 Mild intermittent asthma, uncomplicated: Secondary | ICD-10-CM

## 2021-12-06 ENCOUNTER — Other Ambulatory Visit: Payer: Self-pay | Admitting: Family Medicine

## 2021-12-06 DIAGNOSIS — J4520 Mild intermittent asthma, uncomplicated: Secondary | ICD-10-CM

## 2021-12-06 DIAGNOSIS — E785 Hyperlipidemia, unspecified: Secondary | ICD-10-CM

## 2021-12-15 ENCOUNTER — Ambulatory Visit: Payer: Managed Care, Other (non HMO) | Admitting: Podiatry

## 2021-12-15 ENCOUNTER — Ambulatory Visit (INDEPENDENT_AMBULATORY_CARE_PROVIDER_SITE_OTHER): Payer: Managed Care, Other (non HMO)

## 2021-12-15 ENCOUNTER — Encounter: Payer: Self-pay | Admitting: Podiatry

## 2021-12-15 DIAGNOSIS — S99921S Unspecified injury of right foot, sequela: Secondary | ICD-10-CM | POA: Diagnosis not present

## 2021-12-15 DIAGNOSIS — D169 Benign neoplasm of bone and articular cartilage, unspecified: Secondary | ICD-10-CM

## 2021-12-15 NOTE — Progress Notes (Signed)
Subjective:   Patient ID: Sandy Green, female   DOB: 61 y.o.   MRN: [id number]   HPI Patient presents stating she has had some irritation between the big toe second toe right and is not sure what might be causing it stating it is feeling better today neuro   ROS      Objective:  Physical Exam  Vascular status found to be intact with discomfort in the right hallux second digit with keratotic lesion on the second toe mild in its nature minimal discomfort     Assessment:  Possibility for a keratotic type lesion formation versus a nail disease     Plan:  H&P reviewed both conditions and applied cushioning between the 2 toes along with wider shoe gear and patient will be seen back as needed.  Understands that this may or may not solve the problem  X-rays indicate that there is pressure occurring between the 2 toes right hallux and second toe

## 2021-12-22 ENCOUNTER — Other Ambulatory Visit: Payer: Self-pay | Admitting: Family Medicine

## 2021-12-22 DIAGNOSIS — I10 Essential (primary) hypertension: Secondary | ICD-10-CM

## 2022-01-04 ENCOUNTER — Encounter: Payer: Self-pay | Admitting: Pulmonary Disease

## 2022-01-04 ENCOUNTER — Ambulatory Visit: Payer: Managed Care, Other (non HMO) | Admitting: Pulmonary Disease

## 2022-01-04 VITALS — BP 108/72 | HR 69 | Temp 97.8°F | Ht 66.0 in | Wt 194.0 lb

## 2022-01-04 DIAGNOSIS — G4733 Obstructive sleep apnea (adult) (pediatric): Secondary | ICD-10-CM | POA: Diagnosis not present

## 2022-01-04 NOTE — Patient Instructions (Signed)
Continue using CPAP nightly  I will see 6 months from here  Call us with significant concerns  If the mass continues to be aggravating, just let us know and we can reach out to the medical supply company to try different mask

## 2022-01-04 NOTE — Progress Notes (Signed)
Sandy Green    [id number]    1960/11/09  Primary Care Physician:Lowne Cheri Rous Alferd Apa, DO  Referring Physician: Carollee Herter, Alferd Apa, DO [address] [place],  [place] [address]  Chief complaint:   Patient with mild obstructive sleep apnea  HPI: Has been using CPAP regularly and tolerating it okay  Feels generally better Sleep quality is better  Denies significant daytime fatigue  Diagnosed with mild obstructive sleep apnea and has been on auto CPAP  She is having some issues with the mask however she feels she can continue to use the same mask we did talk about switching to a different interface  Usually goes to bed at about 1 AM Wake up time about 9-10 AM Usually wakes up feeling like is that a decent rest  She is really concerned about health problems  Does have a history of hypertension, asthma, hypercholesterolemia  Outpatient Encounter Medications as of 01/04/2022  Medication Sig   albuterol (VENTOLIN HFA) 108 (90 Base) MCG/ACT inhaler Inhale 1-2 puffs into the lungs every 4 (four) hours as needed for wheezing or shortness of breath.   atorvastatin (LIPITOR) 40 MG tablet Take 1 tablet (40 mg total) by mouth daily.   Azelastine HCl 137 MCG/SPRAY SOLN USE 1 SPRAY IN EACH NOSTRIL TWICE A DAY   beclomethasone (QVAR REDIHALER) 80 MCG/ACT inhaler USE 2 INHALATIONS TWICE A DAY   carvedilol (COREG) 25 MG tablet TAKE 1 TABLET TWICE A DAY WITH MEALS   escitalopram (LEXAPRO) 10 MG tablet TAKE 1 TABLET DAILY (DUE FOR FOLLOW UP WITH PRIMARY CARE PHYSICIAN)   fluticasone (FLONASE) 50 MCG/ACT nasal spray Place into both nostrils daily.   ipratropium (ATROVENT) 0.06 % nasal spray Place into both nostrils.   irbesartan (AVAPRO) 150 MG tablet Take 1 tablet (150 mg total) by mouth daily.   Levocetirizine Dihydrochloride (XYZAL ALLERGY 24HR PO) Take 1 tablet by mouth daily.   montelukast (SINGULAIR) 10 MG tablet TAKE 1 TABLET DAILY   oxymetazoline (AFRIN NASAL  SPRAY) 0.05 % nasal spray Place 1 spray into both nostrils 2 (two) times daily.   [DISCONTINUED] nirmatrelvir/ritonavir EUA (PAXLOVID) 20 x 150 MG & 10 x '100MG'$  TABS Patient GFR is >60. Symptom onset 04/05/21. Risk factors are htn, and BMI elevated. Take nirmatrelvir (150 mg) two tablets twice daily for 5 days and ritonavir (100 mg) one tablet twice daily for 5 days.   [DISCONTINUED] cloNIDine (CATAPRES) 0.1 MG tablet TAKE 1 TABLET (0.1 MG TOTAL) BY MOUTH 2 (TWO) TIMES DAILY.   [DISCONTINUED] hydrochlorothiazide (HYDRODIURIL) 25 MG tablet Take 1 tablet (25 mg total) by mouth daily.   Facility-Administered Encounter Medications as of 01/04/2022  Medication   0.9 %  sodium chloride infusion    Allergies as of 01/04/2022 - Review Complete 01/04/2022  Allergen Reaction Noted   Naproxen Other (See Comments) 07/11/2017   Amlodipine Other (See Comments) 07/11/2017   Penicillins Rash and Other (See Comments) 02/18/2013   Tetracyclines & related Nausea Only and Rash 02/27/2013    Past Medical History:  Diagnosis Date   Allergy    Ankle fracture, left    Anxiety    Asthma    Chicken pox    Elbow fracture    Heart murmur    Hyperlipemia    Hypertension     Past Surgical History:  Procedure Laterality Date   ELBOW FRACTURE SURGERY Left    WISDOM TOOTH EXTRACTION      Family  History  Problem Relation Age of Onset   Hypertension Mother    Hypertension Father    Heart disease Father        cabg.6   Diabetes Maternal Grandmother    Cancer Maternal Grandmother        Unsure of kind? Breast   Colon cancer Neg Hx    Esophageal cancer Neg Hx    Rectal cancer Neg Hx    Stomach cancer Neg Hx     Social History   Socioeconomic History   Marital status: Married    Spouse name: Not on file   Number of children: Not on file   Years of education: Not on file   Highest education level: Not on file  Occupational History   Not on file  Tobacco Use   Smoking status: Never   Smokeless  tobacco: Never  Vaping Use   Vaping Use: Never used  Substance and Sexual Activity   Alcohol use: Yes    Alcohol/week: 0.0 standard drinks of alcohol    Comment: Rare   Drug use: No   Sexual activity: Yes    Partners: Male  Other Topics Concern   Not on file  Social History Narrative   Exercise-- no    Married,lives with husband.   Unemployed.   Social Determinants of Health   Financial Resource Strain: Not on file  Food Insecurity: Not on file  Transportation Needs: Not on file  Physical Activity: Not on file  Stress: Not on file  Social Connections: Not on file  Intimate Partner Violence: Not on file   Review of Systems  Constitutional:  Negative for fatigue.  Respiratory:  Positive for apnea. Negative for shortness of breath.   Psychiatric/Behavioral:  Positive for sleep disturbance.     Vitals:   01/04/22 1514  BP: 108/72  Pulse: 69  Temp: 97.8 F (36.6 C)  SpO2: 97%     Physical Exam Constitutional:      Appearance: She is obese.  HENT:     Head: Normocephalic.     Mouth/Throat:     Mouth: Mucous membranes are moist.  Cardiovascular:     Rate and Rhythm: Normal rate and regular rhythm.     Heart sounds: No murmur heard.    No friction rub.  Pulmonary:     Effort: No respiratory distress.     Breath sounds: No stridor. No wheezing or rhonchi.  Musculoskeletal:     Cervical back: No rigidity or tenderness.  Neurological:     Mental Status: She is alert.  Psychiatric:        Mood and Affect: Mood normal.    Data Reviewed: Sleep study reviewed with the patient showing an AHI of 12.6, mild oxygen desaturations  Compliance data shows 100% compliance with CPAP Set between 5 and 15 95 percentile pressure of 9.4 Residual AHI of 0.4  Assessment:   Mild obstructive sleep apnea with daytime sleepiness -Daytime sleepiness is better  Check is compliant with CPAP use  Class I obesity -Graded exercise as tolerated    Plan/Recommendations:  I  will see you back in 6 months  Call with significant concerns  If continuing mask issues, will encouraged to give [place] a call and we can send in prescription for mask change    Sherrilyn Rist MD [facility] 01/04/2022, 3:17 PM  CC: Ann Held, *

## 2022-01-24 ENCOUNTER — Other Ambulatory Visit: Payer: Self-pay | Admitting: Family Medicine

## 2022-01-24 DIAGNOSIS — J4520 Mild intermittent asthma, uncomplicated: Secondary | ICD-10-CM

## 2022-01-25 ENCOUNTER — Encounter: Payer: Self-pay | Admitting: Family Medicine

## 2022-01-25 ENCOUNTER — Ambulatory Visit: Payer: Managed Care, Other (non HMO) | Admitting: Family Medicine

## 2022-01-25 VITALS — BP 138/82 | HR 64 | Temp 97.9°F | Resp 18 | Ht 66.0 in | Wt 198.0 lb

## 2022-01-25 DIAGNOSIS — J4520 Mild intermittent asthma, uncomplicated: Secondary | ICD-10-CM

## 2022-01-25 DIAGNOSIS — F419 Anxiety disorder, unspecified: Secondary | ICD-10-CM | POA: Diagnosis not present

## 2022-01-25 DIAGNOSIS — E785 Hyperlipidemia, unspecified: Secondary | ICD-10-CM | POA: Diagnosis not present

## 2022-01-25 DIAGNOSIS — I10 Essential (primary) hypertension: Secondary | ICD-10-CM

## 2022-01-25 MED ORDER — QVAR REDIHALER 80 MCG/ACT IN AERB: INHALATION_SPRAY | RESPIRATORY_TRACT | 3 refills | Status: DC

## 2022-01-25 MED ORDER — ATORVASTATIN CALCIUM 40 MG PO TABS: 40.0000 mg | ORAL_TABLET | Freq: Every day | ORAL | 3 refills | Status: DC

## 2022-01-25 MED ORDER — ALBUTEROL SULFATE HFA 108 (90 BASE) MCG/ACT IN AERS: 1.0000 | INHALATION_SPRAY | RESPIRATORY_TRACT | 0 refills | Status: AC | PRN

## 2022-01-25 MED ORDER — MONTELUKAST SODIUM 10 MG PO TABS: ORAL_TABLET | ORAL | 3 refills | Status: DC

## 2022-01-25 MED ORDER — IRBESARTAN 150 MG PO TABS: 150.0000 mg | ORAL_TABLET | Freq: Every day | ORAL | 3 refills | Status: DC

## 2022-01-25 MED ORDER — ESCITALOPRAM OXALATE 10 MG PO TABS: ORAL_TABLET | ORAL | 3 refills | Status: DC

## 2022-01-25 NOTE — Progress Notes (Signed)
Subjective:   By signing my name below, I, Sandy Green, attest that this documentation has been prepared under the direction and in the presence of Sandy Green, 01/25/2022.   Patient ID: Sandy Green, female    DOB: 01/18/1961, 61 y.o.   MRN: [id number]  Chief Complaint  Patient presents with   Hypertension   Follow-up    HPI Patient is in today for an office visit.  Refills Patient is requesting refills on 80 mcg/act Qvar Redihaler, 10 mg Lexapro, 40 mg Lipitor, 10 mg Singulair, 108 mcg/act Albuterol inhaler, and 150 mg Irbesartan.  Asthma Patient reports no problems with Asthma this season. She is compliant with 80 mcg/act Qvar and 10 mg Singulair. Reports that she has not needed albuterol inhaler.  Immunizations She is UTD on vaccines except for recent Covid-19 booster.  Hypertension Patient is compliant with her 150 mg Irbesartan for high blood pressure. She denies any ankle/leg swelling. BP Readings from Last 3 Encounters:  01/25/22 138/82  01/04/22 108/72  06/22/21 (!) 154/102   Pulse Readings from Last 3 Encounters:  01/25/22 64  01/04/22 69  06/22/21 76   Hyperlipidemia Patient is compliant with her 40 mg Lipitor medication for high cholesterol.  There are no preventive care reminders to display for this patient.   Past Medical History:  Diagnosis Date   Allergy    Ankle fracture, left    Anxiety    Asthma    Chicken pox    Elbow fracture    Heart murmur    Hyperlipemia    Hypertension     Past Surgical History:  Procedure Laterality Date   ELBOW FRACTURE SURGERY Left    WISDOM TOOTH EXTRACTION      Family History  Problem Relation Age of Onset   Hypertension Mother    Hypertension Father    Heart disease Father        cabg.6   Diabetes Maternal Grandmother    Cancer Maternal Grandmother        Unsure of kind? Breast   Colon cancer Neg Hx    Esophageal cancer Neg Hx    Rectal cancer Neg Hx    Stomach cancer Neg Hx      Social History   Socioeconomic History   Marital status: Married    Spouse name: Not on file   Number of children: Not on file   Years of education: Not on file   Highest education level: Not on file  Occupational History   Not on file  Tobacco Use   Smoking status: Never   Smokeless tobacco: Never  Vaping Use   Vaping Use: Never used  Substance and Sexual Activity   Alcohol use: Yes    Alcohol/week: 0.0 standard drinks of alcohol    Comment: Rare   Drug use: No   Sexual activity: Yes    Partners: Male  Other Topics Concern   Not on file  Social History Narrative   Exercise-- no    Married,lives with husband.   Unemployed.   Social Determinants of Health   Financial Resource Strain: Not on file  Food Insecurity: Not on file  Transportation Needs: Not on file  Physical Activity: Not on file  Stress: Not on file  Social Connections: Not on file  Intimate Partner Violence: Not on file    Outpatient Medications Prior to Visit  Medication Sig Dispense Refill   Azelastine HCl 137 MCG/SPRAY SOLN USE 1 SPRAY IN EACH NOSTRIL TWICE A  DAY (NEED FURTHER EVALUATION AND/OR LAB TESTING BEFORE FURTHER REFILLS ARE GIVEN, MAKE AN APPOINTMENT) 90 mL 1   carvedilol (COREG) 25 MG tablet TAKE 1 TABLET TWICE A DAY WITH MEALS 180 tablet 1   fluticasone (FLONASE) 50 MCG/ACT nasal spray Place into both nostrils daily.     ipratropium (ATROVENT) 0.06 % nasal spray Place into both nostrils.     Levocetirizine Dihydrochloride (XYZAL ALLERGY 24HR PO) Take 1 tablet by mouth daily.     oxymetazoline (AFRIN NASAL SPRAY) 0.05 % nasal spray Place 1 spray into both nostrils 2 (two) times daily. 30 mL 0   albuterol (VENTOLIN HFA) 108 (90 Base) MCG/ACT inhaler Inhale 1-2 puffs into the lungs every 4 (four) hours as needed for wheezing or shortness of breath. 1 each 0   atorvastatin (LIPITOR) 40 MG tablet Take 1 tablet (40 mg total) by mouth daily. 90 tablet 3   beclomethasone (QVAR REDIHALER) 80  MCG/ACT inhaler USE 2 INHALATIONS TWICE A DAY 3 each 3   escitalopram (LEXAPRO) 10 MG tablet TAKE 1 TABLET DAILY (DUE FOR FOLLOW UP WITH PRIMARY CARE PHYSICIAN) 30 tablet 0   irbesartan (AVAPRO) 150 MG tablet Take 1 tablet (150 mg total) by mouth daily. 90 tablet 3   montelukast (SINGULAIR) 10 MG tablet TAKE 1 TABLET DAILY 30 tablet 0   Facility-Administered Medications Prior to Visit  Medication Dose Route Frequency Provider Last Rate Last Admin   0.9 %  sodium chloride infusion  500 mL Intravenous Continuous Armbruster, Carlota Raspberry, MD        Allergies  Allergen Reactions   Naproxen Other (See Comments)    "tired and dizziness"   Amlodipine Other (See Comments)    "dizziness"   Penicillins Rash and Other (See Comments)    Reaction unknown   Tetracyclines & Related Nausea Only and Rash    GI upset    Review of Systems  Constitutional:  Negative for fever and malaise/fatigue.  HENT:  Negative for congestion.   Eyes:  Negative for blurred vision.  Respiratory:  Negative for shortness of breath.   Cardiovascular:  Negative for chest pain, palpitations and leg swelling.  Gastrointestinal:  Negative for abdominal pain, blood in stool and nausea.  Genitourinary:  Negative for dysuria and frequency.  Musculoskeletal:  Negative for falls.  Skin:  Negative for rash.  Neurological:  Negative for dizziness, loss of consciousness and headaches.  Endo/Heme/Allergies:  Negative for environmental allergies.  Psychiatric/Behavioral:  Negative for depression. The patient is not nervous/anxious.        Objective:    Physical Exam Vitals and nursing note reviewed.  Constitutional:      General: She is not in acute distress.    Appearance: Normal appearance. She is not ill-appearing.  HENT:     Head: Normocephalic and atraumatic.     Right Ear: External ear normal.     Left Ear: External ear normal.  Eyes:     Extraocular Movements: Extraocular movements intact.     Pupils: Pupils are  equal, round, and reactive to light.  Cardiovascular:     Rate and Rhythm: Normal rate and regular rhythm.     Heart sounds: Normal heart sounds. No murmur heard.    No gallop.  Pulmonary:     Effort: Pulmonary effort is normal. No respiratory distress.     Breath sounds: Normal breath sounds. No wheezing or rales.  Skin:    General: Skin is warm and dry.  Neurological:  Mental Status: She is alert and oriented to person, place, and time.  Psychiatric:        Judgment: Judgment normal.     BP 138/82 (BP Location: Left Arm, Patient Position: Sitting, Cuff Size: Normal)   Pulse 64   Temp 97.9 F (36.6 C) (Oral)   Resp 18   Ht '5\' 6"'$  (1.676 m)   Wt 198 lb (89.8 kg)   LMP 04/26/2014 (LMP Unknown)   SpO2 96%   BMI 31.96 kg/m  Wt Readings from Last 3 Encounters:  01/25/22 198 lb (89.8 kg)  01/04/22 194 lb (88 kg)  06/22/21 188 lb (85.3 kg)       Assessment & Plan:   Problem List Items Addressed This Visit       Unprioritized   Anxiety   Relevant Medications   escitalopram (LEXAPRO) 10 MG tablet   Asthma, mild intermittent   Relevant Medications   beclomethasone (QVAR REDIHALER) 80 MCG/ACT inhaler   montelukast (SINGULAIR) 10 MG tablet   albuterol (VENTOLIN HFA) 108 (90 Base) MCG/ACT inhaler   Hyperlipidemia    Encourage heart healthy diet such as MIND or DASH diet, increase exercise, avoid trans fats, simple carbohydrates and processed foods, consider a krill or fish or flaxseed oil cap daily.       Relevant Medications   atorvastatin (LIPITOR) 40 MG tablet   irbesartan (AVAPRO) 150 MG tablet   Other Relevant Orders   CBC with Differential/Platelet   Comprehensive metabolic panel   Lipid panel   Essential hypertension - Primary    Well controlled, no changes to meds. Encouraged heart healthy diet such as the DASH diet and exercise as tolerated.       Relevant Medications   atorvastatin (LIPITOR) 40 MG tablet   irbesartan (AVAPRO) 150 MG tablet   Other  Relevant Orders   CBC with Differential/Platelet   Comprehensive metabolic panel   Lipid panel   Meds ordered this encounter  Medications   beclomethasone (QVAR REDIHALER) 80 MCG/ACT inhaler    Sig: USE 2 INHALATIONS TWICE A DAY    Dispense:  3 each    Refill:  3   atorvastatin (LIPITOR) 40 MG tablet    Sig: Take 1 tablet (40 mg total) by mouth daily.    Dispense:  90 tablet    Refill:  3   escitalopram (LEXAPRO) 10 MG tablet    Sig: 1 po qd    Dispense:  90 tablet    Refill:  3   montelukast (SINGULAIR) 10 MG tablet    Sig: TAKE 1 TABLET DAILY    Dispense:  90 tablet    Refill:  3   irbesartan (AVAPRO) 150 MG tablet    Sig: Take 1 tablet (150 mg total) by mouth daily.    Dispense:  90 tablet    Refill:  3   albuterol (VENTOLIN HFA) 108 (90 Base) MCG/ACT inhaler    Sig: Inhale 1-2 puffs into the lungs every 4 (four) hours as needed for wheezing or shortness of breath.    Dispense:  1 each    Refill:  0    I, Sandy Green, personally preformed the services described in this documentation.  All medical record entries made by the scribe were at my direction and in my presence.  I have reviewed the chart and discharge instructions (if applicable) and agree that the record reflects my personal performance and is accurate and complete. 01/25/2022.   I,Verona Buck,acting as a Education administrator for  Ann Held, DO.,have documented all relevant documentation on the behalf of Ann Held, DO,as directed by  Ann Held, DO while in the presence of Ann Held, DO.    Ann Held, DO

## 2022-01-25 NOTE — Assessment & Plan Note (Signed)
Well controlled, no changes to meds. Encouraged heart healthy diet such as the DASH diet and exercise as tolerated.  °

## 2022-01-25 NOTE — Patient Instructions (Signed)

## 2022-01-25 NOTE — Assessment & Plan Note (Signed)
Encourage heart healthy diet such as MIND or DASH diet, increase exercise, avoid trans fats, simple carbohydrates and processed foods, consider a krill or fish or flaxseed oil cap daily.  °

## 2022-01-26 LAB — COMPREHENSIVE METABOLIC PANEL
ALT: 22 U/L (ref 0–35)
AST: 22 U/L (ref 0–37)
Albumin: 3.6 g/dL (ref 3.5–5.2)
Alkaline Phosphatase: 99 U/L (ref 39–117)
BUN: 21 mg/dL (ref 6–23)
CO2: 30 mEq/L (ref 19–32)
Calcium: 8.8 mg/dL (ref 8.4–10.5)
Chloride: 106 mEq/L (ref 96–112)
Creatinine, Ser: 0.95 mg/dL (ref 0.40–1.20)
GFR: 65.5 mL/min (ref >60.00)
Glucose, Bld: 85 mg/dL (ref 70–99)
Potassium: 4.5 mEq/L (ref 3.5–5.1)
Sodium: 141 mEq/L (ref 135–145)
Total Bilirubin: 1.3 mg/dL — ABNORMAL HIGH (ref 0.2–1.2)
Total Protein: 6.4 g/dL (ref 6.0–8.3)

## 2022-01-26 LAB — CBC WITH DIFFERENTIAL/PLATELET
Basophils Absolute: 0.1 10*3/uL (ref 0.0–0.1)
Basophils Relative: 1.1 % (ref 0.0–3.0)
Eosinophils Absolute: 0.2 10*3/uL (ref 0.0–0.7)
Eosinophils Relative: 2.7 % (ref 0.0–5.0)
HCT: 39 % (ref 36.0–46.0)
Hemoglobin: 12.7 g/dL (ref 12.0–15.0)
Lymphocytes Relative: 30.7 % (ref 12.0–46.0)
Lymphs Abs: 2.6 10*3/uL (ref 0.7–4.0)
MCHC: 32.5 g/dL (ref 30.0–36.0)
MCV: 92.9 fl (ref 78.0–100.0)
Monocytes Absolute: 1 10*3/uL (ref 0.1–1.0)
Monocytes Relative: 11.9 % (ref 3.0–12.0)
Neutro Abs: 4.5 10*3/uL (ref 1.4–7.7)
Neutrophils Relative %: 53.6 % (ref 43.0–77.0)
Platelets: 217 10*3/uL (ref 150.0–400.0)
RBC: 4.2 Mil/uL (ref 3.87–5.11)
RDW: 13.9 % (ref 11.5–15.5)
WBC: 8.5 10*3/uL (ref 4.0–10.5)

## 2022-01-26 LAB — LIPID PANEL
Cholesterol: 150 mg/dL (ref 0–200)
HDL: 62 mg/dL (ref >39.00)
LDL Cholesterol: 73 mg/dL (ref 0–99)
NonHDL: 88.29
Total CHOL/HDL Ratio: 2
Triglycerides: 75 mg/dL (ref 0.0–149.0)
VLDL: 15 mg/dL (ref 0.0–40.0)

## 2022-03-16 ENCOUNTER — Telehealth: Payer: Self-pay | Admitting: Pulmonary Disease

## 2022-03-16 DIAGNOSIS — G4733 Obstructive sleep apnea (adult) (pediatric): Secondary | ICD-10-CM

## 2022-03-16 NOTE — Telephone Encounter (Signed)
Patient called to inform the nurse that the company she was using, [facility], no longer supplies her cpap supplies.  Patient would like a recommendation as to which company she can use.  Please call to discuss.  CB# [phone]

## 2022-03-16 NOTE — Telephone Encounter (Signed)
Called and spoke with patient she states her current DME [employer] is out of her network, she is requesting an order be sent to another company.   I have placed the ordered. Pt has been advised that a new DME company will call her once receiving this order. Pt verbalized understanding. Nothing further needed at this time.

## 2022-03-22 ENCOUNTER — Other Ambulatory Visit (HOSPITAL_BASED_OUTPATIENT_CLINIC_OR_DEPARTMENT_OTHER): Payer: Self-pay

## 2022-04-20 ENCOUNTER — Encounter: Payer: Self-pay | Admitting: Family Medicine

## 2022-04-20 ENCOUNTER — Ambulatory Visit: Payer: Managed Care, Other (non HMO) | Admitting: Family Medicine

## 2022-04-20 VITALS — BP 124/74 | HR 73 | Temp 97.8°F | Resp 16 | Ht 66.0 in | Wt 197.0 lb

## 2022-04-20 DIAGNOSIS — R102 Pelvic and perineal pain: Secondary | ICD-10-CM

## 2022-04-20 DIAGNOSIS — N3000 Acute cystitis without hematuria: Secondary | ICD-10-CM

## 2022-04-20 DIAGNOSIS — R350 Frequency of micturition: Secondary | ICD-10-CM | POA: Diagnosis not present

## 2022-04-20 LAB — POCT URINALYSIS DIPSTICK
Bilirubin, UA: NEGATIVE
Blood, UA: NEGATIVE
Glucose, UA: NEGATIVE
Ketones, UA: NEGATIVE
Nitrite, UA: NEGATIVE
Protein, UA: POSITIVE — AB
Spec Grav, UA: 1.025 (ref 1.010–1.025)
Urobilinogen, UA: 0.2 E.U./dL
pH, UA: 6 (ref 5.0–8.0)

## 2022-04-20 MED ORDER — PHENAZOPYRIDINE HCL 200 MG PO TABS: 200.0000 mg | ORAL_TABLET | Freq: Three times a day (TID) | ORAL | 0 refills | Status: DC | PRN

## 2022-04-20 NOTE — Progress Notes (Signed)
Acute Office Visit  Subjective:     Patient ID: Sandy Green, female    DOB: 17-Dec-1960, 62 y.o.   MRN: [id number]  Chief Complaint  Patient presents with   pain in lower back   Abdominal Pain   Urinary Frequency      Urinary Tract Infection: Patient complains of frequency, suprapubic pressure, and urgency She has had symptoms for 3 days. Patient also complains of mild LLQ/flank discomfort. Patient denies back pain, congestion, cough, fever, headache, rhinitis, sorethroat, stomach ache, and vaginal discharge. Patient does not have a history of recurrent UTI.  Patient does not have a history of pyelonephritis.     All review of systems negative except what is listed in the HPI      Objective:    BP 124/74   Pulse 73   Temp 97.8 F (36.6 C)   Resp 16   Ht '5\' 6"'$  (1.676 m)   Wt 197 lb (89.4 kg)   LMP 04/26/2014 (LMP Unknown)   SpO2 96%   BMI 31.80 kg/m    Physical Exam Vitals reviewed.  Constitutional:      Appearance: Normal appearance.  HENT:     Head: Normocephalic and atraumatic.  Cardiovascular:     Rate and Rhythm: Normal rate and regular rhythm.     Pulses: Normal pulses.     Heart sounds: Normal heart sounds.  Pulmonary:     Effort: Pulmonary effort is normal.     Breath sounds: Normal breath sounds.  Abdominal:     General: Abdomen is flat. Bowel sounds are normal. There is no distension.     Palpations: Abdomen is soft. There is no mass.     Tenderness: There is no abdominal tenderness. There is no right CVA tenderness, left CVA tenderness, guarding or rebound.  Skin:    General: Skin is warm and dry.  Neurological:     Mental Status: She is alert and oriented to person, place, and time.  Psychiatric:        Mood and Affect: Mood normal.        Behavior: Behavior normal.        Thought Content: Thought content normal.        Judgment: Judgment normal.       Results for orders placed or performed in visit on 04/20/22  POC Urinalysis Dipstick   Result Value Ref Range   Color, UA     Clarity, UA     Glucose, UA Negative Negative   Bilirubin, UA Negative    Ketones, UA Negative    Spec Grav, UA 1.025 1.010 - 1.025   Blood, UA Negative    pH, UA 6.0 5.0 - 8.0   Protein, UA Positive (A) Negative   Urobilinogen, UA 0.2 0.2 or 1.0 E.U./dL   Nitrite, UA Negative    Leukocytes, UA Trace (A) Negative   Appearance     Odor          Assessment & Plan:   Problem List Items Addressed This Visit   None Visit Diagnoses     Urinary frequency    -  Primary   Relevant Orders   Urine Culture   POC Urinalysis Dipstick (Completed)   Suprapubic discomfort       Relevant Medications   phenazopyridine (PYRIDIUM) 200 MG tablet   Other Relevant Orders   Urine Culture   POC Urinalysis Dipstick (Completed)      Urine sample with trace leukocytes so we will send  off for culture to see if this is a UTI or just some contamination.  In the meantime, I will give you some pyridium to help with the discomfort. Make sure you are staying well hydrated. You can try cranberry supplements/juice (no added sugars) for symptoms as well.  We will notify you when culture is resulted and any changes to plan.   Please contact office for follow-up if symptoms do not improve or worsen. Seek emergency care if symptoms become severe.    Meds ordered this encounter  Medications   phenazopyridine (PYRIDIUM) 200 MG tablet    Sig: Take 1 tablet (200 mg total) by mouth 3 (three) times daily as needed for pain.    Dispense:  10 tablet    Refill:  0    Order Specific Question:   Supervising Provider    Answer:   Penni Homans A [[id number]]    Return if symptoms worsen or fail to improve.  Terrilyn Saver, NP

## 2022-04-20 NOTE — Patient Instructions (Addendum)
Urine sample with trace leukocytes so we will send off for culture to see if this is a UTI or just some contamination.  In the meantime, I will give you some pyridium to help with the discomfort. Make sure you are staying well hydrated. You can try cranberry supplements/juice (no added sugars) for symptoms as well.  We will notify you when culture is resulted and any changes to plan.   Please contact office for follow-up if symptoms do not improve or worsen. Seek emergency care if symptoms become severe.

## 2022-04-23 LAB — URINE CULTURE
MICRO NUMBER:: 14439040
SPECIMEN QUALITY:: ADEQUATE

## 2022-04-25 MED ORDER — NITROFURANTOIN MONOHYD MACRO 100 MG PO CAPS: 100.0000 mg | ORAL_CAPSULE | Freq: Two times a day (BID) | ORAL | 0 refills | Status: AC

## 2022-04-25 NOTE — Addendum Note (Signed)
Addended by: Caleen Jobs B on: 04/25/2022 08:07 AM   Modules accepted: Orders

## 2022-05-12 ENCOUNTER — Other Ambulatory Visit: Payer: Self-pay

## 2022-06-20 ENCOUNTER — Other Ambulatory Visit: Payer: Self-pay | Admitting: Family Medicine

## 2022-06-20 DIAGNOSIS — I10 Essential (primary) hypertension: Secondary | ICD-10-CM

## 2022-09-01 ENCOUNTER — Other Ambulatory Visit: Payer: Self-pay

## 2022-09-19 ENCOUNTER — Other Ambulatory Visit: Payer: Self-pay | Admitting: Family Medicine

## 2022-09-19 DIAGNOSIS — I10 Essential (primary) hypertension: Secondary | ICD-10-CM

## 2022-10-03 ENCOUNTER — Encounter: Payer: Self-pay | Admitting: Pulmonary Disease

## 2022-10-16 ENCOUNTER — Emergency Department (HOSPITAL_COMMUNITY): Payer: Managed Care, Other (non HMO)

## 2022-10-16 ENCOUNTER — Encounter (HOSPITAL_COMMUNITY): Payer: Self-pay

## 2022-10-16 ENCOUNTER — Other Ambulatory Visit: Payer: Self-pay

## 2022-10-16 ENCOUNTER — Emergency Department (HOSPITAL_COMMUNITY)
Admission: EM | Admit: 2022-10-16 | Discharge: 2022-10-16 | Disposition: A | Discharge status: Home / Self Care | Payer: Managed Care, Other (non HMO) | Attending: Emergency Medicine | Admitting: Emergency Medicine

## 2022-10-16 DIAGNOSIS — S5012XA Contusion of left forearm, initial encounter: Secondary | ICD-10-CM | POA: Insufficient documentation

## 2022-10-16 DIAGNOSIS — S8012XA Contusion of left lower leg, initial encounter: Secondary | ICD-10-CM | POA: Insufficient documentation

## 2022-10-16 DIAGNOSIS — S8010XA Contusion of unspecified lower leg, initial encounter: Secondary | ICD-10-CM

## 2022-10-16 DIAGNOSIS — Y92410 Unspecified street and highway as the place of occurrence of the external cause: Secondary | ICD-10-CM | POA: Insufficient documentation

## 2022-10-16 DIAGNOSIS — S8011XA Contusion of right lower leg, initial encounter: Secondary | ICD-10-CM | POA: Insufficient documentation

## 2022-10-16 MED ORDER — CYCLOBENZAPRINE HCL 5 MG PO TABS: 5.0000 mg | ORAL_TABLET | Freq: Three times a day (TID) | ORAL | 0 refills | Status: DC | PRN
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
## 2022-10-16 NOTE — ED Notes (Signed)
Discharge instructions discussed with pt. Verbalized understanding. VSS. No questions or concerns regarding discharge  
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
## 2022-10-20 ENCOUNTER — Other Ambulatory Visit: Payer: Self-pay | Admitting: Family Medicine

## 2022-10-20 DIAGNOSIS — J4520 Mild intermittent asthma, uncomplicated: Secondary | ICD-10-CM

## 2022-10-27 ENCOUNTER — Ambulatory Visit: Payer: Managed Care, Other (non HMO) | Admitting: Family Medicine

## 2022-10-27 ENCOUNTER — Encounter: Payer: Self-pay | Admitting: Family Medicine

## 2022-10-27 VITALS — BP 110/80 | HR 65 | Temp 97.9°F | Resp 18 | Ht 66.0 in | Wt 187.8 lb

## 2022-10-27 DIAGNOSIS — E785 Hyperlipidemia, unspecified: Secondary | ICD-10-CM | POA: Diagnosis not present

## 2022-10-27 DIAGNOSIS — E2839 Other primary ovarian failure: Secondary | ICD-10-CM

## 2022-10-27 DIAGNOSIS — F419 Anxiety disorder, unspecified: Secondary | ICD-10-CM

## 2022-10-27 DIAGNOSIS — J4520 Mild intermittent asthma, uncomplicated: Secondary | ICD-10-CM

## 2022-10-27 DIAGNOSIS — Z0000 Encounter for general adult medical examination without abnormal findings: Secondary | ICD-10-CM | POA: Diagnosis not present

## 2022-10-27 DIAGNOSIS — I10 Essential (primary) hypertension: Secondary | ICD-10-CM | POA: Diagnosis not present

## 2022-10-27 LAB — CBC WITH DIFFERENTIAL/PLATELET
Basophils Absolute: 0.1 10*3/uL (ref 0.0–0.1)
Basophils Relative: 1.2 % (ref 0.0–3.0)
Eosinophils Absolute: 0.2 10*3/uL (ref 0.0–0.7)
Eosinophils Relative: 2.8 % (ref 0.0–5.0)
HCT: 40.8 % (ref 36.0–46.0)
Hemoglobin: 12.9 g/dL (ref 12.0–15.0)
Lymphocytes Relative: 36 % (ref 12.0–46.0)
Lymphs Abs: 2.2 10*3/uL (ref 0.7–4.0)
MCHC: 31.6 g/dL (ref 30.0–36.0)
MCV: 93.4 fl (ref 78.0–100.0)
Monocytes Absolute: 0.7 10*3/uL (ref 0.1–1.0)
Monocytes Relative: 11 % (ref 3.0–12.0)
Neutro Abs: 3 10*3/uL (ref 1.4–7.7)
Neutrophils Relative %: 49 % (ref 43.0–77.0)
Platelets: 221 10*3/uL (ref 150.0–400.0)
RBC: 4.37 Mil/uL (ref 3.87–5.11)
RDW: 14.6 % (ref 11.5–15.5)
WBC: 6.2 10*3/uL (ref 4.0–10.5)

## 2022-10-27 LAB — COMPREHENSIVE METABOLIC PANEL
ALT: 21 U/L (ref 0–35)
AST: 24 U/L (ref 0–37)
Albumin: 3.6 g/dL (ref 3.5–5.2)
Alkaline Phosphatase: 88 U/L (ref 39–117)
BUN: 14 mg/dL (ref 6–23)
CO2: 31 mEq/L (ref 19–32)
Calcium: 9 mg/dL (ref 8.4–10.5)
Chloride: 105 mEq/L (ref 96–112)
Creatinine, Ser: 1.04 mg/dL (ref 0.40–1.20)
GFR: 58.45 mL/min — ABNORMAL LOW (ref >60.00)
Glucose, Bld: 92 mg/dL (ref 70–99)
Potassium: 4.3 mEq/L (ref 3.5–5.1)
Sodium: 142 mEq/L (ref 135–145)
Total Bilirubin: 1.5 mg/dL — ABNORMAL HIGH (ref 0.2–1.2)
Total Protein: 6.3 g/dL (ref 6.0–8.3)

## 2022-10-27 LAB — TSH: TSH: 1.4 u[IU]/mL (ref 0.35–5.50)

## 2022-10-27 LAB — LIPID PANEL
Cholesterol: 157 mg/dL (ref 0–200)
HDL: 61.1 mg/dL (ref >39.00)
LDL Cholesterol: 82 mg/dL (ref 0–99)
NonHDL: 96.26
Total CHOL/HDL Ratio: 3
Triglycerides: 72 mg/dL (ref 0.0–149.0)
VLDL: 14.4 mg/dL (ref 0.0–40.0)

## 2022-10-27 MED ORDER — ATORVASTATIN CALCIUM 40 MG PO TABS: 40.0000 mg | ORAL_TABLET | Freq: Every day | ORAL | 3 refills | Status: DC

## 2022-10-27 MED ORDER — IRBESARTAN 150 MG PO TABS
150.0000 mg | ORAL_TABLET | Freq: Every day | ORAL | 3 refills | Status: AC
Start: 2022-10-27 — End: ?

## 2022-10-27 MED ORDER — ESCITALOPRAM OXALATE 10 MG PO TABS
ORAL_TABLET | ORAL | 3 refills | Status: AC
Start: 2022-10-27 — End: ?

## 2022-10-27 MED ORDER — MONTELUKAST SODIUM 10 MG PO TABS
ORAL_TABLET | ORAL | 3 refills | Status: AC
Start: 2022-10-27 — End: ?

## 2022-10-27 MED ORDER — QVAR REDIHALER 80 MCG/ACT IN AERB
INHALATION_SPRAY | RESPIRATORY_TRACT | 3 refills | Status: AC
Start: 2022-10-27 — End: ?

## 2022-10-27 MED ORDER — CARVEDILOL 25 MG PO TABS
25.0000 mg | ORAL_TABLET | Freq: Two times a day (BID) | ORAL | 1 refills | Status: AC
Start: 2022-10-27 — End: ?

## 2022-10-27 NOTE — Assessment & Plan Note (Signed)
Ghm utd Check labs  See AVS Health Maintenance  Topic Date Due   COVID-19 Vaccine (7 - 2023-24 season) 12/03/2021   HIV Screening  01/26/2023 (Originally 05/22/1975)   INFLUENZA VACCINE  11/03/2022   MAMMOGRAM  01/13/2023   Colonoscopy  07/19/2023   PAP SMEAR-Modifier  11/18/2023   DTaP/Tdap/Td (3 - Td or Tdap) 07/15/2026   Hepatitis C Screening  Completed   Zoster Vaccines- Shingrix  Completed   HPV VACCINES  Aged Out

## 2022-10-27 NOTE — Assessment & Plan Note (Signed)
Encourage heart healthy diet such as MIND or DASH diet, increase exercise, avoid trans fats, simple carbohydrates and processed foods, consider a krill or fish or flaxseed oil cap daily.  °
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
## 2022-10-27 NOTE — Assessment & Plan Note (Signed)
Well controlled, no changes to meds. Encouraged heart healthy diet such as the DASH diet and exercise as tolerated.  °

## 2022-11-24 ENCOUNTER — Other Ambulatory Visit: Payer: Self-pay | Admitting: Family Medicine

## 2022-11-24 DIAGNOSIS — Z1231 Encounter for screening mammogram for malignant neoplasm of breast: Secondary | ICD-10-CM

## 2022-12-07 ENCOUNTER — Encounter: Payer: Self-pay | Admitting: Pulmonary Disease

## 2022-12-07 ENCOUNTER — Ambulatory Visit: Payer: BC Managed Care – PPO | Admitting: Pulmonary Disease

## 2022-12-07 VITALS — BP 122/82 | HR 66 | Temp 97.3°F | Ht 66.0 in | Wt 190.0 lb

## 2022-12-07 DIAGNOSIS — J4520 Mild intermittent asthma, uncomplicated: Secondary | ICD-10-CM

## 2022-12-07 DIAGNOSIS — G4733 Obstructive sleep apnea (adult) (pediatric): Secondary | ICD-10-CM | POA: Diagnosis not present

## 2022-12-07 NOTE — Patient Instructions (Signed)
Continue using your CPAP on a nightly basis  Continue using your inhalers on a regular basis  Call us with significant concerns  I will see you a year from now

## 2022-12-07 NOTE — Progress Notes (Signed)
Sandy Green    [id number]    [birth year]  Primary Care Physician:Lowne Almeta Monas Grayling Congress, DO  Referring Physician: Zola Button, Grayling Congress, DO [address] [place],  [place] [address]  Chief complaint:   Patient with mild obstructive sleep apnea  HPI: Continues to use CPAP nightly Benefiting from CPAP use Wakes up feeling like he is at a good nights rest  On auto CPAP  History of asthma -On Qvar and compliant with use  Rarely needs rescue inhaler  Usually goes to bed at about 1 AM Wake up time about 9-10 AM Usually wakes up feeling like is that a decent rest  Has been feeling relatively well  Does have a history of hypertension, asthma, hypercholesterolemia  Outpatient Encounter Medications as of 12/07/2022  Medication Sig   albuterol (VENTOLIN HFA) 108 (90 Base) MCG/ACT inhaler Inhale 1-2 puffs into the lungs every 4 (four) hours as needed for wheezing or shortness of breath.   atorvastatin (LIPITOR) 40 MG tablet Take 1 tablet (40 mg total) by mouth daily.   beclomethasone (QVAR REDIHALER) 80 MCG/ACT inhaler USE 2 INHALATIONS TWICE A DAY   carvedilol (COREG) 25 MG tablet Take 1 tablet (25 mg total) by mouth 2 (two) times daily with a meal.   escitalopram (LEXAPRO) 10 MG tablet 1 po qd   fluticasone (FLONASE) 50 MCG/ACT nasal spray Place into both nostrils daily.   irbesartan (AVAPRO) 150 MG tablet Take 1 tablet (150 mg total) by mouth daily.   Levocetirizine Dihydrochloride (XYZAL ALLERGY 24HR PO) Take 1 tablet by mouth daily.   montelukast (SINGULAIR) 10 MG tablet TAKE 1 TABLET DAILY   [DISCONTINUED] cloNIDine (CATAPRES) 0.1 MG tablet TAKE 1 TABLET (0.1 MG TOTAL) BY MOUTH 2 (TWO) TIMES DAILY.   [DISCONTINUED] hydrochlorothiazide (HYDRODIURIL) 25 MG tablet Take 1 tablet (25 mg total) by mouth daily.   Facility-Administered Encounter Medications as of 12/07/2022  Medication   0.9 %  sodium chloride infusion    Allergies as of 12/07/2022 - Review  Complete 12/07/2022  Allergen Reaction Noted   Naproxen Other (See Comments) 07/11/2017   Amlodipine Other (See Comments) 07/11/2017   Penicillins Rash and Other (See Comments) 02/18/2013   Tetracyclines & related Nausea Only and Rash 02/27/2013    Past Medical History:  Diagnosis Date   Allergy    Ankle fracture, left    Anxiety    Asthma    Chicken pox    Elbow fracture    Heart murmur    Hyperlipemia    Hypertension     Past Surgical History:  Procedure Laterality Date   ELBOW FRACTURE SURGERY Left    WISDOM TOOTH EXTRACTION      Family History  Problem Relation Age of Onset   Hypertension Mother    Hypertension Father    Heart disease Father        cabg.6   Diabetes Maternal Grandmother    Cancer Maternal Grandmother        Unsure of kind? Breast   Colon cancer Neg Hx    Esophageal cancer Neg Hx    Rectal cancer Neg Hx    Stomach cancer Neg Hx     Social History   Socioeconomic History   Marital status: Married    Spouse name: Not on file   Number of children: Not on file   Years of education: Not on file   Highest education level: Not on file  Occupational  History   Not on file  Tobacco Use   Smoking status: Never   Smokeless tobacco: Never  Vaping Use   Vaping status: Never Used  Substance and Sexual Activity   Alcohol use: Yes    Alcohol/week: 0.0 standard drinks of alcohol    Comment: Rare   Drug use: No   Sexual activity: Yes    Partners: Male  Other Topics Concern   Not on file  Social History Narrative   Exercise-- no    Married,lives with husband.   Unemployed.   Social Determinants of Health   Financial Resource Strain: Not on file  Food Insecurity: Not on file  Transportation Needs: Not on file  Physical Activity: Not on file  Stress: Not on file  Social Connections: Not on file  Intimate Partner Violence: Not on file   Review of Systems  Constitutional:  Negative for fatigue.  Respiratory:  Positive for apnea. Negative  for shortness of breath.   Psychiatric/Behavioral:  Positive for sleep disturbance.     Vitals:   12/07/22 1342  BP: 122/82  Pulse: 66  Temp: (!) 97.3 F (36.3 C)  SpO2: 97%     Physical Exam Constitutional:      Appearance: She is obese.  HENT:     Head: Normocephalic.     Mouth/Throat:     Mouth: Mucous membranes are moist.  Eyes:     General: No scleral icterus. Cardiovascular:     Rate and Rhythm: Normal rate and regular rhythm.     Heart sounds: No murmur heard.    No friction rub.  Pulmonary:     Effort: No respiratory distress.     Breath sounds: No stridor. No wheezing or rhonchi.  Musculoskeletal:     Cervical back: No rigidity or tenderness.  Neurological:     Mental Status: She is alert.  Psychiatric:        Mood and Affect: Mood normal.    Data Reviewed: Sleep study reviewed with the patient showing an AHI of 12.6, mild oxygen desaturations  Compliance data reviewed showing excellent compliance with CPAP AutoSet 5-15 Residual AHI of 0.4  Assessment:   Mild obstructive sleep apnea with daytime sleepiness -Continues to feel better  Continues to be very compliant with CPAP  Class I obesity -Graded exercise as tolerated  Asthma is well-controlled ACT score of 24  Plan/Recommendations:  I will see you back in a year  Call with significant concerns  Did talk about RSV  She may be able to get RSV vaccination from a pharmacy    Virl Diamond MD [facility] 12/07/2022, 1:49 PM  CC: Donato Schultz, *

## 2022-12-08 ENCOUNTER — Ambulatory Visit
Admission: RE | Admit: 2022-12-08 | Discharge: 2022-12-08 | Disposition: A | Discharge status: Home / Self Care | Payer: BC Managed Care – PPO | Source: Ambulatory Visit | Attending: Family Medicine | Admitting: Family Medicine

## 2022-12-08 DIAGNOSIS — Z1231 Encounter for screening mammogram for malignant neoplasm of breast: Secondary | ICD-10-CM

## 2022-12-30 ENCOUNTER — Encounter: Payer: Self-pay | Admitting: Family Medicine

## 2022-12-30 DIAGNOSIS — I10 Essential (primary) hypertension: Secondary | ICD-10-CM

## 2022-12-30 DIAGNOSIS — J4520 Mild intermittent asthma, uncomplicated: Secondary | ICD-10-CM

## 2022-12-30 DIAGNOSIS — E785 Hyperlipidemia, unspecified: Secondary | ICD-10-CM

## 2022-12-30 DIAGNOSIS — F419 Anxiety disorder, unspecified: Secondary | ICD-10-CM

## 2022-12-30 MED ORDER — ATORVASTATIN CALCIUM 40 MG PO TABS: 40.0000 mg | ORAL_TABLET | Freq: Every day | ORAL | 3 refills | Status: AC

## 2022-12-30 MED ORDER — ESCITALOPRAM OXALATE 10 MG PO TABS: ORAL_TABLET | ORAL | 3 refills | Status: AC

## 2022-12-30 MED ORDER — IRBESARTAN 150 MG PO TABS: 150.0000 mg | ORAL_TABLET | Freq: Every day | ORAL | 3 refills | Status: AC

## 2022-12-30 MED ORDER — QVAR REDIHALER 80 MCG/ACT IN AERB: INHALATION_SPRAY | RESPIRATORY_TRACT | 3 refills | Status: DC

## 2022-12-30 MED ORDER — MONTELUKAST SODIUM 10 MG PO TABS
ORAL_TABLET | ORAL | 3 refills | Status: DC
Start: 2022-12-30 — End: 2023-10-23

## 2022-12-30 MED ORDER — CARVEDILOL 25 MG PO TABS: 25.0000 mg | ORAL_TABLET | Freq: Two times a day (BID) | ORAL | 1 refills | Status: DC

## 2023-01-02 ENCOUNTER — Other Ambulatory Visit: Payer: Self-pay | Admitting: Family Medicine

## 2023-01-02 DIAGNOSIS — E785 Hyperlipidemia, unspecified: Secondary | ICD-10-CM

## 2023-01-02 DIAGNOSIS — J4520 Mild intermittent asthma, uncomplicated: Secondary | ICD-10-CM

## 2023-01-23 ENCOUNTER — Other Ambulatory Visit: Payer: Self-pay

## 2023-01-30 ENCOUNTER — Encounter: Payer: Self-pay | Admitting: Pulmonary Disease

## 2023-01-30 DIAGNOSIS — G4733 Obstructive sleep apnea (adult) (pediatric): Secondary | ICD-10-CM

## 2023-02-01 ENCOUNTER — Other Ambulatory Visit: Payer: Self-pay

## 2023-02-28 ENCOUNTER — Telehealth: Payer: Self-pay | Admitting: Pulmonary Disease

## 2023-02-28 NOTE — Telephone Encounter (Signed)
Chart notes referring pt for sleep study  Fax: [phone]

## 2023-03-10 ENCOUNTER — Other Ambulatory Visit: Payer: Self-pay

## 2023-03-30 ENCOUNTER — Encounter: Payer: Self-pay | Admitting: Family Medicine

## 2023-03-30 DIAGNOSIS — J4520 Mild intermittent asthma, uncomplicated: Secondary | ICD-10-CM

## 2023-03-30 MED ORDER — QVAR REDIHALER 80 MCG/ACT IN AERB
INHALATION_SPRAY | RESPIRATORY_TRACT | 3 refills | Status: AC
Start: 2023-03-30 — End: ?

## 2023-04-07 DIAGNOSIS — M79644 Pain in right finger(s): Secondary | ICD-10-CM | POA: Diagnosis not present

## 2023-04-07 DIAGNOSIS — M1712 Unilateral primary osteoarthritis, left knee: Secondary | ICD-10-CM | POA: Diagnosis not present

## 2023-04-07 DIAGNOSIS — M25532 Pain in left wrist: Secondary | ICD-10-CM | POA: Diagnosis not present

## 2023-04-12 DIAGNOSIS — G4733 Obstructive sleep apnea (adult) (pediatric): Secondary | ICD-10-CM | POA: Diagnosis not present

## 2023-05-12 DIAGNOSIS — G4733 Obstructive sleep apnea (adult) (pediatric): Secondary | ICD-10-CM | POA: Diagnosis not present

## 2023-05-24 ENCOUNTER — Other Ambulatory Visit: Payer: Self-pay | Admitting: Family Medicine

## 2023-05-24 DIAGNOSIS — I10 Essential (primary) hypertension: Secondary | ICD-10-CM

## 2023-06-12 DIAGNOSIS — G4733 Obstructive sleep apnea (adult) (pediatric): Secondary | ICD-10-CM | POA: Diagnosis not present

## 2023-06-21 ENCOUNTER — Encounter: Payer: Self-pay | Admitting: Family Medicine

## 2023-06-21 ENCOUNTER — Ambulatory Visit
Admission: RE | Admit: 2023-06-21 | Discharge: 2023-06-21 | Disposition: A | Discharge status: Home / Self Care | Payer: Managed Care, Other (non HMO) | Source: Ambulatory Visit | Attending: Family Medicine | Admitting: Family Medicine

## 2023-06-21 DIAGNOSIS — M8588 Other specified disorders of bone density and structure, other site: Secondary | ICD-10-CM | POA: Diagnosis not present

## 2023-06-21 DIAGNOSIS — N958 Other specified menopausal and perimenopausal disorders: Secondary | ICD-10-CM | POA: Diagnosis not present

## 2023-06-21 DIAGNOSIS — E2839 Other primary ovarian failure: Secondary | ICD-10-CM | POA: Diagnosis not present

## 2023-06-27 DIAGNOSIS — H00014 Hordeolum externum left upper eyelid: Secondary | ICD-10-CM | POA: Diagnosis not present

## 2023-07-12 ENCOUNTER — Telehealth: Payer: Self-pay

## 2023-07-12 NOTE — Telephone Encounter (Signed)
 Spoke with patient. Pt states there is a recall on Carvedilol. She has not spoken to the pharmacy. Please advise

## 2023-07-12 NOTE — Telephone Encounter (Signed)
No, not yet

## 2023-07-12 NOTE — Telephone Encounter (Signed)
 Copied from CRM [id number]. Topic: Clinical - Medication Question >> Jul 10, 2023  2:49 PM Adrionna Y wrote: Reason for CRM: Patient is calling because she has received a notice that 2 bottles of prescriptions she has are currently being recalled. Patient would like someone to contact her on what the next steps are

## 2023-07-31 ENCOUNTER — Encounter (HOSPITAL_COMMUNITY): Payer: Self-pay

## 2023-07-31 ENCOUNTER — Other Ambulatory Visit: Payer: Self-pay

## 2023-07-31 ENCOUNTER — Ambulatory Visit (HOSPITAL_COMMUNITY)
Admission: RE | Admit: 2023-07-31 | Discharge: 2023-07-31 | Disposition: A | Discharge status: Home / Self Care | Source: Ambulatory Visit | Attending: Family Medicine | Admitting: Family Medicine

## 2023-07-31 VITALS — BP 103/67 | HR 70 | Temp 98.0°F | Resp 18

## 2023-07-31 DIAGNOSIS — J069 Acute upper respiratory infection, unspecified: Secondary | ICD-10-CM | POA: Diagnosis not present

## 2023-07-31 NOTE — Discharge Instructions (Signed)
 Please continue using Flonase , Mucinex as well as adding a humidifier to your room.  Please follow-up if you feel you are not getting better.

## 2023-07-31 NOTE — ED Triage Notes (Signed)
 Patient reports a cough for one week.  Reports phlegm is light yellow and sometimes clear.  Denies runny nose or sneezing.  Pain in bilateral lower ribcage with coughing  Has taken sudafed, cough medicine

## 2023-07-31 NOTE — ED Provider Notes (Signed)
 [facility]    CSN: [id number] Arrival date & time: 07/31/23  1112      History   Chief Complaint Chief Complaint  Patient presents with   Cough    Been coughing for a week. - Entered by patient   Appointment    11:30    HPI Sandy Green is a 63 y.o. female.   Patient presenting with cough this been going on for approximately 1 week.  Patient states that her husband was sick first.  Patient denies any sputum production but does feel like it is more of a dry cough.  Patient denies any congestion at this time.  No other concerns.  Patient does have a history of asthma.   Cough   Past Medical History:  Diagnosis Date   Allergy     Ankle fracture, left    Anxiety    Asthma    Chicken pox    Elbow fracture    Heart murmur    Hyperlipemia    Hypertension     Patient Active Problem List   Diagnosis Date Noted   Strain of calf muscle 12/29/2020   Preventative health care 11/17/2020   Amenorrhea 09/24/2019   Anxiety 05/28/2018   Left wrist pain 09/21/2015   Pain of left heel 09/21/2015   Vaginitis and vulvovaginitis 03/23/2015   Rash and nonspecific skin eruption 01/01/2015   Essential hypertension 06/16/2014   Conjunctivitis 04/09/2014   Hyperlipidemia 02/28/2013   Multiple allergies 02/28/2013   Asthma, mild intermittent 02/28/2013   Obesity (BMI 30-39.9) 02/27/2013    Past Surgical History:  Procedure Laterality Date   ELBOW FRACTURE SURGERY Left    WISDOM TOOTH EXTRACTION      OB History   No obstetric history on file.      Home Medications    Prior to Admission medications   Medication Sig Start Date End Date Taking? Authorizing Provider  albuterol  (VENTOLIN  HFA) 108 (90 Base) MCG/ACT inhaler Inhale 1-2 puffs into the lungs every 4 (four) hours as needed for wheezing or shortness of breath. 01/25/22   Estill Hemming, DO  atorvastatin  (LIPITOR) 40 MG tablet Take 1 tablet (40 mg total) by mouth daily. 12/30/22   Roel Clarity  R, DO  beclomethasone (QVAR  REDIHALER) 80 MCG/ACT inhaler USE 2 INHALATIONS TWICE A DAY 03/30/23   Crecencio Dodge, Candida Chalk, DO  carvedilol  (COREG ) 25 MG tablet Take 1 tablet (25 mg total) by mouth 2 (two) times daily with a meal. 05/24/23   Crecencio Dodge, Candida Chalk, DO  escitalopram  (LEXAPRO ) 10 MG tablet 1 po qd 12/30/22   Lowne Chase, Yvonne R, DO  fluticasone  (FLONASE ) 50 MCG/ACT nasal spray Place into both nostrils daily.    [provider]  irbesartan  (AVAPRO ) 150 MG tablet Take 1 tablet (150 mg total) by mouth daily. 12/30/22   Lowne Chase, Yvonne R, DO  Levocetirizine Dihydrochloride (XYZAL ALLERGY  24HR PO) Take 1 tablet by mouth daily.    [provider]  montelukast  (SINGULAIR ) 10 MG tablet TAKE 1 TABLET DAILY 12/30/22   Crecencio Dodge, Yvonne R, DO  cloNIDine  (CATAPRES ) 0.1 MG tablet TAKE 1 TABLET (0.1 MG TOTAL) BY MOUTH 2 (TWO) TIMES DAILY. 07/22/19 09/04/19  Estill Hemming, DO  hydrochlorothiazide  (HYDRODIURIL ) 25 MG tablet Take 1 tablet (25 mg total) by mouth daily. 06/14/19 09/04/19  Estill Hemming, DO    Family History Family History  Problem Relation Age of Onset   Hypertension Mother  Hypertension Father    Heart disease Father        cabg.6   Diabetes Maternal Grandmother    Cancer Maternal Grandmother        Unsure of kind? Breast   Colon cancer Neg Hx    Esophageal cancer Neg Hx    Rectal cancer Neg Hx    Stomach cancer Neg Hx     Social History Social History   Tobacco Use   Smoking status: Never   Smokeless tobacco: Never  Vaping Use   Vaping status: Never Used  Substance Use Topics   Alcohol use: Yes    Alcohol/week: 0.0 standard drinks of alcohol    Comment: Rare   Drug use: No     Allergies   Naproxen , Amlodipine , Penicillins, and Tetracyclines & related   Review of Systems Review of Systems  Respiratory:  Positive for cough.      Physical Exam Triage Vital Signs ED Triage Vitals  Encounter Vitals Group     BP 07/31/23  1224 98/66     Systolic BP Percentile --      Diastolic BP Percentile --      Pulse Rate 07/31/23 1224 70     Resp 07/31/23 1224 18     Temp 07/31/23 1224 98 F (36.7 C)     Temp Source 07/31/23 1224 Oral     SpO2 07/31/23 1224 94 %     Weight --      Height --      Head Circumference --      Peak Flow --      Pain Score 07/31/23 1221 4     Pain Loc --      Pain Education --      Exclude from Growth Chart --    No data found.  Updated Vital Signs BP 103/67 (BP Location: Left Arm)   Pulse 70   Temp 98 F (36.7 C) (Oral)   Resp 18   LMP 04/26/2014 (LMP Unknown)   SpO2 94%   Visual Acuity Right Eye Distance:   Left Eye Distance:   Bilateral Distance:    Right Eye Near:   Left Eye Near:    Bilateral Near:     Physical Exam Constitutional:      Appearance: Normal appearance.  HENT:     Nose: No congestion or rhinorrhea.     Mouth/Throat:     Pharynx: Posterior oropharyngeal erythema present.  Cardiovascular:     Rate and Rhythm: Normal rate and regular rhythm.     Pulses: Normal pulses.  Pulmonary:     Effort: Pulmonary effort is normal. No respiratory distress.     Breath sounds: No wheezing.  Neurological:     Mental Status: She is alert.      UC Treatments / Results  Labs (all labs ordered are listed, but only abnormal results are displayed) Labs Reviewed - No data to display  EKG   Radiology No results found.  Procedures Procedures (including critical care time)  Medications Ordered in UC Medications - No data to display  Initial Impression / Assessment and Plan / UC Course  I have reviewed the triage vital signs and the nursing notes.  Pertinent labs & imaging results that were available during my care of the patient were reviewed by me and considered in my medical decision making (see chart for details).     Do suspect patient is likely improving from a viral URI.  Discussed with patient that  she should continue doing what she is doing  and add a humidifier in her room.  Patient is understanding and agreeable.  No concern for any bacterial infection at this time. Final Clinical Impressions(s) / UC Diagnoses   Final diagnoses:  Viral URI with cough     Discharge Instructions      Please continue using Flonase , Mucinex as well as adding a humidifier to your room.  Please follow-up if you feel you are not getting better.     ED Prescriptions   None    PDMP not reviewed this encounter.   Jude Norton, MD 07/31/23 1249

## 2023-08-04 ENCOUNTER — Encounter: Payer: Self-pay | Admitting: Gastroenterology

## 2023-08-12 ENCOUNTER — Other Ambulatory Visit: Payer: Self-pay | Admitting: Family Medicine

## 2023-08-12 DIAGNOSIS — I10 Essential (primary) hypertension: Secondary | ICD-10-CM

## 2023-08-15 DIAGNOSIS — J4520 Mild intermittent asthma, uncomplicated: Secondary | ICD-10-CM | POA: Diagnosis not present

## 2023-08-15 DIAGNOSIS — I10 Essential (primary) hypertension: Secondary | ICD-10-CM | POA: Diagnosis not present

## 2023-08-16 ENCOUNTER — Encounter: Payer: Self-pay | Admitting: Gastroenterology

## 2023-09-19 DIAGNOSIS — G4733 Obstructive sleep apnea (adult) (pediatric): Secondary | ICD-10-CM | POA: Diagnosis not present

## 2023-10-04 ENCOUNTER — Encounter: Payer: Self-pay | Admitting: Gastroenterology

## 2023-10-04 ENCOUNTER — Ambulatory Visit (AMBULATORY_SURGERY_CENTER): Admitting: *Deleted

## 2023-10-04 VITALS — Ht 64.0 in | Wt 190.0 lb

## 2023-10-04 DIAGNOSIS — Z860100 Personal history of colon polyps, unspecified: Secondary | ICD-10-CM

## 2023-10-04 MED ORDER — NA SULFATE-K SULFATE-MG SULF 17.5-3.13-1.6 GM/177ML PO SOLN
1.0000 | Freq: Once | ORAL | 0 refills | Status: AC
Start: 2023-10-04 — End: 2023-10-04

## 2023-10-04 NOTE — Progress Notes (Signed)
 Pt's name and DOB verified at the beginning of the pre-visit wit 2 identifiers  Pt denies any difficulty with ambulating,sitting, laying down or rolling side to side  Pt has no issues moving head neck or swallowing  No egg or soy allergy  known to patient   No issues known to pt with past sedation with any surgeries or procedures  No FH of Malignant Hyperthermia  Pt is not on home 02   Pt is not on blood thinners   Pt denies issues with constipation   Pt has frequent issues with constipation RN instructed pt to use Miralax per bottles instructions a week before prep days. Pt states they will  Pt is not on dialysis  Pt denise any abnormal heart rhythms   Pt denies any upcoming cardiac testing  Patient's chart reviewed by Norleen Schillings CNRA prior to pre-visit and patient appropriate for the [facility].  Pre-visit completed and red dot placed by patient's name on their procedure day (on provider's schedule).   Visit by phone  Pt states weight is 190  IInstructions reviewed. Pt given  both [facility] main # and MD on call # prior to instructions.  Pt states understanding of instructions. Instructed pt to review instructions again prior to procedure and call main # given if has questions.. Pt states they will.   Instructed pt on where to find instructions on My Chart.

## 2023-10-20 ENCOUNTER — Other Ambulatory Visit: Payer: Self-pay | Admitting: Family Medicine

## 2023-10-20 DIAGNOSIS — F419 Anxiety disorder, unspecified: Secondary | ICD-10-CM

## 2023-10-20 DIAGNOSIS — I10 Essential (primary) hypertension: Secondary | ICD-10-CM

## 2023-10-23 ENCOUNTER — Other Ambulatory Visit: Payer: Self-pay | Admitting: Family Medicine

## 2023-10-23 DIAGNOSIS — J4520 Mild intermittent asthma, uncomplicated: Secondary | ICD-10-CM

## 2023-10-27 ENCOUNTER — Ambulatory Visit: Admitting: Gastroenterology

## 2023-10-27 ENCOUNTER — Encounter: Payer: Self-pay | Admitting: Gastroenterology

## 2023-10-27 VITALS — BP 144/78 | HR 60 | Temp 97.3°F | Resp 15 | Ht 64.0 in | Wt 190.0 lb

## 2023-10-27 DIAGNOSIS — K648 Other hemorrhoids: Secondary | ICD-10-CM

## 2023-10-27 DIAGNOSIS — D120 Benign neoplasm of cecum: Secondary | ICD-10-CM | POA: Diagnosis not present

## 2023-10-27 DIAGNOSIS — K5730 Diverticulosis of large intestine without perforation or abscess without bleeding: Secondary | ICD-10-CM | POA: Diagnosis not present

## 2023-10-27 DIAGNOSIS — Z860100 Personal history of colon polyps, unspecified: Secondary | ICD-10-CM

## 2023-10-27 DIAGNOSIS — Z1211 Encounter for screening for malignant neoplasm of colon: Secondary | ICD-10-CM

## 2023-10-27 DIAGNOSIS — D128 Benign neoplasm of rectum: Secondary | ICD-10-CM | POA: Diagnosis not present

## 2023-10-27 MED ORDER — SODIUM CHLORIDE 0.9 % IV SOLN: 500.0000 mL | INTRAVENOUS | Status: DC

## 2023-10-27 NOTE — Op Note (Signed)
 [facility] Patient Name: Sandy Green Procedure Date: 10/27/2023 10:15 AM MRN: [id number] Endoscopist: Elspeth P. Leigh , MD, [id number] Age: 63 Referring MD:  Date of Birth: June 25, 1960 Gender: Female Account #: [id number] Procedure:                Colonoscopy Indications:              High risk colon cancer surveillance: Personal                            history of colonic polyps - adenoma removed 07/2016 Medicines:                Monitored Anesthesia Care Procedure:                Pre-Anesthesia Assessment:                           - Prior to the procedure, a History and Physical                            was performed, and patient medications and                            allergies were reviewed. The patient's tolerance of                            previous anesthesia was also reviewed. The risks                            and benefits of the procedure and the sedation                            options and risks were discussed with the patient.                            All questions were answered, and informed consent                            was obtained. Prior Anticoagulants: The patient has                            taken no anticoagulant or antiplatelet agents. ASA                            Grade Assessment: III - A patient with severe                            systemic disease. After reviewing the risks and                            benefits, the patient was deemed in satisfactory                            condition to undergo the procedure.  After obtaining informed consent, the colonoscope                            was passed under direct vision. Throughout the                            procedure, the patient's blood pressure, pulse, and                            oxygen saturations were monitored continuously. The                            Olympus Scope SN: [id number] was introduced through                            the  anus and advanced to the the cecum, identified                            by appendiceal orifice and ileocecal valve. The                            colonoscopy was performed without difficulty. The                            patient tolerated the procedure well. The quality                            of the bowel preparation was good. The ileocecal                            valve, appendiceal orifice, and rectum were                            photographed. Scope In: 10:32:19 AM Scope Out: 10:45:50 AM Scope Withdrawal Time: 0 hours 11 minutes 15 seconds  Total Procedure Duration: 0 hours 13 minutes 31 seconds  Findings:                 The perianal and digital rectal examinations were                            normal.                           A 2 to 3 mm polyp was found in the cecum. The polyp                            was sessile. The polyp was removed with a cold                            snare. Resection and retrieval were complete.                           Multiple small-mouthed diverticula were found in  the entire colon.                           A diminutive polyp was found in the distal rectum.                            The polyp was sessile and approximated the dentate                            line. The polyp was removed with a cold biopsy                            forceps. Resection and retrieval were complete.                           Internal hemorrhoids were found during                            retroflexion. The hemorrhoids were small.                           The exam was otherwise without abnormality. Complications:            No immediate complications. Estimated blood loss:                            Minimal. Estimated Blood Loss:     Estimated blood loss was minimal. Impression:               - One 2 to 3 mm polyp in the cecum, removed with a                            cold snare. Resected and retrieved.                            - Diverticulosis in the entire examined colon.                           - One diminutive polyp in the distal rectum,                            removed with a cold biopsy forceps. Resected and                            retrieved.                           - Internal hemorrhoids.                           - The examination was otherwise normal. Recommendation:           - Patient has a contact number available for                            emergencies. The signs and symptoms of potential  delayed complications were discussed with the                            patient. Return to normal activities tomorrow.                            Written discharge instructions were provided to the                            patient.                           - Resume previous diet.                           - Continue present medications.                           - Await pathology results. Elspeth P. Leigh, MD 10/27/2023 10:50:28 AM This report has been signed electronically.

## 2023-10-27 NOTE — Progress Notes (Signed)
 Pt sedate, gd SR's, VSS, report to RN

## 2023-10-27 NOTE — Progress Notes (Signed)
 Pt's states no medical or surgical changes since previsit or office visit.

## 2023-10-27 NOTE — Progress Notes (Signed)
 [facility] History and Physical   Primary Care Physician:  Stephane Leita DEL, MD   Reason for Procedure:   History of colon polyps  Plan:    colonoscopy     HPI: Sandy Green is a 63 y.o. female  here for colonoscopy surveillance - last exam 07/2016 - one adenoma.   Patient denies any bowel symptoms at this time. No family history of colon cancer known. Otherwise feels well without any cardiopulmonary symptoms.   I have discussed risks / benefits of anesthesia and endoscopic procedure with Candance Daring and they wish to proceed with the exams as outlined today.    Past Medical History:  Diagnosis Date   Allergy     Ankle fracture, left    Anxiety    Asthma    Chicken pox    Elbow fracture    Heart murmur    Hyperlipemia    Hypertension    Osteopenia    Sleep apnea     Past Surgical History:  Procedure Laterality Date   COLONOSCOPY     ELBOW FRACTURE SURGERY Left    WISDOM TOOTH EXTRACTION      Prior to Admission medications   Medication Sig Start Date End Date Taking? Authorizing Provider  atorvastatin  (LIPITOR) 40 MG tablet Take 1 tablet (40 mg total) by mouth daily. 12/30/22  Yes Antonio Cyndee Rockers R, DO  beclomethasone (QVAR  REDIHALER) 80 MCG/ACT inhaler USE 2 INHALATIONS TWICE A DAY 03/30/23  Yes Antonio Cyndee Rockers R, DO  carvedilol  (COREG ) 25 MG tablet Take 1 tablet (25 mg total) by mouth 2 (two) times daily with a meal. Pt needs office visit for further refills 08/14/23  Yes Antonio Cyndee, Rockers R, DO  escitalopram  (LEXAPRO ) 10 MG tablet 1 po qd 12/30/22  Yes Lowne Chase, Yvonne R, DO  fluticasone  (FLONASE ) 50 MCG/ACT nasal spray Place into both nostrils daily.   Yes [provider]  ipratropium (ATROVENT ) 0.06 % nasal spray 2 sprays in each nostril Nasally Twice a day   Yes [provider]  irbesartan  (AVAPRO ) 150 MG tablet Take 1 tablet (150 mg total) by mouth daily. 12/30/22  Yes Antonio Cyndee Rockers JONELLE, DO  Levocetirizine Dihydrochloride  (XYZAL ALLERGY  24HR PO) Take 1 tablet by mouth daily.   Yes [provider]  montelukast  (SINGULAIR ) 10 MG tablet TAKE ONE TABLET BY MOUTH EVERY DAY 10/23/23  Yes Lowne Chase, Yvonne R, DO  albuterol  (VENTOLIN  HFA) 108 (90 Base) MCG/ACT inhaler Inhale 1-2 puffs into the lungs every 4 (four) hours as needed for wheezing or shortness of breath. 01/25/22   Antonio Cyndee Rockers JONELLE, DO  Calcium  Carb-Cholecalciferol (CALCIUM  + VITAMIN D3 PO) Calcium -Vitamin D3    [provider]  Multiple Vitamin (MULTIVITAMIN ADULT PO) Take by mouth.    [provider]  Multiple Vitamins-Minerals (OCUVITE ADULT 50+) CAPS as directed Orally    [provider]  cloNIDine  (CATAPRES ) 0.1 MG tablet TAKE 1 TABLET (0.1 MG TOTAL) BY MOUTH 2 (TWO) TIMES DAILY. 07/22/19 09/04/19  Antonio Cyndee Rockers JONELLE, DO  hydrochlorothiazide  (HYDRODIURIL ) 25 MG tablet Take 1 tablet (25 mg total) by mouth daily. 06/14/19 09/04/19  Antonio Cyndee Rockers JONELLE, DO    Current Outpatient Medications  Medication Sig Dispense Refill   atorvastatin  (LIPITOR) 40 MG tablet Take 1 tablet (40 mg total) by mouth daily. 90 tablet 3   beclomethasone (QVAR  REDIHALER) 80 MCG/ACT inhaler USE 2 INHALATIONS TWICE A DAY 3 each 3   carvedilol  (COREG ) 25 MG tablet Take  1 tablet (25 mg total) by mouth 2 (two) times daily with a meal. Pt needs office visit for further refills 180 tablet 0   escitalopram  (LEXAPRO ) 10 MG tablet 1 po qd 90 tablet 3   fluticasone  (FLONASE ) 50 MCG/ACT nasal spray Place into both nostrils daily.     ipratropium (ATROVENT ) 0.06 % nasal spray 2 sprays in each nostril Nasally Twice a day     irbesartan  (AVAPRO ) 150 MG tablet Take 1 tablet (150 mg total) by mouth daily. 90 tablet 3   Levocetirizine Dihydrochloride (XYZAL ALLERGY  24HR PO) Take 1 tablet by mouth daily.     montelukast  (SINGULAIR ) 10 MG tablet TAKE ONE TABLET BY MOUTH EVERY DAY 90 tablet 3   albuterol  (VENTOLIN  HFA) 108 (90 Base) MCG/ACT inhaler Inhale 1-2  puffs into the lungs every 4 (four) hours as needed for wheezing or shortness of breath. 1 each 0   Calcium  Carb-Cholecalciferol (CALCIUM  + VITAMIN D3 PO) Calcium -Vitamin D3     Multiple Vitamin (MULTIVITAMIN ADULT PO) Take by mouth.     Multiple Vitamins-Minerals (OCUVITE ADULT 50+) CAPS as directed Orally     Current Facility-Administered Medications  Medication Dose Route Frequency Provider Last Rate Last Admin   0.9 %  sodium chloride  infusion  500 mL Intravenous Continuous Haji Delaine, Elspeth SQUIBB, MD        Allergies as of 10/27/2023 - Review Complete 10/27/2023  Allergen Reaction Noted   Naproxen  Other (See Comments) 07/11/2017   Amlodipine  Other (See Comments) 07/11/2017   Penicillins Rash and Other (See Comments) 02/18/2013   Tetracyclines & related Nausea Only and Rash 02/27/2013    Family History  Problem Relation Age of Onset   Hypertension Mother    Hypertension Father    Heart disease Father        cabg.6   Diabetes Maternal Grandmother    Cancer Maternal Grandmother        Unsure of kind? Breast   Colon cancer Neg Hx    Esophageal cancer Neg Hx    Rectal cancer Neg Hx    Stomach cancer Neg Hx    Colon polyps Neg Hx     Social History   Socioeconomic History   Marital status: Married    Spouse name: Not on file   Number of children: Not on file   Years of education: Not on file   Highest education level: Not on file  Occupational History   Not on file  Tobacco Use   Smoking status: Never   Smokeless tobacco: Never  Vaping Use   Vaping status: Never Used  Substance and Sexual Activity   Alcohol use: Yes    Alcohol/week: 0.0 standard drinks of alcohol    Comment: Rare   Drug use: No   Sexual activity: Yes    Partners: Male    Birth control/protection: Post-menopausal  Other Topics Concern   Not on file  Social History Narrative   Exercise-- no    Married,lives with husband.   Unemployed.   Social Drivers of Corporate investment banker Strain:  Not on file  Food Insecurity: Not on file  Transportation Needs: Not on file  Physical Activity: Not on file  Stress: Not on file  Social Connections: Not on file  Intimate Partner Violence: Not on file    Review of Systems: All other review of systems negative except as mentioned in the HPI.  Physical Exam: Vital signs BP (!) 135/98   Pulse 72   Temp (!)  97.3 F (36.3 C) (Temporal)   Ht 5' 4 (1.626 m)   Wt 190 lb (86.2 kg)   LMP 04/26/2014 (LMP Unknown)   SpO2 95%   BMI 32.61 kg/m   General:   Alert,  Well-developed, pleasant and cooperative in NAD Lungs:  Clear throughout to auscultation.   Heart:  Regular rate and rhythm Abdomen:  Soft, nontender and nondistended.   Neuro/Psych:  Alert and cooperative. Normal mood and affect. A and O x 3  Marcey Naval, MD [facility]

## 2023-10-27 NOTE — Progress Notes (Signed)
 Called to room to assist during endoscopic procedure.  Patient ID and intended procedure confirmed with present staff. Received instructions for my participation in the procedure from the performing physician.

## 2023-10-27 NOTE — Patient Instructions (Addendum)
-  Handout on polyps, diverticulosis and hemorrhoids provided -Await pathology results  YOU HAD AN ENDOSCOPIC PROCEDURE TODAY AT THE [facility]:   Refer to the procedure report that was given to you for any specific questions about what was found during the examination.  If the procedure report does not answer your questions, please call your gastroenterologist to clarify.  If you requested that your care partner not be given the details of your procedure findings, then the procedure report has been included in a sealed envelope for you to review at your convenience later.  YOU SHOULD EXPECT: Some feelings of bloating in the abdomen. Passage of more gas than usual.  Walking can help get rid of the air that was put into your GI tract during the procedure and reduce the bloating. If you had a lower endoscopy (such as a colonoscopy or flexible sigmoidoscopy) you may notice spotting of blood in your stool or on the toilet paper. If you underwent a bowel prep for your procedure, you may not have a normal bowel movement for a few days.  Please Note:  You might notice some irritation and congestion in your nose or some drainage.  This is from the oxygen used during your procedure.  There is no need for concern and it should clear up in a day or so.  SYMPTOMS TO REPORT IMMEDIATELY:  Following lower endoscopy (colonoscopy or flexible sigmoidoscopy):  Excessive amounts of blood in the stool  Significant tenderness or worsening of abdominal pains  Swelling of the abdomen that is new, acute  Fever of 100F or higher  For urgent or emergent issues, a gastroenterologist can be reached at any hour by calling [phone]. Do not use MyChart messaging for urgent concerns.    DIET:  We do recommend a small meal at first, but then you may proceed to your regular diet.  Drink plenty of fluids but you should avoid alcoholic beverages for 24 hours.  ACTIVITY:  You should plan to take it easy for the  rest of today and you should NOT DRIVE or use heavy machinery until tomorrow (because of the sedation medicines used during the test).    FOLLOW UP: Our staff will call the number listed on your records the next business day following your procedure.  We will call around 7:15- 8:00 am to check on you and address any questions or concerns that you may have regarding the information given to you following your procedure. If we do not reach you, we will leave a message.     If any biopsies were taken you will be contacted by phone or by letter within the next 1-3 weeks.  Please call us  at [phone] if you have not heard about the biopsies in 3 weeks.    SIGNATURES/CONFIDENTIALITY: You and/or your care partner have signed paperwork which will be entered into your electronic medical record.  These signatures attest to the fact that that the information above on your After Visit Summary has been reviewed and is understood.  Full responsibility of the confidentiality of this discharge information lies with you and/or your care-partner.

## 2023-10-30 ENCOUNTER — Telehealth: Payer: Self-pay

## 2023-10-30 NOTE — Telephone Encounter (Signed)
 Left message on answering machine.

## 2023-10-31 ENCOUNTER — Ambulatory Visit: Payer: Self-pay | Admitting: Gastroenterology

## 2023-10-31 LAB — SURGICAL PATHOLOGY

## 2023-11-13 DIAGNOSIS — I10 Essential (primary) hypertension: Secondary | ICD-10-CM | POA: Diagnosis not present

## 2023-11-13 DIAGNOSIS — M85851 Other specified disorders of bone density and structure, right thigh: Secondary | ICD-10-CM | POA: Diagnosis not present

## 2023-11-14 ENCOUNTER — Encounter: Payer: Self-pay | Admitting: Pulmonary Disease

## 2023-11-30 ENCOUNTER — Ambulatory Visit (INDEPENDENT_AMBULATORY_CARE_PROVIDER_SITE_OTHER): Admitting: Pulmonary Disease

## 2023-11-30 ENCOUNTER — Telehealth: Payer: Self-pay

## 2023-11-30 VITALS — BP 106/66 | HR 67 | Ht 65.0 in | Wt 189.0 lb

## 2023-11-30 DIAGNOSIS — G4733 Obstructive sleep apnea (adult) (pediatric): Secondary | ICD-10-CM | POA: Diagnosis not present

## 2023-11-30 NOTE — Telephone Encounter (Signed)
 DME companies do their own [employer]

## 2023-11-30 NOTE — Progress Notes (Signed)
 Sandy Green    [id number]    [birth year]  Primary Care Physician:Wile, Sandy DEL, MD  Referring Physician: Antonio Green, Sandy SAUNDERS, DO [address] [place],  [place] [address]  Chief complaint:   Patient with mild obstructive sleep apnea  HPI: Continues on CPAP nightly Benefiting from CPAP use Waking up feeling like she has had a good nights rest  Occasionally will feel sleepy during the day  History of asthma  Well-controlled symptoms  Sleep habits have not really changed much  Usually goes to bed at about 1 AM Wake up time about 9-10 AM Usually wakes up feeling like is that a decent rest  Has been feeling relatively well  Does have a history of hypertension, asthma, hypercholesterolemia  Outpatient Encounter Medications as of 11/30/2023  Medication Sig   albuterol  (VENTOLIN  HFA) 108 (90 Base) MCG/ACT inhaler Inhale 1-2 puffs into the lungs every 4 (four) hours as needed for wheezing or shortness of breath.   atorvastatin  (LIPITOR) 40 MG tablet Take 1 tablet (40 mg total) by mouth daily.   beclomethasone (QVAR  REDIHALER) 80 MCG/ACT inhaler USE 2 INHALATIONS TWICE A DAY   Calcium  Carb-Cholecalciferol (CALCIUM  + VITAMIN D3 PO) Calcium -Vitamin D3   carvedilol  (COREG ) 25 MG tablet Take 1 tablet (25 mg total) by mouth 2 (two) times daily with a meal. Pt needs office visit for further refills   escitalopram  (LEXAPRO ) 10 MG tablet 1 po qd   fluticasone  (FLONASE ) 50 MCG/ACT nasal spray Place into both nostrils daily.   ipratropium (ATROVENT ) 0.06 % nasal spray 2 sprays in each nostril Nasally Twice a day   irbesartan  (AVAPRO ) 150 MG tablet Take 1 tablet (150 mg total) by mouth daily.   Levocetirizine Dihydrochloride (XYZAL ALLERGY  24HR PO) Take 1 tablet by mouth daily.   montelukast  (SINGULAIR ) 10 MG tablet TAKE ONE TABLET BY MOUTH EVERY DAY   Multiple Vitamin (MULTIVITAMIN ADULT PO) Take by mouth.   Multiple Vitamins-Minerals (OCUVITE  ADULT 50+) CAPS as directed Orally   [DISCONTINUED] cloNIDine  (CATAPRES ) 0.1 MG tablet TAKE 1 TABLET (0.1 MG TOTAL) BY MOUTH 2 (TWO) TIMES DAILY.   [DISCONTINUED] hydrochlorothiazide  (HYDRODIURIL ) 25 MG tablet Take 1 tablet (25 mg total) by mouth daily.   No facility-administered encounter medications on file as of 11/30/2023.    Allergies as of 11/30/2023 - Review Complete 10/27/2023  Allergen Reaction Noted   Naproxen  Other (See Comments) 07/11/2017   Amlodipine  Other (See Comments) 07/11/2017   Penicillins Rash and Other (See Comments) 02/18/2013   Tetracyclines & related Nausea Only and Rash 02/27/2013    Past Medical History:  Diagnosis Date   Allergy     Ankle fracture, left    Anxiety    Asthma    Chicken pox    Elbow fracture    Heart murmur    Hyperlipemia    Hypertension    Osteopenia    Sleep apnea     Past Surgical History:  Procedure Laterality Date   COLONOSCOPY     ELBOW FRACTURE SURGERY Left    WISDOM TOOTH EXTRACTION      Family History  Problem Relation Age of Onset   Hypertension Mother    Hypertension Father    Heart disease Father        cabg.6   Diabetes Maternal Grandmother    Cancer Maternal Grandmother        Unsure of kind? Breast  Colon cancer Neg Hx    Esophageal cancer Neg Hx    Rectal cancer Neg Hx    Stomach cancer Neg Hx    Colon polyps Neg Hx     Social History   Socioeconomic History   Marital status: Married    Spouse name: Not on file   Number of children: Not on file   Years of education: Not on file   Highest education level: Not on file  Occupational History   Not on file  Tobacco Use   Smoking status: Never   Smokeless tobacco: Never  Vaping Use   Vaping status: Never Used  Substance and Sexual Activity   Alcohol use: Yes    Alcohol/week: 0.0 standard drinks of alcohol    Comment: Rare   Drug use: No   Sexual activity: Yes    Partners: Male    Birth control/protection: Post-menopausal  Other Topics  Concern   Not on file  Social History Narrative   Exercise-- no    Married,lives with husband.   Unemployed.   Social Drivers of Corporate investment banker Strain: Not on file  Food Insecurity: Not on file  Transportation Needs: Not on file  Physical Activity: Not on file  Stress: Not on file  Social Connections: Not on file  Intimate Partner Violence: Not on file   Review of Systems  Constitutional:  Negative for fatigue.  Respiratory:  Positive for apnea. Negative for shortness of breath.   Psychiatric/Behavioral:  Positive for sleep disturbance.     Vitals:   11/30/23 1324  BP: 106/66  Pulse: 67  SpO2: 96%     Physical Exam Constitutional:      Appearance: She is obese.  HENT:     Head: Normocephalic.     Mouth/Throat:     Mouth: Mucous membranes are moist.  Eyes:     General: No scleral icterus. Cardiovascular:     Rate and Rhythm: Normal rate and regular rhythm.     Heart sounds: No murmur heard.    No friction rub.  Pulmonary:     Effort: No respiratory distress.     Breath sounds: No stridor. No wheezing or rhonchi.  Musculoskeletal:     Cervical back: No rigidity or tenderness.  Neurological:     Mental Status: She is alert.  Psychiatric:        Mood and Affect: Mood normal.    Data Reviewed: Sleep study reviewed with the patient showing an AHI of 12.6, mild oxygen desaturations  Compliance data reviewed showing excellent compliance with CPAP Average use of 6 hours 46 minutes AutoSet 5-15 Residual AHI of 0.5 Assessment:   Mild obstructive sleep apnea with daytime sleepiness -Continues to tolerate CPAP well Using CPAP nightly Waking up feeling like she has had a good nights rest  Class I obesity Continues to work on weight loss efforts  History of asthma - Symptoms appear well-controlled  Plan/Recommendations:  Follow-up in about a year from now  Continue using CPAP on a nightly basis  Call with any significant  concerns     Sandy Epley MD [facility] 11/30/2023, 1:30 PM  CC: Sandy Cyndee Sandy Sandy Green, *

## 2023-11-30 NOTE — Telephone Encounter (Signed)
FYI I guess..

## 2023-11-30 NOTE — Patient Instructions (Signed)
 Follow-up a year from now  Continue using your CPAP on a nightly basis  Call us  with significant concerns  The download from your machine shows that is working well

## 2023-12-12 ENCOUNTER — Other Ambulatory Visit: Payer: Self-pay | Admitting: Family Medicine

## 2023-12-12 DIAGNOSIS — Z1231 Encounter for screening mammogram for malignant neoplasm of breast: Secondary | ICD-10-CM

## 2023-12-19 ENCOUNTER — Ambulatory Visit
Admission: RE | Admit: 2023-12-19 | Discharge: 2023-12-19 | Disposition: A | Discharge status: Home / Self Care | Source: Ambulatory Visit | Attending: Family Medicine | Admitting: Family Medicine

## 2023-12-19 DIAGNOSIS — Z1231 Encounter for screening mammogram for malignant neoplasm of breast: Secondary | ICD-10-CM | POA: Diagnosis not present

## 2024-02-22 DIAGNOSIS — G4733 Obstructive sleep apnea (adult) (pediatric): Secondary | ICD-10-CM | POA: Diagnosis not present

## 2024-03-18 DIAGNOSIS — G4733 Obstructive sleep apnea (adult) (pediatric): Secondary | ICD-10-CM | POA: Diagnosis not present
